# Patient Record
Sex: Male | Born: 1935 | Race: White | Hispanic: No | Smoking: Never smoker
Health system: Southern US, Community
[De-identification: ages and names within clinical notes are randomized; demographics above are authoritative.]

## PROBLEM LIST (undated history)

## (undated) DIAGNOSIS — R42 Dizziness and giddiness: Secondary | ICD-10-CM

## (undated) DIAGNOSIS — I10 Essential (primary) hypertension: Secondary | ICD-10-CM

## (undated) DIAGNOSIS — M1990 Unspecified osteoarthritis, unspecified site: Secondary | ICD-10-CM

## (undated) DIAGNOSIS — E785 Hyperlipidemia, unspecified: Secondary | ICD-10-CM

## (undated) DIAGNOSIS — E119 Type 2 diabetes mellitus without complications: Secondary | ICD-10-CM

## (undated) DIAGNOSIS — R05 Cough: Secondary | ICD-10-CM

## (undated) DIAGNOSIS — R059 Cough, unspecified: Secondary | ICD-10-CM

## (undated) DIAGNOSIS — Z87442 Personal history of urinary calculi: Secondary | ICD-10-CM

## (undated) DIAGNOSIS — C801 Malignant (primary) neoplasm, unspecified: Secondary | ICD-10-CM

## (undated) HISTORY — DX: Hyperlipidemia, unspecified: E78.5

## (undated) HISTORY — DX: Type 2 diabetes mellitus without complications: E11.9

## (undated) HISTORY — PX: OTHER SURGICAL HISTORY: SHX169

## (undated) HISTORY — DX: Essential (primary) hypertension: I10

---
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
## 2002-01-10 ENCOUNTER — Encounter: Payer: Self-pay | Admitting: Orthopedic Surgery

## 2002-01-10 ENCOUNTER — Ambulatory Visit (HOSPITAL_COMMUNITY): Admission: RE | Admit: 2002-01-10 | Discharge: 2002-01-10 | Payer: Self-pay | Admitting: Orthopedic Surgery

## 2002-12-02 ENCOUNTER — Encounter: Payer: Self-pay | Admitting: Orthopedic Surgery

## 2002-12-02 ENCOUNTER — Ambulatory Visit (HOSPITAL_COMMUNITY): Admission: RE | Admit: 2002-12-02 | Discharge: 2002-12-02 | Payer: Self-pay | Admitting: Orthopedic Surgery

## 2016-01-11 DIAGNOSIS — I10 Essential (primary) hypertension: Secondary | ICD-10-CM | POA: Insufficient documentation

## 2016-01-11 DIAGNOSIS — N183 Chronic kidney disease, stage 3 unspecified: Secondary | ICD-10-CM | POA: Insufficient documentation

## 2016-01-11 DIAGNOSIS — E1122 Type 2 diabetes mellitus with diabetic chronic kidney disease: Secondary | ICD-10-CM | POA: Insufficient documentation

## 2016-01-11 DIAGNOSIS — E538 Deficiency of other specified B group vitamins: Secondary | ICD-10-CM

## 2016-01-11 DIAGNOSIS — E782 Mixed hyperlipidemia: Secondary | ICD-10-CM | POA: Insufficient documentation

## 2016-01-11 DIAGNOSIS — I452 Bifascicular block: Secondary | ICD-10-CM

## 2016-01-11 HISTORY — DX: Bifascicular block: I45.2

## 2016-01-11 HISTORY — DX: Deficiency of other specified B group vitamins: E53.8

## 2016-01-11 HISTORY — DX: Type 2 diabetes mellitus with diabetic chronic kidney disease: E11.22

## 2016-01-11 HISTORY — DX: Mixed hyperlipidemia: E78.2

## 2016-01-11 HISTORY — DX: Chronic kidney disease, stage 3 unspecified: N18.30

## 2016-01-11 HISTORY — DX: Essential (primary) hypertension: I10

## 2016-03-22 DIAGNOSIS — E6609 Other obesity due to excess calories: Secondary | ICD-10-CM

## 2016-03-22 DIAGNOSIS — M109 Gout, unspecified: Secondary | ICD-10-CM

## 2016-03-22 DIAGNOSIS — R972 Elevated prostate specific antigen [PSA]: Secondary | ICD-10-CM

## 2016-03-22 DIAGNOSIS — N400 Enlarged prostate without lower urinary tract symptoms: Secondary | ICD-10-CM

## 2016-03-22 HISTORY — DX: Elevated prostate specific antigen (PSA): R97.20

## 2016-03-22 HISTORY — DX: Benign prostatic hyperplasia without lower urinary tract symptoms: N40.0

## 2016-03-22 HISTORY — DX: Gout, unspecified: M10.9

## 2016-03-22 HISTORY — DX: Other obesity due to excess calories: E66.09

## 2017-09-27 ENCOUNTER — Ambulatory Visit (INDEPENDENT_AMBULATORY_CARE_PROVIDER_SITE_OTHER): Payer: Medicare Other | Admitting: Cardiology

## 2017-09-27 ENCOUNTER — Encounter: Payer: Self-pay | Admitting: Cardiology

## 2017-09-27 VITALS — BP 138/76 | HR 64 | Ht 69.0 in | Wt 236.1 lb

## 2017-09-27 DIAGNOSIS — N183 Chronic kidney disease, stage 3 unspecified: Secondary | ICD-10-CM

## 2017-09-27 DIAGNOSIS — E782 Mixed hyperlipidemia: Secondary | ICD-10-CM

## 2017-09-27 DIAGNOSIS — E1122 Type 2 diabetes mellitus with diabetic chronic kidney disease: Secondary | ICD-10-CM

## 2017-09-27 DIAGNOSIS — I452 Bifascicular block: Secondary | ICD-10-CM | POA: Diagnosis not present

## 2017-09-27 DIAGNOSIS — R079 Chest pain, unspecified: Secondary | ICD-10-CM | POA: Diagnosis not present

## 2017-09-27 DIAGNOSIS — R0789 Other chest pain: Secondary | ICD-10-CM

## 2017-09-27 DIAGNOSIS — I10 Essential (primary) hypertension: Secondary | ICD-10-CM

## 2017-09-27 HISTORY — DX: Other chest pain: R07.89

## 2017-09-27 NOTE — Patient Instructions (Signed)
Medication Instructions:  Your physician recommends that you continue on your current medications as directed. Please refer to the Current Medication list given to you today.  1. Avoid all over-the-counter antihistamines except Claritin/Loratadine and Zyrtec/Cetrizine. 2. Avoid all combination including cold sinus allergies flu decongestant and sleep medications 3. You can use Robitussin DM Mucinex and Mucinex DM for cough. 4. can use Tylenol aspirin ibuprofen and naproxen but no combinations such as sleep or sinus.  Labwork: None   Testing/Procedures: Your physician has requested that you have a lexiscan myoview. For further information please visit [email or web address]. Please follow instruction sheet, as given.  Please report to [address] [place], [place] the day of your testing.   Your physician has requested that you have an echocardiogram. Echocardiography is a painless test that uses sound waves to create images of your heart. It provides your doctor with information about the size and shape of your heart and how well your heart's chambers and valves are working. This procedure takes approximately one hour. There are no restrictions for this procedure.  Please report to [address] [place], [place] the day of your testing.   Follow-Up: Your physician recommends that you schedule a follow-up appointment in: 1 month   Any Other Special Instructions Will Be Listed Below (If Applicable).  Please note that any paperwork needing to be filled out by the provider will need to be addressed at the front desk prior to seeing the provider. Please note that any paperwork FMLA, Disability or other documents regarding health condition is subject to a $25.00 charge that must be received prior to completion of paperwork in the form of a money order or check.    If you need a refill on your cardiac medications before your next appointment, please call your  pharmacy.

## 2017-09-27 NOTE — Progress Notes (Signed)
Cardiology Consultation:    Date:  09/27/2017   ID:  Gwyndolyn Saxon Vita, DOB 01-Jun-1931, MRN [id number]  PCP:  Raina Mina., MD  Cardiologist:  Jenne Campus, MD   Referring MD: Raina Mina., MD   Chief Complaint  Patient presents with  . New Patient (Initial Visit)  I need hip surgery  History of Present Illness:    Phillip Neal is a 83 y.o. male who is being seen today for the evaluation of cardiac issue before hip surgery at the request of Raina Mina., MD.  Patient does have multiple risk factors for coronary artery disease.  Namely dyslipidemia with intolerance to statin, diabetes, chronic kidney disease, hypertension.  He required hip surgery he is here to talk about risk factors and evaluation before that.  Denies having any typical cardiac symptoms but anytime he works hard he may feel his heart spitting up some does describe some uneasy sensation in the chest he does not use expression chest pain presently is a sensation that happened when he try to work hard.  This been going on for years anything is related to lack of drinking fluids when he does it which she could be right but obviously with his risk factors for coronary artery disease we need to consider evaluating for CAD.  Past Medical History:  Diagnosis Date  . Chronic kidney disease   . Diabetes mellitus without complication ([place])   . Hyperlipidemia   . Hypertension     Past Surgical History:  Procedure Laterality Date  . HERNIA REPAIR      Current Medications: Current Meds  Medication Sig  . aspirin 81 MG chewable tablet Chew 81 mg by mouth daily.  . clotrimazole-betamethasone (LOTRISONE) cream Apply 1 application topically 2 (two) times daily.  . Coenzyme Q10 (CO Q 10) 10 MG CAPS Take 10 mg by mouth 2 (two) times daily.   . hydrochlorothiazide (HYDRODIURIL) 12.5 MG tablet Take 1 tablet by mouth daily.  Marland Kitchen lisinopril (PRINIVIL,ZESTRIL) 40 MG tablet Take 1 tablet by mouth daily.  .  metFORMIN (GLUCOPHAGE-XR) 500 MG 24 hr tablet Take 2 tablets by mouth 2 (two) times daily.  . vitamin B-12 (CYANOCOBALAMIN) 1000 MCG tablet Take 1 tablet by mouth daily.     Allergies:   Ciprofloxacin; Statins; and Sulfamethoxazole   Social History   Social History  . Marital status: Married    Spouse name: N/A  . Number of children: N/A  . Years of education: N/A   Social History Main Topics  . Smoking status: Never Smoker  . Smokeless tobacco: Never Used  . Alcohol use No  . Drug use: No  . Sexual activity: Not Asked   Other Topics Concern  . None   Social History Narrative  . None     Family History: [name] family history includes Heart failure in his mother. ROS:   Please see the history of present illness.    All 14 point review of systems negative except as described per history of present illness.  EKGs/Labs/Other Studies Reviewed:    The following studies were reviewed today:     Recent Labs: No results found for requested labs within last 8760 hours.  Recent Lipid Panel No results found for: CHOL, TRIG, HDL, CHOLHDL, VLDL, LDLCALC, LDLDIRECT  Physical Exam:    VS:  BP 138/76   Pulse 64   Ht 5\' 9"  (1.753 m)   Wt 236 lb 1.9 oz (107.1 kg)   SpO2 95%  BMI 34.87 kg/m     Wt Readings from Last 3 Encounters:  09/27/17 236 lb 1.9 oz (107.1 kg)     GEN:  Well nourished, well developed in no acute distress HEENT: Normal NECK: No JVD; No carotid bruits LYMPHATICS: No lymphadenopathy CARDIAC: RRR, no murmurs, no rubs, no gallops RESPIRATORY:  Clear to auscultation without rales, wheezing or rhonchi  ABDOMEN: Soft, non-tender, non-distended MUSCULOSKELETAL:  No edema; No deformity  SKIN: Warm and dry NEUROLOGIC:  Alert and oriented x 3 PSYCHIATRIC:  Normal affect   ASSESSMENT:    1. Bifascicular bundle branch block   2. Essential hypertension   3. Diabetes mellitus with stage 3 chronic kidney disease ([place])   4. Mixed hyperlipidemia     5. Atypical chest pain    PLAN:    In order of problems listed above:  1. Bifascicular bundle branch block: We will do EKG to confirm the rhythm.  I would ask him to have echo to check left ventricular ejection fraction 2. Essential hypertension: Well-controlled continue present management. 3. Diabetes: Well controlled I will retrieve laboratory test from primary care physician 4. Dyslipidemia: He was always told that his course was good but he was transported because of diabetes and he was not able to tolerate it.  Will retrieve results of his fasting lipid profile 5. Atypical chest pain: Stress test will be done as a part of evaluation before surgery.   Medication Adjustments/Labs and Tests Ordered: Current medicines are reviewed at length with the patient today.  Concerns regarding medicines are outlined above.  No orders of the defined types were placed in this encounter.  No orders of the defined types were placed in this encounter.   Signed, Park Liter, MD, [facility]. 09/27/2017 10:23 AM    [facility]

## 2017-10-02 ENCOUNTER — Telehealth (HOSPITAL_COMMUNITY): Payer: Self-pay | Admitting: *Deleted

## 2017-10-02 NOTE — Telephone Encounter (Signed)
Patient given detailed instructions per Myocardial Perfusion Study Information Sheet for the test on 10/05/17 at 1000. Patient notified to arrive 15 minutes early and that it is imperative to arrive on time for appointment to keep from having the test rescheduled.  If you need to cancel or reschedule your appointment, please call the office within 24 hours of your appointment. . Patient verbalized understanding.Phillip Neal, [name]

## 2017-10-05 ENCOUNTER — Ambulatory Visit (HOSPITAL_COMMUNITY): Payer: Medicare Other | Attending: Cardiology

## 2017-10-05 ENCOUNTER — Ambulatory Visit (HOSPITAL_BASED_OUTPATIENT_CLINIC_OR_DEPARTMENT_OTHER): Payer: Medicare Other

## 2017-10-05 ENCOUNTER — Other Ambulatory Visit: Payer: Self-pay

## 2017-10-05 DIAGNOSIS — E782 Mixed hyperlipidemia: Secondary | ICD-10-CM

## 2017-10-05 DIAGNOSIS — I77810 Thoracic aortic ectasia: Secondary | ICD-10-CM | POA: Diagnosis not present

## 2017-10-05 DIAGNOSIS — N189 Chronic kidney disease, unspecified: Secondary | ICD-10-CM | POA: Insufficient documentation

## 2017-10-05 DIAGNOSIS — Z8249 Family history of ischemic heart disease and other diseases of the circulatory system: Secondary | ICD-10-CM | POA: Insufficient documentation

## 2017-10-05 DIAGNOSIS — R9439 Abnormal result of other cardiovascular function study: Secondary | ICD-10-CM | POA: Insufficient documentation

## 2017-10-05 DIAGNOSIS — E785 Hyperlipidemia, unspecified: Secondary | ICD-10-CM | POA: Insufficient documentation

## 2017-10-05 DIAGNOSIS — R079 Chest pain, unspecified: Secondary | ICD-10-CM

## 2017-10-05 DIAGNOSIS — E1122 Type 2 diabetes mellitus with diabetic chronic kidney disease: Secondary | ICD-10-CM | POA: Diagnosis not present

## 2017-10-05 DIAGNOSIS — Z01818 Encounter for other preprocedural examination: Secondary | ICD-10-CM | POA: Diagnosis not present

## 2017-10-05 DIAGNOSIS — I129 Hypertensive chronic kidney disease with stage 1 through stage 4 chronic kidney disease, or unspecified chronic kidney disease: Secondary | ICD-10-CM | POA: Insufficient documentation

## 2017-10-05 DIAGNOSIS — I4510 Unspecified right bundle-branch block: Secondary | ICD-10-CM | POA: Insufficient documentation

## 2017-10-05 DIAGNOSIS — R0789 Other chest pain: Secondary | ICD-10-CM | POA: Insufficient documentation

## 2017-10-05 LAB — MYOCARDIAL PERFUSION IMAGING
CHL CUP NUCLEAR SDS: 0
CHL CUP NUCLEAR SSS: 17
CSEPPHR: 75 {beats}/min
LHR: 0.3
LV dias vol: 109 mL (ref 62–150)
LV sys vol: 52 mL
Rest HR: 63 {beats}/min
SRS: 17
TID: 0.88

## 2017-10-05 LAB — ECHOCARDIOGRAM COMPLETE
Height: 69 in
WEIGHTICAEL: 3776 [oz_av]

## 2017-10-05 MED ORDER — REGADENOSON 0.4 MG/5ML IV SOLN
0.4000 mg | Freq: Once | INTRAVENOUS | Status: AC
  Administered 2017-10-05: 0.4 mg via INTRAVENOUS

## 2017-10-05 MED ORDER — TECHNETIUM TC 99M TETROFOSMIN IV KIT
29.0000 | PACK | Freq: Once | INTRAVENOUS | Status: AC | PRN
  Administered 2017-10-05: 29 via INTRAVENOUS
  Filled 2017-10-05: qty 29

## 2017-10-05 MED ORDER — TECHNETIUM TC 99M TETROFOSMIN IV KIT
10.2000 | PACK | Freq: Once | INTRAVENOUS | Status: AC | PRN
  Administered 2017-10-05: 10.2 via INTRAVENOUS
  Filled 2017-10-05: qty 11

## 2017-10-17 MED ORDER — NITROGLYCERIN 0.4 MG SL SUBL
SUBLINGUAL_TABLET | SUBLINGUAL | Status: AC
  Filled 2017-10-17: qty 1

## 2017-10-17 MED ORDER — METOPROLOL TARTRATE 5 MG/5ML IV SOLN
INTRAVENOUS | Status: AC
  Filled 2017-10-17: qty 5

## 2017-10-27 ENCOUNTER — Encounter: Payer: Self-pay | Admitting: Cardiology

## 2017-10-27 ENCOUNTER — Ambulatory Visit (INDEPENDENT_AMBULATORY_CARE_PROVIDER_SITE_OTHER): Payer: Medicare Other | Admitting: Cardiology

## 2017-10-27 VITALS — BP 130/58 | HR 64 | Resp 12 | Ht 69.0 in | Wt 231.8 lb

## 2017-10-27 DIAGNOSIS — N183 Chronic kidney disease, stage 3 unspecified: Secondary | ICD-10-CM

## 2017-10-27 DIAGNOSIS — I452 Bifascicular block: Secondary | ICD-10-CM

## 2017-10-27 DIAGNOSIS — E1122 Type 2 diabetes mellitus with diabetic chronic kidney disease: Secondary | ICD-10-CM | POA: Diagnosis not present

## 2017-10-27 DIAGNOSIS — E782 Mixed hyperlipidemia: Secondary | ICD-10-CM

## 2017-10-27 DIAGNOSIS — I10 Essential (primary) hypertension: Secondary | ICD-10-CM | POA: Diagnosis not present

## 2017-10-27 DIAGNOSIS — R0789 Other chest pain: Secondary | ICD-10-CM | POA: Diagnosis not present

## 2017-10-27 MED ORDER — EZETIMIBE 10 MG PO TABS: 10.0000 mg | ORAL_TABLET | Freq: Every day | ORAL | 3 refills | Status: DC

## 2017-10-27 NOTE — Progress Notes (Signed)
Cardiology Office Note:    Date:  10/27/2017   ID:  Phillip Neal, DOB 1931/03/18, MRN [id number]  PCP:  Raina Mina., MD  Cardiologist:  Jenne Campus, MD    Referring MD: Raina Mina., MD   Chief Complaint  Patient presents with  . Follow up on testing  Doing well  History of Present Illness:    Phillip Neal is a 83 y.o. male with multiple risk factors for coronary artery disease.  He was sent to [place] for evaluation before scheduled hip replacement surgery.  He did have echocardiogram which showed normal left ventricular ejection fraction, he also had a stress test which showed no evidence of ischemia there is some questionable old myocardial infarction on the stress test however again echo showed normal left ventricular ejection fraction.  He does have bifascicular block luckily he is asymptomatic there is no syncope dizziness or passing out.  His ability to exercise is limited because of chronic hip pain.  He used to be very active used to go to gym on the regular basis but because of pain in his knee cannot do it but he tells me that just a few days ago he was blowing leaves for about 4 hours and had no difficulty doing it.  I think what we need to do now is to reorientate herself and basically try to modify his risk factors for coronary artery disease.  He is intolerant to statin however I think he can benefit from Zetia I will give him prescription for 10 mg daily.  Past Medical History:  Diagnosis Date  . Chronic kidney disease   . Diabetes mellitus without complication ([place])   . Hyperlipidemia   . Hypertension     Past Surgical History:  Procedure Laterality Date  . HERNIA REPAIR      Current Medications: Current Meds  Medication Sig  . aspirin 81 MG chewable tablet Chew 81 mg by mouth daily.  . clotrimazole-betamethasone (LOTRISONE) cream Apply 1 application topically 2 (two) times daily.  . Coenzyme Q10 (CO Q 10) 10 MG CAPS Take 10 mg by mouth 2  (two) times daily.   . hydrochlorothiazide (HYDRODIURIL) 12.5 MG tablet Take 1 tablet by mouth daily.  Marland Kitchen lisinopril (PRINIVIL,ZESTRIL) 40 MG tablet Take 1 tablet by mouth daily.  . metFORMIN (GLUCOPHAGE-XR) 500 MG 24 hr tablet Take 2 tablets by mouth 2 (two) times daily.  . vitamin B-12 (CYANOCOBALAMIN) 1000 MCG tablet Take 1 tablet by mouth daily.     Allergies:   Ciprofloxacin; Statins; and Sulfamethoxazole   Social History   Socioeconomic History  . Marital status: Married    Spouse name: None  . Number of children: None  . Years of education: None  . Highest education level: None  Social Needs  . Financial resource strain: None  . Food insecurity - worry: None  . Food insecurity - inability: None  . Transportation needs - medical: None  . Transportation needs - non-medical: None  Occupational History  . None  Tobacco Use  . Smoking status: Never Smoker  . Smokeless tobacco: Never Used  Substance and Sexual Activity  . Alcohol use: No  . Drug use: No  . Sexual activity: None  Other Topics Concern  . None  Social History Narrative  . None     Family History: [name] family history includes Heart failure in his mother. ROS:   Please see the history of present illness.    All 14 point review of  systems negative except as described per history of present illness  EKGs/Labs/Other Studies Reviewed:      Recent Labs: No results found for requested labs within last 8760 hours.  Recent Lipid Panel No results found for: CHOL, TRIG, HDL, CHOLHDL, VLDL, LDLCALC, LDLDIRECT  Physical Exam:    VS:  BP (!) 130/58   Pulse 64   Resp 12   Ht 5\' 9"  (1.753 m)   Wt 231 lb 12.8 oz (105.1 kg)   BMI 34.23 kg/m     Wt Readings from Last 3 Encounters:  10/27/17 231 lb 12.8 oz (105.1 kg)  10/05/17 236 lb (107 kg)  09/27/17 236 lb 1.9 oz (107.1 kg)     GEN:  Well nourished, well developed in no acute distress HEENT: Normal NECK: No JVD; No carotid  bruits LYMPHATICS: No lymphadenopathy CARDIAC: RRR, no murmurs, no rubs, no gallops RESPIRATORY:  Clear to auscultation without rales, wheezing or rhonchi  ABDOMEN: Soft, non-tender, non-distended MUSCULOSKELETAL:  No edema; No deformity  SKIN: Warm and dry LOWER EXTREMITIES: no swelling NEUROLOGIC:  Alert and oriented x 3 PSYCHIATRIC:  Normal affect   ASSESSMENT:    1. Bifascicular bundle branch block   2. Essential hypertension   3. Diabetes mellitus with stage 3 chronic kidney disease ([place])   4. Mixed hyperlipidemia   5. Atypical chest pain    PLAN:    In order of problems listed above:  1. Bifascicular bundle branch block: I will ask him to wear Holter monitor to make sure there is no significant worsening of this problem especially during the night. 2. Essential hypertension: Doing well from that point of view we will continue present management. 3. Diabetes: Following by primary care physician apparently stable doing well. 4. His lipidemia will initiate Zetia 10 mg daily. 5. Atypical chest pain denies having any, stress test negative.  If Holter monitor comes without significant abnormality he will be acceptable risk for surgery from cardiac standpoint of view.   Medication Adjustments/Labs and Tests Ordered: Current medicines are reviewed at length with the patient today.  Concerns regarding medicines are outlined above.  No orders of the defined types were placed in this encounter.  Medication changes: No orders of the defined types were placed in this encounter.   Signed, Park Liter, MD, [facility] 10/27/2017 10:06 AM    [place]

## 2017-10-27 NOTE — Patient Instructions (Addendum)
Medication Instructions:  Your physician has recommended you make the following change in your medication:  1) Start taking Zetia 10 mg daily. Dr. Agustin Cree has sent this to your pharmacy  Labwork: None ordered  Testing/Procedures: Your physician has recommended that you wear a holter monitor. Holter monitors are medical devices that record the heart's electrical activity. Doctors most often use these monitors to diagnose arrhythmias. Arrhythmias are problems with the speed or rhythm of the heartbeat. The monitor is a small, portable device. You can wear one while you do your normal daily activities. This is usually used to diagnose what is causing palpitations/syncope (passing out).  You will wear this monitor for 24 hours.  Follow-Up: Your physician recommends that you schedule a follow-up appointment in: 3 months with Dr. Agustin Cree   Any Other Special Instructions Will Be Listed Below (If Applicable).     If you need a refill on your cardiac medications before your next appointment, please call your pharmacy.

## 2017-11-10 ENCOUNTER — Ambulatory Visit: Payer: Self-pay | Admitting: Orthopedic Surgery
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
## 2017-11-27 ENCOUNTER — Other Ambulatory Visit (HOSPITAL_COMMUNITY): Payer: Self-pay | Admitting: Emergency Medicine

## 2017-11-27 NOTE — Progress Notes (Signed)
LOV CARDIOLOGY DR Jenne Campus 10-27-17 Epic  STRESS TEST 10-05-17 Epic  ECHO 10-05-17 Epic   HOLTER MONITOR RESULT 10-31-17 Epic  EKG 09-22-17 ON CHART [place]  LOV DR GREG GRISSON 10-31-17 Epic  LOV DR Marya Amsler GRISSO 09-22-17 ON CHART/EPIC

## 2017-11-27 NOTE — Patient Instructions (Addendum)
Phillip Neal  11/27/2017   Your procedure is scheduled on: 12-06-17  Report to [facility] Main  Entrance    Report to admitting at 9:30AM   Call this number if you have problems the morning of surgery [phone]    Remember: ONLY 1 PERSON MAY GO WITH YOU TO SHORT STAY TO GET  READY MORNING OF YOUR SURGERY.  Do not eat food or drink liquids :After Midnight.     Take these medicines the morning of surgery with A SIP OF WATER: NONE                                You may not have any metal on your body including hair pins and              piercings  Do not wear jewelry, make-up, lotions, powders or perfumes, deodorant                 Men may shave face and neck.   Do not bring valuables to the hospital. [place].  Contacts, dentures or bridgework may not be worn into surgery.  Leave suitcase in the car. After surgery it may be brought to your room.                 Please read over the following fact sheets you were given: _____________________________________________________________________             How to Manage Your Diabetes Before and After Surgery  Why is it important to control my blood sugar before and after surgery? . Improving blood sugar levels before and after surgery helps healing and can limit problems. . A way of improving blood sugar control is eating a healthy diet by: o  Eating less sugar and carbohydrates o  Increasing activity/exercise o  Talking with your doctor about reaching your blood sugar goals . High blood sugars (greater than 180 mg/dL) can raise your risk of infections and slow your recovery, so you will need to focus on controlling your diabetes during the weeks before surgery. . Make sure that the doctor who takes care of your diabetes knows about your planned surgery including the date and location.  How do I manage my blood sugar before surgery? . Check your  blood sugar at least 4 times a day, starting 2 days before surgery, to make sure that the level is not too high or low. o Check your blood sugar the morning of your surgery when you wake up and every 2 hours until you get to the Short Stay unit. . If your blood sugar is less than 70 mg/dL, you will need to treat for low blood sugar: o Do not take insulin. o Treat a low blood sugar (less than 70 mg/dL) with  cup of clear juice (cranberry or apple), 4 glucose tablets, OR glucose gel. o Recheck blood sugar in 15 minutes after treatment (to make sure it is greater than 70 mg/dL). If your blood sugar is not greater than 70 mg/dL on recheck, call [phone] for further instructions. . Report your blood sugar to the short stay nurse when you get to Short Stay.  . If you are admitted to the hospital after surgery: o Your  blood sugar will be checked by the staff and you will probably be given insulin after surgery (instead of oral diabetes medicines) to make sure you have good blood sugar levels. o The goal for blood sugar control after surgery is 80-180 mg/dL.   WHAT DO I DO ABOUT MY DIABETES MEDICATION?   . THE DAY BEFORE SURGERY, TAKE METFORMIN AS NORMAL       . THE MORNING OF SURGERY, DO NOT TAKE ANY DIABETIC MEDICATIONS !!   Patient Signature:  Date:   Nurse Signature:  Date:   Reviewed and Endorsed by [facility] Patient Education Committee, August 2015    [facility] - Preparing for Surgery Before surgery, you can play an important role.  Because skin is not sterile, your skin needs to be as free of germs as possible.  You can reduce the number of germs on your skin by washing with CHG (chlorahexidine gluconate) soap before surgery.  CHG is an antiseptic cleaner which kills germs and bonds with the skin to continue killing germs even after washing. Please DO NOT use if you have an allergy to CHG or antibacterial soaps.  If your skin becomes reddened/irritated stop using the CHG and  inform your nurse when you arrive at Short Stay. Do not shave (including legs and underarms) for at least 48 hours prior to the first CHG shower.  You may shave your face/neck. Please follow these instructions carefully:  1.  Shower with CHG Soap the night before surgery and the  morning of Surgery.  2.  If you choose to wash your hair, wash your hair first as usual with your  normal  shampoo.  3.  After you shampoo, rinse your hair and body thoroughly to remove the  shampoo.                           4.  Use CHG as you would any other liquid soap.  You can apply chg directly  to the skin and wash                       Gently with a scrungie or clean washcloth.  5.  Apply the CHG Soap to your body ONLY FROM THE NECK DOWN.   Do not use on face/ open                           Wound or open sores. Avoid contact with eyes, ears mouth and genitals (private parts).                       Wash face,  Genitals (private parts) with your normal soap.             6.  Wash thoroughly, paying special attention to the area where your surgery  will be performed.  7.  Thoroughly rinse your body with warm water from the neck down.  8.  DO NOT shower/wash with your normal soap after using and rinsing off  the CHG Soap.                9.  Pat yourself dry with a clean towel.            10.  Wear clean pajamas.            11.  Place clean sheets on your bed the night of your  first shower and do not  sleep with pets. Day of Surgery : Do not apply any lotions/deodorants the morning of surgery.  Please wear clean clothes to the hospital/surgery center.  FAILURE TO FOLLOW THESE INSTRUCTIONS MAY RESULT IN THE CANCELLATION OF YOUR SURGERY PATIENT SIGNATURE_________________________________  NURSE SIGNATURE__________________________________  ________________________________________________________________________   Phillip Neal  An incentive spirometer is a tool that can help keep your lungs clear and  active. This tool measures how well you are filling your lungs with each breath. Taking long deep breaths may help reverse or decrease the chance of developing breathing (pulmonary) problems (especially infection) following:  A long period of time when you are unable to move or be active. BEFORE THE PROCEDURE   If the spirometer includes an indicator to show your best effort, your nurse or respiratory therapist will set it to a desired goal.  If possible, sit up straight or lean slightly forward. Try not to slouch.  Hold the incentive spirometer in an upright position. INSTRUCTIONS FOR USE  1. Sit on the edge of your bed if possible, or sit up as far as you can in bed or on a chair. 2. Hold the incentive spirometer in an upright position. 3. Breathe out normally. 4. Place the mouthpiece in your mouth and seal your lips tightly around it. 5. Breathe in slowly and as deeply as possible, raising the piston or the ball toward the top of the column. 6. Hold your breath for 3-5 seconds or for as long as possible. Allow the piston or ball to fall to the bottom of the column. 7. Remove the mouthpiece from your mouth and breathe out normally. 8. Rest for a few seconds and repeat Steps 1 through 7 at least 10 times every 1-2 hours when you are awake. Take your time and take a few normal breaths between deep breaths. 9. The spirometer may include an indicator to show your best effort. Use the indicator as a goal to work toward during each repetition. 10. After each set of 10 deep breaths, practice coughing to be sure your lungs are clear. If you have an incision (the cut made at the time of surgery), support your incision when coughing by placing a pillow or rolled up towels firmly against it. Once you are able to get out of bed, walk around indoors and cough well. You may stop using the incentive spirometer when instructed by your caregiver.  RISKS AND COMPLICATIONS  Take your time so you do not get  dizzy or light-headed.  If you are in pain, you may need to take or ask for pain medication before doing incentive spirometry. It is harder to take a deep breath if you are having pain. AFTER USE  Rest and breathe slowly and easily.  It can be helpful to keep track of a log of your progress. Your caregiver can provide you with a simple table to help with this. If you are using the spirometer at home, follow these instructions: [place] IF:   You are having difficultly using the spirometer.  You have trouble using the spirometer as often as instructed.  Your pain medication is not giving enough relief while using the spirometer.  You develop fever of 100.5 F (38.1 C) or higher. SEEK IMMEDIATE MEDICAL CARE IF:   You cough up bloody sputum that had not been present before.  You develop fever of 102 F (38.9 C) or greater.  You develop worsening pain at or near the incision  site. MAKE SURE YOU:   Understand these instructions.  Will watch your condition.  Will get help right away if you are not doing well or get worse. Document Released: 03/27/2007 Document Revised: 02/06/2012 Document Reviewed: 05/28/2007 ExitCare Patient Information 2014 ExitCare, [place].   ________________________________________________________________________  WHAT IS A BLOOD TRANSFUSION? Blood Transfusion Information  A transfusion is the replacement of blood or some of its parts. Blood is made up of multiple cells which provide different functions.  Red blood cells carry oxygen and are used for blood loss replacement.  White blood cells fight against infection.  Platelets control bleeding.  Plasma helps clot blood.  Other blood products are available for specialized needs, such as hemophilia or other clotting disorders. BEFORE THE TRANSFUSION  Who gives blood for transfusions?   Healthy volunteers who are fully evaluated to make sure their blood is safe. This is blood bank  blood. Transfusion therapy is the safest it has ever been in the practice of medicine. Before blood is taken from a donor, a complete history is taken to make sure that person has no history of diseases nor engages in risky social behavior (examples are intravenous drug use or sexual activity with multiple partners). The donor's travel history is screened to minimize risk of transmitting infections, such as malaria. The donated blood is tested for signs of infectious diseases, such as HIV and hepatitis. The blood is then tested to be sure it is compatible with you in order to minimize the chance of a transfusion reaction. If you or a relative donates blood, this is often done in anticipation of surgery and is not appropriate for emergency situations. It takes many days to process the donated blood. RISKS AND COMPLICATIONS Although transfusion therapy is very safe and saves many lives, the main dangers of transfusion include:   Getting an infectious disease.  Developing a transfusion reaction. This is an allergic reaction to something in the blood you were given. Every precaution is taken to prevent this. The decision to have a blood transfusion has been considered carefully by your caregiver before blood is given. Blood is not given unless the benefits outweigh the risks. AFTER THE TRANSFUSION  Right after receiving a blood transfusion, you will usually feel much better and more energetic. This is especially true if your red blood cells have gotten low (anemic). The transfusion raises the level of the red blood cells which carry oxygen, and this usually causes an energy increase.  The nurse administering the transfusion will monitor you carefully for complications. HOME CARE INSTRUCTIONS  No special instructions are needed after a transfusion. You may find your energy is better. Speak with your caregiver about any limitations on activity for underlying diseases you may have. SEEK MEDICAL CARE IF:    Your condition is not improving after your transfusion.  You develop redness or irritation at the intravenous (IV) site. SEEK IMMEDIATE MEDICAL CARE IF:  Any of the following symptoms occur over the next 12 hours:  Shaking chills.  You have a temperature by mouth above 102 F (38.9 C), not controlled by medicine.  Chest, back, or muscle pain.  People around you feel you are not acting correctly or are confused.  Shortness of breath or difficulty breathing.  Dizziness and fainting.  You get a rash or develop hives.  You have a decrease in urine output.  Your urine turns a dark color or changes to pink, red, or brown. Any of the following symptoms occur over the next 10  days:  You have a temperature by mouth above 102 F (38.9 C), not controlled by medicine.  Shortness of breath.  Weakness after normal activity.  The white part of the eye turns yellow (jaundice).  You have a decrease in the amount of urine or are urinating less often.  Your urine turns a dark color or changes to pink, red, or brown. Document Released: 11/11/2000 Document Revised: 02/06/2012 Document Reviewed: 06/30/2008 [facility] Patient Information 2014 [place], [place].  _______________________________________________________________________

## 2017-11-30 ENCOUNTER — Encounter (HOSPITAL_COMMUNITY)
Admission: RE | Admit: 2017-11-30 | Discharge: 2017-11-30 | Disposition: A | Discharge status: Home / Self Care | Payer: Medicare Other | Source: Ambulatory Visit | Attending: Orthopedic Surgery | Admitting: Orthopedic Surgery

## 2018-01-19 ENCOUNTER — Ambulatory Visit: Payer: Self-pay | Admitting: Orthopedic Surgery
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
## 2018-01-24 NOTE — Progress Notes (Signed)
10-31-17 [place] Monitor  10-27-17 (Epic) Cardiac Clearance from Dr. Agustin Cree  09-22-17 (Epic) EKG  10-05-17 (Epic) ECHO, Stress

## 2018-01-24 NOTE — Patient Instructions (Addendum)
Phillip Neal  01/24/2018   Your procedure is scheduled on: 01-31-18   Report to [facility] Main  Entrance Report to Admitting at 11:00 AM    Call this number if you have problems the morning of surgery [phone]   Remember: Do not eat food or drink liquids :After Midnight. You may have a Clear Liquid Diet from Midnight until 7:30 AM. After 7:30 AM, nothing until after surgery.     CLEAR LIQUID DIET   Foods Allowed                                                                     Foods Excluded  Coffee and tea, regular and decaf                             liquids that you cannot  Plain Jell-O in any flavor                                             see through such as: Fruit ices (not with fruit pulp)                                     milk, soups, orange juice  Iced Popsicles                                    All solid food Carbonated beverages, regular and diet                                    Cranberry, grape and apple juices Sports drinks like Gatorade Lightly seasoned clear broth or consume(fat free) Sugar, honey syrup  Sample Menu Breakfast                                Lunch                                     Supper Cranberry juice                    Beef broth                            Chicken broth Jell-O                                     Grape juice                           Apple juice  Coffee or tea                        Jell-O                                      Popsicle                                                Coffee or tea                        Coffee or tea  _____________________________________________________________________     Take these medicines the morning of surgery with A SIP OF WATER: None                                You may not have any metal on your body including hair pins and              piercings  Do not wear jewelry, lotions, powders or deodorant             Men may shave face and  neck.   Do not bring valuables to the hospital. [place].  Contacts, dentures or bridgework may not be worn into surgery.  Leave suitcase in the car. After surgery it may be brought to your room.                Please read over the following fact sheets you were given: _____________________________________________________________________ How to Manage Your Diabetes Before and After Surgery  Why is it important to control my blood sugar before and after surgery? . Improving blood sugar levels before and after surgery helps healing and can limit problems. . A way of improving blood sugar control is eating a healthy diet by: o  Eating less sugar and carbohydrates o  Increasing activity/exercise o  Talking with your doctor about reaching your blood sugar goals . High blood sugars (greater than 180 mg/dL) can raise your risk of infections and slow your recovery, so you will need to focus on controlling your diabetes during the weeks before surgery. . Make sure that the doctor who takes care of your diabetes knows about your planned surgery including the date and location.  How do I manage my blood sugar before surgery? . Check your blood sugar at least 4 times a day, starting 2 days before surgery, to make sure that the level is not too high or low. o Check your blood sugar the morning of your surgery when you wake up and every 2 hours until you get to the Short Stay unit. . If your blood sugar is less than 70 mg/dL, you will need to treat for low blood sugar: o Do not take insulin. o Treat a low blood sugar (less than 70 mg/dL) with  cup of clear juice (cranberry or apple), 4 glucose tablets, OR glucose gel. o Recheck blood sugar in 15 minutes after treatment (to make sure it is greater than 70 mg/dL). If your blood sugar is not greater than 70 mg/dL on  recheck, call [phone] for further instructions. . Report your blood sugar to the  short stay nurse when you get to Short Stay.  . If you are admitted to the hospital after surgery: o Your blood sugar will be checked by the staff and you will probably be given insulin after surgery (instead of oral diabetes medicines) to make sure you have good blood sugar levels. o The goal for blood sugar control after surgery is 80-180 mg/dL.   WHAT DO I DO ABOUT MY DIABETES MEDICATION?  Marland Kitchen Do not take oral diabetes medicines (pills) the morning of surgery.  . THE DAY BEFORE SURGERY, take your usual dose of Metformin        Patient Signature:  Date:   Nurse Signature:  Date:   Reviewed and Endorsed by [facility] Patient Education Committee, August 2015         [facility] - Preparing for Surgery Before surgery, you can play an important role.  Because skin is not sterile, your skin needs to be as free of germs as possible.  You can reduce the number of germs on your skin by washing with CHG (chlorahexidine gluconate) soap before surgery.  CHG is an antiseptic cleaner which kills germs and bonds with the skin to continue killing germs even after washing. Please DO NOT use if you have an allergy to CHG or antibacterial soaps.  If your skin becomes reddened/irritated stop using the CHG and inform your nurse when you arrive at Short Stay. Do not shave (including legs and underarms) for at least 48 hours prior to the first CHG shower.  You may shave your face/neck. Please follow these instructions carefully:  1.  Shower with CHG Soap the night before surgery and the  morning of Surgery.  2.  If you choose to wash your hair, wash your hair first as usual with your  normal  shampoo.  3.  After you shampoo, rinse your hair and body thoroughly to remove the  shampoo.                           4.  Use CHG as you would any other liquid soap.  You can apply chg directly  to the skin and wash                       Gently with a scrungie or clean washcloth.  5.  Apply the CHG Soap to your body  ONLY FROM THE NECK DOWN.   Do not use on face/ open                           Wound or open sores. Avoid contact with eyes, ears mouth and genitals (private parts).                       Wash face,  Genitals (private parts) with your normal soap.             6.  Wash thoroughly, paying special attention to the area where your surgery  will be performed.  7.  Thoroughly rinse your body with warm water from the neck down.  8.  DO NOT shower/wash with your normal soap after using and rinsing off  the CHG Soap.                9.  Pat yourself dry with  a clean towel.            10.  Wear clean pajamas.            11.  Place clean sheets on your bed the night of your first shower and do not  sleep with pets. Day of Surgery : Do not apply any lotions/deodorants the morning of surgery.  Please wear clean clothes to the hospital/surgery center.  FAILURE TO FOLLOW THESE INSTRUCTIONS MAY RESULT IN THE CANCELLATION OF YOUR SURGERY PATIENT SIGNATURE_________________________________  NURSE SIGNATURE__________________________________  ________________________________________________________________________   Adam Phenix  An incentive spirometer is a tool that can help keep your lungs clear and active. This tool measures how well you are filling your lungs with each breath. Taking long deep breaths may help reverse or decrease the chance of developing breathing (pulmonary) problems (especially infection) following:  A long period of time when you are unable to move or be active. BEFORE THE PROCEDURE   If the spirometer includes an indicator to show your best effort, your nurse or respiratory therapist will set it to a desired goal.  If possible, sit up straight or lean slightly forward. Try not to slouch.  Hold the incentive spirometer in an upright position. INSTRUCTIONS FOR USE  1. Sit on the edge of your bed if possible, or sit up as far as you can in bed or on a chair. 2. Hold the  incentive spirometer in an upright position. 3. Breathe out normally. 4. Place the mouthpiece in your mouth and seal your lips tightly around it. 5. Breathe in slowly and as deeply as possible, raising the piston or the ball toward the top of the column. 6. Hold your breath for 3-5 seconds or for as long as possible. Allow the piston or ball to fall to the bottom of the column. 7. Remove the mouthpiece from your mouth and breathe out normally. 8. Rest for a few seconds and repeat Steps 1 through 7 at least 10 times every 1-2 hours when you are awake. Take your time and take a few normal breaths between deep breaths. 9. The spirometer may include an indicator to show your best effort. Use the indicator as a goal to work toward during each repetition. 10. After each set of 10 deep breaths, practice coughing to be sure your lungs are clear. If you have an incision (the cut made at the time of surgery), support your incision when coughing by placing a pillow or rolled up towels firmly against it. Once you are able to get out of bed, walk around indoors and cough well. You may stop using the incentive spirometer when instructed by your caregiver.  RISKS AND COMPLICATIONS  Take your time so you do not get dizzy or light-headed.  If you are in pain, you may need to take or ask for pain medication before doing incentive spirometry. It is harder to take a deep breath if you are having pain. AFTER USE  Rest and breathe slowly and easily.  It can be helpful to keep track of a log of your progress. Your caregiver can provide you with a simple table to help with this. If you are using the spirometer at home, follow these instructions: [place] IF:   You are having difficultly using the spirometer.  You have trouble using the spirometer as often as instructed.  Your pain medication is not giving enough relief while using the spirometer.  You develop fever of 100.5 F (38.1 C)  or  higher. SEEK IMMEDIATE MEDICAL CARE IF:   You cough up bloody sputum that had not been present before.  You develop fever of 102 F (38.9 C) or greater.  You develop worsening pain at or near the incision site. MAKE SURE YOU:   Understand these instructions.  Will watch your condition.  Will get help right away if you are not doing well or get worse. Document Released: 03/27/2007 Document Revised: 02/06/2012 Document Reviewed: 05/28/2007 Exit[employer] Patient Information 2014 Exit[employer], [place].   ________________________________________________________________________  WHAT IS A BLOOD TRANSFUSION? Blood Transfusion Information  A transfusion is the replacement of blood or some of its parts. Blood is made up of multiple cells which provide different functions.  Red blood cells carry oxygen and are used for blood loss replacement.  White blood cells fight against infection.  Platelets control bleeding.  Plasma helps clot blood.  Other blood products are available for specialized needs, such as hemophilia or other clotting disorders. BEFORE THE TRANSFUSION  Who gives blood for transfusions?   Healthy volunteers who are fully evaluated to make sure their blood is safe. This is blood bank blood. Transfusion therapy is the safest it has ever been in the practice of medicine. Before blood is taken from a donor, a complete history is taken to make sure that person has no history of diseases nor engages in risky social behavior (examples are intravenous drug use or sexual activity with multiple partners). The donor's travel history is screened to minimize risk of transmitting infections, such as malaria. The donated blood is tested for signs of infectious diseases, such as HIV and hepatitis. The blood is then tested to be sure it is compatible with you in order to minimize the chance of a transfusion reaction. If you or a relative donates blood, this is often done in anticipation of surgery  and is not appropriate for emergency situations. It takes many days to process the donated blood. RISKS AND COMPLICATIONS Although transfusion therapy is very safe and saves many lives, the main dangers of transfusion include:   Getting an infectious disease.  Developing a transfusion reaction. This is an allergic reaction to something in the blood you were given. Every precaution is taken to prevent this. The decision to have a blood transfusion has been considered carefully by your caregiver before blood is given. Blood is not given unless the benefits outweigh the risks. AFTER THE TRANSFUSION  Right after receiving a blood transfusion, you will usually feel much better and more energetic. This is especially true if your red blood cells have gotten low (anemic). The transfusion raises the level of the red blood cells which carry oxygen, and this usually causes an energy increase.  The nurse administering the transfusion will monitor you carefully for complications. HOME CARE INSTRUCTIONS  No special instructions are needed after a transfusion. You may find your energy is better. Speak with your caregiver about any limitations on activity for underlying diseases you may have. SEEK MEDICAL CARE IF:   Your condition is not improving after your transfusion.  You develop redness or irritation at the intravenous (IV) site. SEEK IMMEDIATE MEDICAL CARE IF:  Any of the following symptoms occur over the next 12 hours:  Shaking chills.  You have a temperature by mouth above 102 F (38.9 C), not controlled by medicine.  Chest, back, or muscle pain.  People around you feel you are not acting correctly or are confused.  Shortness of breath or difficulty breathing.  Dizziness and  fainting.  You get a rash or develop hives.  You have a decrease in urine output.  Your urine turns a dark color or changes to pink, red, or brown. Any of the following symptoms occur over the next 10  days:  You have a temperature by mouth above 102 F (38.9 C), not controlled by medicine.  Shortness of breath.  Weakness after normal activity.  The white part of the eye turns yellow (jaundice).  You have a decrease in the amount of urine or are urinating less often.  Your urine turns a dark color or changes to pink, red, or brown. Document Released: 11/11/2000 Document Revised: 02/06/2012 Document Reviewed: 06/30/2008 [facility] Patient Information 2014 [place], [place].  _______________________________________________________________________

## 2018-01-25 ENCOUNTER — Encounter (HOSPITAL_COMMUNITY): Payer: Self-pay

## 2018-01-25 ENCOUNTER — Encounter (HOSPITAL_COMMUNITY)
Admission: RE | Admit: 2018-01-25 | Discharge: 2018-01-25 | Disposition: A | Discharge status: Home / Self Care | Payer: Medicare Other | Source: Ambulatory Visit | Attending: Orthopedic Surgery | Admitting: Orthopedic Surgery

## 2018-01-25 ENCOUNTER — Other Ambulatory Visit: Payer: Self-pay

## 2018-01-25 DIAGNOSIS — Z01812 Encounter for preprocedural laboratory examination: Secondary | ICD-10-CM | POA: Insufficient documentation

## 2018-01-25 DIAGNOSIS — M1612 Unilateral primary osteoarthritis, left hip: Secondary | ICD-10-CM | POA: Insufficient documentation

## 2018-01-25 LAB — APTT: APTT: 28 s (ref 24–36)

## 2018-01-25 LAB — CBC
HEMATOCRIT: 44.7 % (ref 39.0–52.0)
HEMOGLOBIN: 14.8 g/dL (ref 13.0–17.0)
MCH: 33.4 pg (ref 26.0–34.0)
MCHC: 33.1 g/dL (ref 30.0–36.0)
MCV: 100.9 fL — AB (ref 78.0–100.0)
Platelets: 271 10*3/uL (ref 150–400)
RBC: 4.43 MIL/uL (ref 4.22–5.81)
RDW: 13.3 % (ref 11.5–15.5)
WBC: 12.1 10*3/uL — ABNORMAL HIGH (ref 4.0–10.5)

## 2018-01-25 LAB — HEMOGLOBIN A1C
HEMOGLOBIN A1C: 6.8 % — AB (ref 4.8–5.6)
Mean Plasma Glucose: 148.46 mg/dL

## 2018-01-25 LAB — PROTIME-INR
INR: 0.94
PROTHROMBIN TIME: 12.4 s (ref 11.4–15.2)

## 2018-01-25 LAB — ABO/RH: ABO/RH(D): O POS

## 2018-01-25 LAB — COMPREHENSIVE METABOLIC PANEL
ALK PHOS: 58 U/L (ref 38–126)
ALT: 18 U/L (ref 17–63)
ANION GAP: 7 (ref 5–15)
AST: 17 U/L (ref 15–41)
Albumin: 3.6 g/dL (ref 3.5–5.0)
BILIRUBIN TOTAL: 0.8 mg/dL (ref 0.3–1.2)
BUN: 30 mg/dL — AB (ref 6–20)
CALCIUM: 9.3 mg/dL (ref 8.9–10.3)
CO2: 24 mmol/L (ref 22–32)
Chloride: 108 mmol/L (ref 101–111)
Creatinine, Ser: 1.26 mg/dL — ABNORMAL HIGH (ref 0.61–1.24)
GFR calc Af Amer: 59 mL/min — ABNORMAL LOW (ref >60)
GFR, EST NON AFRICAN AMERICAN: 51 mL/min — AB (ref >60)
GLUCOSE: 109 mg/dL — AB (ref 65–99)
Potassium: 4.5 mmol/L (ref 3.5–5.1)
Sodium: 139 mmol/L (ref 135–145)
TOTAL PROTEIN: 6.6 g/dL (ref 6.5–8.1)

## 2018-01-25 LAB — SURGICAL PCR SCREEN
MRSA, PCR: NEGATIVE
Staphylococcus aureus: NEGATIVE

## 2018-01-25 LAB — GLUCOSE, CAPILLARY: GLUCOSE-CAPILLARY: 117 mg/dL — AB (ref 65–99)

## 2018-01-30 MED ORDER — TRANEXAMIC ACID 1000 MG/10ML IV SOLN
1000.0000 mg | INTRAVENOUS | Status: AC
  Administered 2018-01-31: 1000 mg via INTRAVENOUS
  Filled 2018-01-30: qty 1100

## 2018-01-31 ENCOUNTER — Other Ambulatory Visit: Payer: Self-pay | Admitting: Orthopedic Surgery

## 2018-01-31 ENCOUNTER — Inpatient Hospital Stay (HOSPITAL_COMMUNITY): Payer: Medicare Other | Admitting: Certified Registered"

## 2018-01-31 ENCOUNTER — Other Ambulatory Visit: Payer: Self-pay

## 2018-01-31 ENCOUNTER — Inpatient Hospital Stay (HOSPITAL_COMMUNITY): Payer: Medicare Other

## 2018-01-31 ENCOUNTER — Encounter (HOSPITAL_COMMUNITY)
Admission: RE | Disposition: A | Discharge status: Home Health | Payer: Self-pay | Source: Ambulatory Visit | Attending: Orthopedic Surgery

## 2018-01-31 ENCOUNTER — Inpatient Hospital Stay (HOSPITAL_COMMUNITY)
Admission: RE | Admit: 2018-01-31 | Discharge: 2018-02-02 | DRG: 470 | Disposition: A | Discharge status: Home Health | Payer: Medicare Other | Source: Ambulatory Visit | Attending: Orthopedic Surgery | Admitting: Orthopedic Surgery

## 2018-01-31 ENCOUNTER — Encounter (HOSPITAL_COMMUNITY): Payer: Self-pay | Admitting: Certified Registered"

## 2018-01-31 DIAGNOSIS — Z8249 Family history of ischemic heart disease and other diseases of the circulatory system: Secondary | ICD-10-CM | POA: Diagnosis not present

## 2018-01-31 DIAGNOSIS — Z9842 Cataract extraction status, left eye: Secondary | ICD-10-CM

## 2018-01-31 DIAGNOSIS — H9319 Tinnitus, unspecified ear: Secondary | ICD-10-CM | POA: Diagnosis present

## 2018-01-31 DIAGNOSIS — M25752 Osteophyte, left hip: Secondary | ICD-10-CM | POA: Diagnosis present

## 2018-01-31 DIAGNOSIS — Z7982 Long term (current) use of aspirin: Secondary | ICD-10-CM | POA: Diagnosis not present

## 2018-01-31 DIAGNOSIS — Z7984 Long term (current) use of oral hypoglycemic drugs: Secondary | ICD-10-CM

## 2018-01-31 DIAGNOSIS — Z96649 Presence of unspecified artificial hip joint: Secondary | ICD-10-CM

## 2018-01-31 DIAGNOSIS — Z86010 Personal history of colonic polyps: Secondary | ICD-10-CM

## 2018-01-31 DIAGNOSIS — Z882 Allergy status to sulfonamides status: Secondary | ICD-10-CM

## 2018-01-31 DIAGNOSIS — N189 Chronic kidney disease, unspecified: Secondary | ICD-10-CM | POA: Diagnosis present

## 2018-01-31 DIAGNOSIS — Z791 Long term (current) use of non-steroidal anti-inflammatories (NSAID): Secondary | ICD-10-CM

## 2018-01-31 DIAGNOSIS — Z888 Allergy status to other drugs, medicaments and biological substances status: Secondary | ICD-10-CM | POA: Diagnosis not present

## 2018-01-31 DIAGNOSIS — E785 Hyperlipidemia, unspecified: Secondary | ICD-10-CM | POA: Diagnosis present

## 2018-01-31 DIAGNOSIS — M160 Bilateral primary osteoarthritis of hip: Secondary | ICD-10-CM | POA: Diagnosis present

## 2018-01-31 DIAGNOSIS — Z85828 Personal history of other malignant neoplasm of skin: Secondary | ICD-10-CM | POA: Diagnosis not present

## 2018-01-31 DIAGNOSIS — Z8261 Family history of arthritis: Secondary | ICD-10-CM | POA: Diagnosis not present

## 2018-01-31 DIAGNOSIS — Z79899 Other long term (current) drug therapy: Secondary | ICD-10-CM

## 2018-01-31 DIAGNOSIS — Z9841 Cataract extraction status, right eye: Secondary | ICD-10-CM | POA: Diagnosis not present

## 2018-01-31 DIAGNOSIS — Z881 Allergy status to other antibiotic agents status: Secondary | ICD-10-CM

## 2018-01-31 DIAGNOSIS — M169 Osteoarthritis of hip, unspecified: Secondary | ICD-10-CM

## 2018-01-31 DIAGNOSIS — E1122 Type 2 diabetes mellitus with diabetic chronic kidney disease: Secondary | ICD-10-CM | POA: Diagnosis present

## 2018-01-31 DIAGNOSIS — H9190 Unspecified hearing loss, unspecified ear: Secondary | ICD-10-CM | POA: Diagnosis present

## 2018-01-31 DIAGNOSIS — K5732 Diverticulitis of large intestine without perforation or abscess without bleeding: Secondary | ICD-10-CM | POA: Diagnosis present

## 2018-01-31 DIAGNOSIS — I129 Hypertensive chronic kidney disease with stage 1 through stage 4 chronic kidney disease, or unspecified chronic kidney disease: Secondary | ICD-10-CM | POA: Diagnosis present

## 2018-01-31 DIAGNOSIS — I454 Nonspecific intraventricular block: Secondary | ICD-10-CM | POA: Diagnosis present

## 2018-01-31 DIAGNOSIS — G8929 Other chronic pain: Secondary | ICD-10-CM | POA: Diagnosis present

## 2018-01-31 HISTORY — PX: TOTAL HIP ARTHROPLASTY: SHX124

## 2018-01-31 HISTORY — DX: Osteoarthritis of hip, unspecified: M16.9

## 2018-01-31 LAB — TYPE AND SCREEN
ABO/RH(D): O POS
Antibody Screen: NEGATIVE

## 2018-01-31 LAB — GLUCOSE, CAPILLARY
GLUCOSE-CAPILLARY: 121 mg/dL — AB (ref 65–99)
GLUCOSE-CAPILLARY: 282 mg/dL — AB (ref 65–99)
Glucose-Capillary: 106 mg/dL — ABNORMAL HIGH (ref 65–99)
Glucose-Capillary: 129 mg/dL — ABNORMAL HIGH (ref 65–99)

## 2018-01-31 SURGERY — ARTHROPLASTY, HIP, TOTAL, ANTERIOR APPROACH
Anesthesia: Spinal | Site: Hip | Laterality: Left

## 2018-01-31 MED ORDER — BUPIVACAINE-EPINEPHRINE (PF) 0.5% -1:200000 IJ SOLN
INTRAMUSCULAR | Status: AC
  Filled 2018-01-31: qty 30

## 2018-01-31 MED ORDER — HYDROMORPHONE HCL 1 MG/ML IJ SOLN: 0.5000 mg | INTRAMUSCULAR | Status: DC | PRN

## 2018-01-31 MED ORDER — BUPIVACAINE HCL (PF) 0.25 % IJ SOLN
INTRAMUSCULAR | Status: DC | PRN
  Administered 2018-01-31: 30 mL

## 2018-01-31 MED ORDER — HYDROMORPHONE HCL 1 MG/ML IJ SOLN
INTRAMUSCULAR | Status: AC
  Filled 2018-01-31: qty 1

## 2018-01-31 MED ORDER — ONDANSETRON HCL 4 MG/2ML IJ SOLN
INTRAMUSCULAR | Status: DC | PRN
  Administered 2018-01-31: 4 mg via INTRAVENOUS

## 2018-01-31 MED ORDER — OXYCODONE HCL 5 MG PO TABS
10.0000 mg | ORAL_TABLET | ORAL | Status: DC | PRN
  Administered 2018-01-31 – 2018-02-01 (×2): 10 mg via ORAL
  Administered 2018-02-01: 15 mg via ORAL
  Administered 2018-02-01 – 2018-02-02 (×2): 10 mg via ORAL
  Filled 2018-01-31: qty 3
  Filled 2018-01-31 (×2): qty 2

## 2018-01-31 MED ORDER — INSULIN ASPART 100 UNIT/ML ~~LOC~~ SOLN
0.0000 [IU] | Freq: Three times a day (TID) | SUBCUTANEOUS | Status: DC
  Administered 2018-01-31: 18:00:00 2 [IU] via SUBCUTANEOUS
  Administered 2018-02-01: 3 [IU] via SUBCUTANEOUS
  Administered 2018-02-01 (×2): 5 [IU] via SUBCUTANEOUS
  Administered 2018-02-02 (×2): 2 [IU] via SUBCUTANEOUS

## 2018-01-31 MED ORDER — DOCUSATE SODIUM 100 MG PO CAPS
100.0000 mg | ORAL_CAPSULE | Freq: Two times a day (BID) | ORAL | Status: DC
  Administered 2018-01-31 – 2018-02-02 (×4): 100 mg via ORAL
  Filled 2018-01-31 (×4): qty 1

## 2018-01-31 MED ORDER — EZETIMIBE 10 MG PO TABS
10.0000 mg | ORAL_TABLET | Freq: Every day | ORAL | Status: DC
  Administered 2018-02-01 – 2018-02-02 (×2): 10 mg via ORAL
  Filled 2018-01-31 (×2): qty 1

## 2018-01-31 MED ORDER — CEFAZOLIN SODIUM-DEXTROSE 2-4 GM/100ML-% IV SOLN
2.0000 g | Freq: Four times a day (QID) | INTRAVENOUS | Status: AC
  Administered 2018-01-31 – 2018-02-01 (×2): 2 g via INTRAVENOUS
  Filled 2018-01-31 (×2): qty 100

## 2018-01-31 MED ORDER — HYDROMORPHONE HCL 1 MG/ML IJ SOLN
0.2500 mg | INTRAMUSCULAR | Status: DC | PRN
  Administered 2018-01-31 (×3): 0.5 mg via INTRAVENOUS

## 2018-01-31 MED ORDER — PHENYLEPHRINE 40 MCG/ML (10ML) SYRINGE FOR IV PUSH (FOR BLOOD PRESSURE SUPPORT)
PREFILLED_SYRINGE | INTRAVENOUS | Status: AC
  Filled 2018-01-31: qty 10

## 2018-01-31 MED ORDER — MECLIZINE HCL 25 MG PO TABS
12.5000 mg | ORAL_TABLET | Freq: Three times a day (TID) | ORAL | Status: DC | PRN
  Administered 2018-02-02: 13:00:00 12.5 mg via ORAL
  Filled 2018-01-31: qty 1

## 2018-01-31 MED ORDER — DEXAMETHASONE SODIUM PHOSPHATE 10 MG/ML IJ SOLN
INTRAMUSCULAR | Status: AC
  Filled 2018-01-31: qty 1

## 2018-01-31 MED ORDER — OXYCODONE HCL 5 MG PO TABS
5.0000 mg | ORAL_TABLET | ORAL | Status: DC | PRN
  Administered 2018-01-31 – 2018-02-02 (×5): 10 mg via ORAL
  Filled 2018-01-31 (×7): qty 2

## 2018-01-31 MED ORDER — BUPIVACAINE HCL (PF) 0.25 % IJ SOLN
INTRAMUSCULAR | Status: AC
  Filled 2018-01-31: qty 30

## 2018-01-31 MED ORDER — STERILE WATER FOR IRRIGATION IR SOLN
Status: DC | PRN
  Administered 2018-01-31: 2000 mL

## 2018-01-31 MED ORDER — TRAMADOL HCL 50 MG PO TABS: 50.0000 mg | ORAL_TABLET | Freq: Four times a day (QID) | ORAL | Status: DC | PRN

## 2018-01-31 MED ORDER — METHOCARBAMOL 1000 MG/10ML IJ SOLN
500.0000 mg | Freq: Four times a day (QID) | INTRAVENOUS | Status: DC | PRN
  Administered 2018-01-31: 500 mg via INTRAVENOUS
  Filled 2018-01-31: qty 550

## 2018-01-31 MED ORDER — FENTANYL CITRATE (PF) 100 MCG/2ML IJ SOLN
INTRAMUSCULAR | Status: DC | PRN
  Administered 2018-01-31: 100 ug via INTRAVENOUS

## 2018-01-31 MED ORDER — DEXTROSE 5 % IV SOLN
INTRAVENOUS | Status: DC | PRN
  Administered 2018-01-31: 25 ug/min via INTRAVENOUS

## 2018-01-31 MED ORDER — METOCLOPRAMIDE HCL 5 MG PO TABS
5.0000 mg | ORAL_TABLET | Freq: Three times a day (TID) | ORAL | Status: DC | PRN
  Administered 2018-02-01 – 2018-02-02 (×2): 5 mg via ORAL
  Filled 2018-01-31: qty 2
  Filled 2018-01-31: qty 1

## 2018-01-31 MED ORDER — MIDAZOLAM HCL 2 MG/2ML IJ SOLN
INTRAMUSCULAR | Status: AC
  Filled 2018-01-31: qty 2

## 2018-01-31 MED ORDER — HYDROCHLOROTHIAZIDE 25 MG PO TABS
12.5000 mg | ORAL_TABLET | Freq: Every day | ORAL | Status: DC
  Administered 2018-02-01 – 2018-02-02 (×2): 12.5 mg via ORAL
  Filled 2018-01-31 (×2): qty 1

## 2018-01-31 MED ORDER — ONDANSETRON HCL 4 MG/2ML IJ SOLN: 4.0000 mg | Freq: Four times a day (QID) | INTRAMUSCULAR | Status: DC | PRN

## 2018-01-31 MED ORDER — ACETAMINOPHEN 500 MG PO TABS
1000.0000 mg | ORAL_TABLET | Freq: Four times a day (QID) | ORAL | Status: AC
  Administered 2018-01-31 – 2018-02-01 (×3): 1000 mg via ORAL
  Filled 2018-01-31 (×3): qty 2

## 2018-01-31 MED ORDER — HYDROMORPHONE HCL 1 MG/ML IJ SOLN
INTRAMUSCULAR | Status: AC
  Administered 2018-01-31: 0.5 mg via INTRAVENOUS
  Filled 2018-01-31: qty 1

## 2018-01-31 MED ORDER — FLEET ENEMA 7-19 GM/118ML RE ENEM: 1.0000 | ENEMA | Freq: Once | RECTAL | Status: DC | PRN

## 2018-01-31 MED ORDER — CEFAZOLIN SODIUM-DEXTROSE 2-4 GM/100ML-% IV SOLN
2.0000 g | INTRAVENOUS | Status: AC
  Administered 2018-01-31: 2 g via INTRAVENOUS

## 2018-01-31 MED ORDER — POLYETHYLENE GLYCOL 3350 17 G PO PACK: 17.0000 g | PACK | Freq: Every day | ORAL | Status: DC | PRN

## 2018-01-31 MED ORDER — LACTATED RINGERS IV SOLN
INTRAVENOUS | Status: DC
  Administered 2018-01-31: 1000 mL via INTRAVENOUS
  Administered 2018-01-31: 14:00:00 via INTRAVENOUS

## 2018-01-31 MED ORDER — PROPOFOL 10 MG/ML IV BOLUS
INTRAVENOUS | Status: AC
  Filled 2018-01-31: qty 40

## 2018-01-31 MED ORDER — ACETAMINOPHEN 10 MG/ML IV SOLN
1000.0000 mg | Freq: Once | INTRAVENOUS | Status: AC
  Administered 2018-01-31: 1000 mg via INTRAVENOUS

## 2018-01-31 MED ORDER — MENTHOL 3 MG MT LOZG
1.0000 | LOZENGE | OROMUCOSAL | Status: DC | PRN
  Filled 2018-01-31: qty 9

## 2018-01-31 MED ORDER — SODIUM CHLORIDE 0.9 % IV SOLN
INTRAVENOUS | Status: DC
  Administered 2018-01-31: 1000 mL via INTRAVENOUS

## 2018-01-31 MED ORDER — CHLORHEXIDINE GLUCONATE 4 % EX LIQD: 60.0000 mL | Freq: Once | CUTANEOUS | Status: DC

## 2018-01-31 MED ORDER — PROMETHAZINE HCL 25 MG/ML IJ SOLN: 6.2500 mg | INTRAMUSCULAR | Status: DC | PRN

## 2018-01-31 MED ORDER — FENTANYL CITRATE (PF) 100 MCG/2ML IJ SOLN
INTRAMUSCULAR | Status: AC
  Filled 2018-01-31: qty 2

## 2018-01-31 MED ORDER — 0.9 % SODIUM CHLORIDE (POUR BTL) OPTIME
TOPICAL | Status: DC | PRN
  Administered 2018-01-31: 1000 mL

## 2018-01-31 MED ORDER — MIDAZOLAM HCL 5 MG/5ML IJ SOLN
INTRAMUSCULAR | Status: DC | PRN
  Administered 2018-01-31: 1 mg via INTRAVENOUS

## 2018-01-31 MED ORDER — METHOCARBAMOL 500 MG PO TABS
500.0000 mg | ORAL_TABLET | Freq: Four times a day (QID) | ORAL | Status: DC | PRN
  Administered 2018-02-01 – 2018-02-02 (×3): 500 mg via ORAL
  Filled 2018-01-31 (×3): qty 1

## 2018-01-31 MED ORDER — BUPIVACAINE IN DEXTROSE 0.75-8.25 % IT SOLN
INTRATHECAL | Status: DC | PRN
  Administered 2018-01-31: 2 mL via INTRATHECAL

## 2018-01-31 MED ORDER — DEXAMETHASONE SODIUM PHOSPHATE 10 MG/ML IJ SOLN
10.0000 mg | Freq: Once | INTRAMUSCULAR | Status: AC
  Administered 2018-01-31: 10 mg via INTRAVENOUS

## 2018-01-31 MED ORDER — PROPOFOL 500 MG/50ML IV EMUL
INTRAVENOUS | Status: DC | PRN
  Administered 2018-01-31: 75 ug/kg/min via INTRAVENOUS

## 2018-01-31 MED ORDER — PHENYLEPHRINE 40 MCG/ML (10ML) SYRINGE FOR IV PUSH (FOR BLOOD PRESSURE SUPPORT)
PREFILLED_SYRINGE | INTRAVENOUS | Status: DC | PRN
  Administered 2018-01-31 (×3): 80 ug via INTRAVENOUS

## 2018-01-31 MED ORDER — ONDANSETRON HCL 4 MG/2ML IJ SOLN
INTRAMUSCULAR | Status: AC
  Filled 2018-01-31: qty 2

## 2018-01-31 MED ORDER — DEXAMETHASONE SODIUM PHOSPHATE 10 MG/ML IJ SOLN
10.0000 mg | Freq: Once | INTRAMUSCULAR | Status: AC
  Administered 2018-02-01: 10:00:00 10 mg via INTRAVENOUS
  Filled 2018-01-31: qty 1

## 2018-01-31 MED ORDER — ACETAMINOPHEN 10 MG/ML IV SOLN
INTRAVENOUS | Status: AC
  Filled 2018-01-31: qty 100

## 2018-01-31 MED ORDER — DIPHENHYDRAMINE HCL 12.5 MG/5ML PO ELIX: 12.5000 mg | ORAL_SOLUTION | ORAL | Status: DC | PRN

## 2018-01-31 MED ORDER — PHENYLEPHRINE HCL 10 MG/ML IJ SOLN
INTRAMUSCULAR | Status: AC
  Filled 2018-01-31: qty 1

## 2018-01-31 MED ORDER — EPHEDRINE SULFATE-NACL 50-0.9 MG/10ML-% IV SOSY
PREFILLED_SYRINGE | INTRAVENOUS | Status: DC | PRN
  Administered 2018-01-31: 10 mg via INTRAVENOUS

## 2018-01-31 MED ORDER — BISACODYL 10 MG RE SUPP: 10.0000 mg | Freq: Every day | RECTAL | Status: DC | PRN

## 2018-01-31 MED ORDER — EPHEDRINE SULFATE-NACL 50-0.9 MG/10ML-% IV SOSY: PREFILLED_SYRINGE | INTRAVENOUS | Status: DC | PRN

## 2018-01-31 MED ORDER — EPHEDRINE 5 MG/ML INJ
INTRAVENOUS | Status: AC
  Filled 2018-01-31: qty 10

## 2018-01-31 MED ORDER — ONDANSETRON HCL 4 MG PO TABS: 4.0000 mg | ORAL_TABLET | Freq: Four times a day (QID) | ORAL | Status: DC | PRN

## 2018-01-31 MED ORDER — METOCLOPRAMIDE HCL 5 MG/ML IJ SOLN: 5.0000 mg | Freq: Three times a day (TID) | INTRAMUSCULAR | Status: DC | PRN

## 2018-01-31 MED ORDER — RIVAROXABAN 10 MG PO TABS
10.0000 mg | ORAL_TABLET | Freq: Every day | ORAL | Status: DC
  Administered 2018-02-01 – 2018-02-02 (×2): 10 mg via ORAL
  Filled 2018-01-31 (×2): qty 1

## 2018-01-31 MED ORDER — PHENOL 1.4 % MT LIQD
1.0000 | OROMUCOSAL | Status: DC | PRN
  Filled 2018-01-31: qty 177

## 2018-01-31 MED ORDER — CEFAZOLIN SODIUM-DEXTROSE 2-4 GM/100ML-% IV SOLN
INTRAVENOUS | Status: AC
  Filled 2018-01-31: qty 100

## 2018-01-31 SURGICAL SUPPLY — 37 items
BLADE SAG 18X100X1.27 (BLADE) ×3 IMPLANT
CAPT HIP TOTAL 2 ×3 IMPLANT
CLOSURE WOUND 1/2 X4 (GAUZE/BANDAGES/DRESSINGS) ×2
CLOTH BEACON ORANGE TIMEOUT ST (SAFETY) ×3 IMPLANT
COVER PERINEAL POST (MISCELLANEOUS) ×3 IMPLANT
COVER SURGICAL LIGHT HANDLE (MISCELLANEOUS) ×3 IMPLANT
DECANTER SPIKE VIAL GLASS SM (MISCELLANEOUS) ×3 IMPLANT
DRAPE STERI IOBAN 125X83 (DRAPES) ×3 IMPLANT
DRAPE U-SHAPE 47X51 STRL (DRAPES) ×6 IMPLANT
DRSG ADAPTIC 3X8 NADH LF (GAUZE/BANDAGES/DRESSINGS) ×3 IMPLANT
DRSG MEPILEX BORDER 4X4 (GAUZE/BANDAGES/DRESSINGS) ×3 IMPLANT
DRSG MEPILEX BORDER 4X8 (GAUZE/BANDAGES/DRESSINGS) ×3 IMPLANT
DURAPREP 26ML APPLICATOR (WOUND CARE) ×3 IMPLANT
ELECT REM PT RETURN 15FT ADLT (MISCELLANEOUS) ×3 IMPLANT
EVACUATOR 1/8 PVC DRAIN (DRAIN) ×3 IMPLANT
GLOVE BIO SURGEON STRL SZ8 (GLOVE) ×6 IMPLANT
GLOVE BIOGEL PI IND STRL 6.5 (GLOVE) ×1 IMPLANT
GLOVE BIOGEL PI IND STRL 7.5 (GLOVE) ×4 IMPLANT
GLOVE BIOGEL PI IND STRL 8 (GLOVE) ×2 IMPLANT
GLOVE BIOGEL PI INDICATOR 6.5 (GLOVE) ×2
GLOVE BIOGEL PI INDICATOR 7.5 (GLOVE) ×8
GLOVE BIOGEL PI INDICATOR 8 (GLOVE) ×4
GLOVE SURG SS PI 6.5 STRL IVOR (GLOVE) ×3 IMPLANT
GLOVE SURG SS PI 8.0 STRL IVOR (GLOVE) ×6 IMPLANT
GOWN SPEC L3 XXLG W/TWL (GOWN DISPOSABLE) ×3 IMPLANT
GOWN STRL REUS W/TWL LRG LVL3 (GOWN DISPOSABLE) ×6 IMPLANT
GOWN STRL REUS W/TWL XL LVL3 (GOWN DISPOSABLE) ×3 IMPLANT
PACK ANTERIOR HIP CUSTOM (KITS) ×3 IMPLANT
STRIP CLOSURE SKIN 1/2X4 (GAUZE/BANDAGES/DRESSINGS) ×3 IMPLANT
SUT ETHIBOND NAB CT1 #1 30IN (SUTURE) ×3 IMPLANT
SUT MNCRL AB 4-0 PS2 18 (SUTURE) ×3 IMPLANT
SUT STRATAFIX 0 PDS 27 VIOLET (SUTURE) ×3
SUT VIC AB 2-0 CT1 27 (SUTURE) ×6
SUT VIC AB 2-0 CT1 TAPERPNT 27 (SUTURE) ×2 IMPLANT
SUTURE STRATFX 0 PDS 27 VIOLET (SUTURE) ×1 IMPLANT
TRAY FOLEY W/METER SILVER 16FR (SET/KITS/TRAYS/PACK) ×3 IMPLANT
YANKAUER SUCT BULB TIP 10FT TU (MISCELLANEOUS) ×3 IMPLANT

## 2018-01-31 NOTE — Care Plan (Signed)
L THA 01-31-18 DCP:  Home with spouse.  2 story home with 7 ste. DME:  No needs, Rx for RW and 3-in-1 given to patient prior to surgery.  PT:  OP PT eval scheduled at [facility] on 02-05-18.

## 2018-01-31 NOTE — Op Note (Signed)
OPERATIVE REPORT- TOTAL HIP ARTHROPLASTY   PREOPERATIVE DIAGNOSIS: Osteoarthritis of the Left hip.   POSTOPERATIVE DIAGNOSIS: Osteoarthritis of the Left  hip.   PROCEDURE: Left total hip arthroplasty, anterior approach.   SURGEON: Gaynelle Arabian, MD   ASSISTANT: Ardeen Jourdain, PA-C  ANESTHESIA:  Spinal  ESTIMATED BLOOD LOSS:-250 mL    DRAINS: Hemovac x1.   COMPLICATIONS: None   CONDITION: PACU - hemodynamically stable.   BRIEF CLINICAL NOTE: Phillip Neal is a 84 y.o. male who has advanced end-  stage arthritis of their Left  hip with progressively worsening pain and  dysfunction.The patient has failed nonoperative management and presents for  total hip arthroplasty.   PROCEDURE IN DETAIL: After successful administration of spinal  anesthetic, the traction boots for the [facility] bed were placed on both  feet and the patient was placed onto the [facility] bed, boots placed into the leg  holders. The Left hip was then isolated from the perineum with plastic  drapes and prepped and draped in the usual sterile fashion. ASIS and  greater trochanter were marked and a oblique incision was made, starting  at about 1 cm lateral and 2 cm distal to the ASIS and coursing towards  the anterior cortex of the femur. The skin was cut with a 10 blade  through subcutaneous tissue to the level of the fascia overlying the  tensor fascia lata muscle. The fascia was then incised in line with the  incision at the junction of the anterior third and posterior 2/3rd. The  muscle was teased off the fascia and then the interval between the TFL  and the rectus was developed. The Hohmann retractor was then placed at  the top of the femoral neck over the capsule. The vessels overlying the  capsule were cauterized and the fat on top of the capsule was removed.  A Hohmann retractor was then placed anterior underneath the rectus  femoris to give exposure to the entire anterior capsule. A T-shaped   capsulotomy was performed. The edges were tagged and the femoral head  was identified.       Osteophytes are removed off the superior acetabulum.  The femoral neck was then cut in situ with an oscillating saw. Traction  was then applied to the left lower extremity utilizing the [facility]  traction. The femoral head was then removed. Retractors were placed  around the acetabulum and then circumferential removal of the labrum was  performed. Osteophytes were also removed. Reaming starts at 49 mm to  medialize and  Increased in 2 mm increments to 55 mm. We reamed in  approximately 40 degrees of abduction, 20 degrees anteversion. A 56 mm  pinnacle acetabular shell was then impacted in anatomic position under  fluoroscopic guidance with excellent purchase. We did not need to place  any additional dome screws. A 36 mm neutral + 4 marathon liner was then  placed into the acetabular shell.       The femoral lift was then placed along the lateral aspect of the femur  just distal to the vastus ridge. The leg was  externally rotated and capsule  was stripped off the inferior aspect of the femoral neck down to the  level of the lesser trochanter, this was done with electrocautery. The femur was lifted after this was performed. The  leg was then placed in an extended and adducted position essentially delivering the femur. We also removed the capsule superiorly and the piriformis from the piriformis fossa  to gain excellent exposure of the  proximal femur. Rongeur was used to remove some cancellous bone to get  into the lateral portion of the proximal femur for placement of the  initial starter reamer. The starter broaches was placed  the starter broach  and was shown to go down the center of the canal. Broaching  with the  Corail system was then performed starting at size 8, coursing  Up to size 12. A size 12 had excellent torsional and rotational  and axial stability. The trial high offset neck was then  placed  with a 36 + 5 trial head. The hip was then reduced. We confirmed that  the stem was in the canal both on AP and lateral x-rays. It also has excellent sizing. The hip was reduced with outstanding stability through full extension and full external rotation.. AP pelvis was taken and the leg lengths were measured and found to be equal. Hip was then dislocated again and the femoral head and neck removed. The  femoral broach was removed. Size 12 Corail stem with a high offset  neck was then impacted into the femur following native anteversion. Has  excellent purchase in the canal. Excellent torsional and rotational and  axial stability. It is confirmed to be in the canal on AP and lateral  fluoroscopic views. The 36 + 5 ceramic head was placed and the hip  reduced with outstanding stability. Again AP pelvis was taken and it  confirmed that the leg lengths were equal. The wound was then copiously  irrigated with saline solution and the capsule reattached and repaired  with Ethibond suture. 30 ml of .25% Bupivicaine was  injected into the capsule and into the edge of the tensor fascia lata as well as subcutaneous tissue. The fascia overlying the tensor fascia lata was then closed with a running #1 V-Loc. Subcu was closed with interrupted 2-0 Vicryl and subcuticular running 4-0 Monocryl. Incision was cleaned  and dried. Steri-Strips and a bulky sterile dressing applied. Hemovac  drain was hooked to suction and then the patient was awakened and transported to  recovery in stable condition.        Please note that a surgical assistant was a medical necessity for this procedure to perform it in a safe and expeditious manner. Assistant was necessary to provide appropriate retraction of vital neurovascular structures and to prevent femoral fracture and allow for anatomic placement of the prosthesis.  Gaynelle Arabian, M.D.

## 2018-01-31 NOTE — Anesthesia Preprocedure Evaluation (Signed)
Anesthesia Evaluation  Patient identified by MRN, date of birth, ID band Patient awake    Reviewed: Allergy & Precautions, NPO status , Patient's Chart, lab work & pertinent test results  Airway Mallampati: II  TM Distance: >3 FB Neck ROM: Full    Dental no notable dental hx.    Pulmonary neg pulmonary ROS,    Pulmonary exam normal breath sounds clear to auscultation       Cardiovascular hypertension, Normal cardiovascular exam Rhythm:Regular Rate:Normal     Neuro/Psych negative neurological ROS  negative psych ROS   GI/Hepatic negative GI ROS, Neg liver ROS,   Endo/Other  diabetes  Renal/GU Renal InsufficiencyRenal disease  negative genitourinary   Musculoskeletal negative musculoskeletal ROS (+)   Abdominal   Peds negative pediatric ROS (+)  Hematology negative hematology ROS (+)   Anesthesia Other Findings   Reproductive/Obstetrics negative OB ROS                             Anesthesia Physical Anesthesia Plan  ASA: II  Anesthesia Plan: Spinal   Post-op Pain Management:    Induction: Intravenous  PONV Risk Score and Plan: 1 and Ondansetron  Airway Management Planned: Simple Face Mask  Additional Equipment:   Intra-op Plan:   Post-operative Plan:   Informed Consent: I have reviewed the patients History and Physical, chart, labs and discussed the procedure including the risks, benefits and alternatives for the proposed anesthesia with the patient or authorized representative who has indicated his/her understanding and acceptance.   Dental advisory given  Plan Discussed with: CRNA and Surgeon  Anesthesia Plan Comments:         Anesthesia Quick Evaluation

## 2018-01-31 NOTE — Anesthesia Procedure Notes (Signed)
Spinal  Patient location during procedure: OR End time: 01/31/2018 1:16 PM Staffing Resident/CRNA: Joaquina Nissen D, CRNA Performed: anesthesiologist and resident/CRNA  Preanesthetic Checklist Completed: patient identified, site marked, surgical consent, pre-op evaluation, timeout performed, IV checked, risks and benefits discussed and monitors and equipment checked Spinal Block Patient position: sitting Prep: Betadine Patient monitoring: heart rate, continuous pulse ox and blood pressure Approach: midline Location: L2-3 Injection technique: single-shot Needle Needle type: Sprotte  Needle gauge: 24 G Needle length: 9 cm Assessment Sensory level: T6 Additional Notes Expiration date of kit checked and confirmed. Patient tolerated procedure well, without complications.

## 2018-01-31 NOTE — Anesthesia Postprocedure Evaluation (Signed)
Anesthesia Post Note  Patient: Phillip Neal  Procedure(s) Performed: LEFT TOTAL HIP ARTHROPLASTY ANTERIOR APPROACH (Left Hip)     Patient location during evaluation: PACU Anesthesia Type: Spinal Level of consciousness: oriented and awake and alert Pain management: pain level controlled Vital Signs Assessment: post-procedure vital signs reviewed and stable Respiratory status: spontaneous breathing, respiratory function stable and patient connected to nasal cannula oxygen Cardiovascular status: blood pressure returned to baseline and stable Postop Assessment: no headache, no backache and no apparent nausea or vomiting Anesthetic complications: no    Last Vitals:  Vitals:   01/31/18 1515 01/31/18 1539  BP: 130/71 127/74  Pulse: 63 75  Resp: 15 15  Temp: 36.9 C 36.6 C  SpO2: 100% 98%    Last Pain:  Vitals:   01/31/18 1515  PainSc: 3                  Mattison Golay S

## 2018-01-31 NOTE — Plan of Care (Signed)
  Progressing Education: Knowledge of General Education information will improve 01/31/2018 1715 - Progressing by Milderd Meager, RN Health Behavior/Discharge Planning: Ability to manage health-related needs will improve 01/31/2018 1715 - Progressing by Milderd Meager, RN Clinical Measurements: Ability to maintain clinical measurements within normal limits will improve 01/31/2018 1715 - Progressing by Milderd Meager, RN Will remain free from infection 01/31/2018 1715 - Progressing by Milderd Meager, RN Diagnostic test results will improve 01/31/2018 1715 - Progressing by Milderd Meager, RN Respiratory complications will improve 01/31/2018 1715 - Progressing by Milderd Meager, RN Cardiovascular complication will be avoided 01/31/2018 1715 - Progressing by Milderd Meager, RN Activity: Risk for activity intolerance will decrease 01/31/2018 1715 - Progressing by Milderd Meager, RN Nutrition: Adequate nutrition will be maintained 01/31/2018 1715 - Progressing by Milderd Meager, RN Coping: Level of anxiety will decrease 01/31/2018 1715 - Progressing by Milderd Meager, RN Elimination: Will not experience complications related to bowel motility 01/31/2018 1715 - Progressing by Milderd Meager, RN Will not experience complications related to urinary retention 01/31/2018 1715 - Progressing by Milderd Meager, RN Pain Managment: General experience of comfort will improve 01/31/2018 1715 - Progressing by Milderd Meager, RN Safety: Ability to remain free from injury will improve 01/31/2018 1715 - Progressing by Milderd Meager, RN Skin Integrity: Risk for impaired skin integrity will decrease 01/31/2018 1715 - Progressing by Milderd Meager, RN Education: Knowledge of the prescribed therapeutic regimen will improve 01/31/2018 1715 - Progressing by Milderd Meager, RN Understanding of discharge needs will improve 01/31/2018 1715 - Progressing by Milderd Meager, RN Activity: Ability to avoid complications of mobility impairment will improve 01/31/2018 1715 - Progressing by Milderd Meager, RN Ability to tolerate increased activity will improve 01/31/2018 1715 - Progressing by Milderd Meager, RN Clinical Measurements: Postoperative complications will be avoided or minimized 01/31/2018 1715 - Progressing by Milderd Meager, RN Pain Management: Pain level will decrease with appropriate interventions 01/31/2018 1715 - Progressing by Milderd Meager, RN Skin Integrity: Signs of wound healing will improve 01/31/2018 1715 - Progressing by Milderd Meager, RN

## 2018-01-31 NOTE — Interval H&P Note (Signed)
History and Physical Interval Note:  01/31/2018 1:00 PM  Phillip Neal  has presented today for surgery, with the diagnosis of Osteoarthritis Left hip  The various methods of treatment have been discussed with the patient and family. After consideration of risks, benefits and other options for treatment, the patient has consented to  Procedure(s): LEFT TOTAL HIP ARTHROPLASTY ANTERIOR APPROACH (Left) as a surgical intervention .  [name] history has been reviewed, patient examined, no change in status, stable for surgery.  I have reviewed the patient's chart and labs.  Questions were answered to the patient's satisfaction.     Pilar Plate Deetra Booton

## 2018-01-31 NOTE — Evaluation (Signed)
Physical Therapy Evaluation Patient Details Name: Phillip Neal MRN: [id number] DOB: 24-May-1931 Today's Date: 01/31/2018   History of Present Illness  Patient is an 84 y/o male s/p L anterior approach THA.  Clinical Impression  Patient presents with decreased independence with mobility due to deficits listed in PT problem list. He will benefit from skilled PT in the acute setting to allow return home with family assist and follow up HHPT.  Follow Up Recommendations Home health PT    Equipment Recommendations  None recommended by PT    Recommendations for Other Services       Precautions / Restrictions Precautions Precautions: Fall Restrictions Weight Bearing Restrictions: No      Mobility  Bed Mobility Overal bed mobility: Needs Assistance Bed Mobility: Supine to Sit     Supine to sit: HOB elevated;Min guard     General bed mobility comments: for safety  Transfers Overall transfer level: Needs assistance Equipment used: Rolling walker (2 wheeled) Transfers: Sit to/from Stand Sit to Stand: Min assist         General transfer comment: cues for hand placement  Ambulation/Gait Ambulation/Gait assistance: Min guard Ambulation Distance (Feet): 75 Feet Assistive device: Rolling walker (2 wheeled) Gait Pattern/deviations: Step-through pattern;Decreased stride length;Trunk flexed     General Gait Details: cues for forward gaze  Stairs            Wheelchair Mobility    Modified Rankin (Stroke Patients Only)       Balance Overall balance assessment: Needs assistance Sitting-balance support: Feet supported Sitting balance-Leahy Scale: Good     Standing balance support: Bilateral upper extremity supported Standing balance-Leahy Scale: Fair Standing balance comment: UE support for gait                             Pertinent Vitals/Pain Pain Assessment: 0-10 Pain Score: 4  Pain Location: L hip Pain Descriptors / Indicators:  Sore Pain Intervention(s): Monitored during session;Repositioned;RN gave pain meds during session    Home Living Family/patient expects to be discharged to:: Private residence Living Arrangements: Spouse/significant other Available Help at Discharge: Family Type of Home: House Home Access: Stairs to enter Entrance Stairs-Rails: Psychiatric nurse of Steps: 6 Home Layout: One level Home Equipment: Environmental consultant - 2 wheels      Prior Function Level of Independence: Independent         Comments: gardens and does yard work     Journalist, newspaper        Extremity/Trunk Assessment   Upper Extremity Assessment Upper Extremity Assessment: Overall WFL for tasks assessed    Lower Extremity Assessment Lower Extremity Assessment: LLE deficits/detail LLE Deficits / Details: limited due to pain, strength at least 3/5       Communication   Communication: HOH  Cognition Arousal/Alertness: Awake/alert Behavior During Therapy: WFL for tasks assessed/performed Overall Cognitive Status: Within Functional Limits for tasks assessed                                        General Comments General comments (skin integrity, edema, etc.): wife and daughters in room    Exercises Total Joint Exercises Ankle Circles/Pumps: AROM;10 reps;Supine Heel Slides: AROM;Left;10 reps;Supine   Assessment/Plan    PT Assessment Patient needs continued PT services  PT Problem List Decreased strength;Decreased balance;Decreased mobility;Decreased range of motion;Decreased activity tolerance;Pain;Decreased knowledge of use of  DME       PT Treatment Interventions Stair training;Therapeutic exercise;Gait training;Therapeutic activities;DME instruction;Functional mobility training;Balance training;Patient/family education    PT Goals (Current goals can be found in the Care Plan section)  Acute Rehab PT Goals Patient Stated Goal: To go home PT Goal Formulation: With  patient/family Time For Goal Achievement: 02/07/18 Potential to Achieve Goals: Good    Frequency 7X/week   Barriers to discharge        Co-evaluation               AM-PAC PT "6 Clicks" Daily Activity  Outcome Measure Difficulty turning over in bed (including adjusting bedclothes, sheets and blankets)?: A Lot Difficulty moving from lying on back to sitting on the side of the bed? : Unable Difficulty sitting down on and standing up from a chair with arms (e.g., wheelchair, bedside commode, etc,.)?: Unable Help needed moving to and from a bed to chair (including a wheelchair)?: A Little Help needed walking in hospital room?: A Little Help needed climbing 3-5 steps with a railing? : A Little 6 Click Score: 13    End of Session Equipment Utilized During Treatment: Gait belt Activity Tolerance: Patient tolerated treatment well Patient left: in chair;with chair alarm set;with family/visitor present;with call bell/phone within reach   PT Visit Diagnosis: Pain;Difficulty in walking, not elsewhere classified (R26.2) Pain - Right/Left: Left Pain - part of body: Hip    Time: 1650-1715 PT Time Calculation (min) (ACUTE ONLY): 25 min   Charges:   PT Evaluation $PT Eval Moderate Complexity: 1 Mod PT Treatments $Gait Training: 8-22 mins   PT G CodesMagda Kiel, [place] [phone] 01/31/2018   Reginia Naas 01/31/2018, 5:38 PM

## 2018-01-31 NOTE — Transfer of Care (Signed)
Immediate Anesthesia Transfer of Care Note  Patient: Phillip Neal  Procedure(s) Performed: LEFT TOTAL HIP ARTHROPLASTY ANTERIOR APPROACH (Left Hip)  Patient Location: PACU  Anesthesia Type:Spinal  Level of Consciousness: awake, alert  and oriented  Airway & Oxygen Therapy: Patient Spontanous Breathing and Patient connected to face mask oxygen  Post-op Assessment: Report given to RN and Post -op Vital signs reviewed and stable  Post vital signs: Reviewed and stable  Last Vitals: There were no vitals filed for this visit.  Last Pain: There were no vitals filed for this visit.       Complications: No apparent anesthesia complications

## 2018-02-01 LAB — BASIC METABOLIC PANEL
ANION GAP: 8 (ref 5–15)
BUN: 26 mg/dL — AB (ref 6–20)
CALCIUM: 8.5 mg/dL — AB (ref 8.9–10.3)
CO2: 21 mmol/L — AB (ref 22–32)
CREATININE: 1.28 mg/dL — AB (ref 0.61–1.24)
Chloride: 107 mmol/L (ref 101–111)
GFR calc Af Amer: 58 mL/min — ABNORMAL LOW (ref >60)
GFR, EST NON AFRICAN AMERICAN: 50 mL/min — AB (ref >60)
GLUCOSE: 270 mg/dL — AB (ref 65–99)
Potassium: 4.6 mmol/L (ref 3.5–5.1)
Sodium: 136 mmol/L (ref 135–145)

## 2018-02-01 LAB — CBC
HCT: 35.2 % — ABNORMAL LOW (ref 39.0–52.0)
Hemoglobin: 11.7 g/dL — ABNORMAL LOW (ref 13.0–17.0)
MCH: 33.3 pg (ref 26.0–34.0)
MCHC: 33.2 g/dL (ref 30.0–36.0)
MCV: 100.3 fL — AB (ref 78.0–100.0)
PLATELETS: 220 10*3/uL (ref 150–400)
RBC: 3.51 MIL/uL — ABNORMAL LOW (ref 4.22–5.81)
RDW: 13.6 % (ref 11.5–15.5)
WBC: 13 10*3/uL — AB (ref 4.0–10.5)

## 2018-02-01 LAB — GLUCOSE, CAPILLARY
GLUCOSE-CAPILLARY: 209 mg/dL — AB (ref 65–99)
GLUCOSE-CAPILLARY: 226 mg/dL — AB (ref 65–99)
Glucose-Capillary: 230 mg/dL — ABNORMAL HIGH (ref 65–99)
Glucose-Capillary: 152 mg/dL — ABNORMAL HIGH (ref 65–99)

## 2018-02-01 MED ORDER — TRAMADOL HCL 50 MG PO TABS: 50.0000 mg | ORAL_TABLET | Freq: Four times a day (QID) | ORAL | 0 refills | Status: DC | PRN

## 2018-02-01 MED ORDER — METHOCARBAMOL 500 MG PO TABS: 500.0000 mg | ORAL_TABLET | Freq: Four times a day (QID) | ORAL | 0 refills | Status: DC | PRN

## 2018-02-01 MED ORDER — RIVAROXABAN 10 MG PO TABS: 10.0000 mg | ORAL_TABLET | Freq: Every day | ORAL | 0 refills | Status: DC

## 2018-02-01 MED ORDER — OXYCODONE HCL 5 MG PO TABS: 5.0000 mg | ORAL_TABLET | ORAL | 0 refills | Status: DC | PRN

## 2018-02-01 NOTE — Discharge Summary (Signed)
Physician Discharge Summary   Patient ID: Phillip Neal MRN: [id number] DOB/AGE: 1931-02-25 84 y.o.  Admit date: 01/31/2018 Discharge date: 02-02-2018  Primary Diagnosis:  Osteoarthritis of the Left  hip.    Admission Diagnoses:  Past Medical History:  Diagnosis Date  . Chronic kidney disease   . Diabetes mellitus without complication ([place])   . Hyperlipidemia   . Hypertension    Discharge Diagnoses:   Principal Problem:   OA (osteoarthritis) of hip  Estimated body mass index is 32.78 kg/m as calculated from the following:   Height as of this encounter: '5\' 9"'$  (1.753 m).   Weight as of this encounter: 100.7 kg (222 lb).  Procedure(s) (LRB): LEFT TOTAL HIP ARTHROPLASTY ANTERIOR APPROACH (Left)   Consults: None  HPI: Phillip Neal is a 84 y.o. male who has advanced end-  stage arthritis of their Left  hip with progressively worsening pain and  dysfunction.The patient has failed nonoperative management and presents for  total hip arthroplasty.    Laboratory Data: Admission on 01/31/2018  Component Date Value Ref Range Status  . Glucose-Capillary 01/31/2018 121* 65 - 99 mg/dL Final  . Glucose-Capillary 01/31/2018 106* 65 - 99 mg/dL Final  . Comment 1 01/31/2018 Notify RN   Final  . Comment 2 01/31/2018 Document in Chart   Final  . WBC 02/01/2018 13.0* 4.0 - 10.5 K/uL Final  . RBC 02/01/2018 3.51* 4.22 - 5.81 MIL/uL Final  . Hemoglobin 02/01/2018 11.7* 13.0 - 17.0 g/dL Final  . HCT 02/01/2018 35.2* 39.0 - 52.0 % Final  . MCV 02/01/2018 100.3* 78.0 - 100.0 fL Final  . MCH 02/01/2018 33.3  26.0 - 34.0 pg Final  . MCHC 02/01/2018 33.2  30.0 - 36.0 g/dL Final  . RDW 02/01/2018 13.6  11.5 - 15.5 % Final  . Platelets 02/01/2018 220  150 - 400 K/uL Final   Performed at [facility], [place] [address], [place], [place] [address]  . Sodium 02/01/2018 136  135 - 145 mmol/L Final  . Potassium 02/01/2018 4.6  3.5 - 5.1 mmol/L Final  . Chloride 02/01/2018  107  101 - 111 mmol/L Final  . CO2 02/01/2018 21* 22 - 32 mmol/L Final  . Glucose, Bld 02/01/2018 270* 65 - 99 mg/dL Final  . BUN 02/01/2018 26* 6 - 20 mg/dL Final  . Creatinine, Ser 02/01/2018 1.28* 0.61 - 1.24 mg/dL Final  . Calcium 02/01/2018 8.5* 8.9 - 10.3 mg/dL Final  . GFR calc non Af Amer 02/01/2018 50* >60 mL/min Final  . GFR calc Af Amer 02/01/2018 58* >60 mL/min Final   Comment: (NOTE) The eGFR has been calculated using the CKD EPI equation. This calculation has not been validated in all clinical situations. eGFR's persistently <60 mL/min signify possible Chronic Kidney Disease.   Georgiann Hahn gap 02/01/2018 8  5 - 15 Final   Performed at [facility], [place] [address] [place], [place] [address]  . Glucose-Capillary 01/31/2018 129* 65 - 99 mg/dL Final  . Glucose-Capillary 01/31/2018 282* 65 - 99 mg/dL Final  . Glucose-Capillary 02/01/2018 230* 65 - 99 mg/dL Final  . Glucose-Capillary 02/01/2018 152* 65 - 99 mg/dL Final  Hospital Outpatient Visit on 01/25/2018  Component Date Value Ref Range Status  . MRSA, PCR 01/25/2018 NEGATIVE  NEGATIVE Final  . Staphylococcus aureus 01/25/2018 NEGATIVE  NEGATIVE Final   Comment: (NOTE) The Xpert SA Assay (FDA approved for NASAL specimens in patients 16 years of age and older), is one component of a  comprehensive surveillance program. It is not intended to diagnose infection nor to guide or monitor treatment. Performed at [facility], [place] [address] [place], [place] [address]   . aPTT 01/25/2018 28  24 - 36 seconds Final   Performed at [facility], [place] [address], [place], [place] [address]  . WBC 01/25/2018 12.1* 4.0 - 10.5 K/uL Final  . RBC 01/25/2018 4.43  4.22 - 5.81 MIL/uL Final  . Hemoglobin 01/25/2018 14.8  13.0 - 17.0 g/dL Final  . HCT 01/25/2018 44.7  39.0 - 52.0 % Final  . MCV 01/25/2018 100.9* 78.0 - 100.0 fL Final  . MCH 01/25/2018 33.4  26.0 - 34.0 pg Final  .  MCHC 01/25/2018 33.1  30.0 - 36.0 g/dL Final  . RDW 01/25/2018 13.3  11.5 - 15.5 % Final  . Platelets 01/25/2018 271  150 - 400 K/uL Final   Performed at [facility], [place] [address] [place], [place] [address]  . Sodium 01/25/2018 139  135 - 145 mmol/L Final  . Potassium 01/25/2018 4.5  3.5 - 5.1 mmol/L Final  . Chloride 01/25/2018 108  101 - 111 mmol/L Final  . CO2 01/25/2018 24  22 - 32 mmol/L Final  . Glucose, Bld 01/25/2018 109* 65 - 99 mg/dL Final  . BUN 01/25/2018 30* 6 - 20 mg/dL Final  . Creatinine, Ser 01/25/2018 1.26* 0.61 - 1.24 mg/dL Final  . Calcium 01/25/2018 9.3  8.9 - 10.3 mg/dL Final  . Total Protein 01/25/2018 6.6  6.5 - 8.1 g/dL Final  . Albumin 01/25/2018 3.6  3.5 - 5.0 g/dL Final  . AST 01/25/2018 17  15 - 41 U/L Final  . ALT 01/25/2018 18  17 - 63 U/L Final  . Alkaline Phosphatase 01/25/2018 58  38 - 126 U/L Final  . Total Bilirubin 01/25/2018 0.8  0.3 - 1.2 mg/dL Final  . GFR calc non Af Amer 01/25/2018 51* >60 mL/min Final  . GFR calc Af Amer 01/25/2018 59* >60 mL/min Final   Comment: (NOTE) The eGFR has been calculated using the CKD EPI equation. This calculation has not been validated in all clinical situations. eGFR's persistently <60 mL/min signify possible Chronic Kidney Disease.   Georgiann Hahn gap 01/25/2018 7  5 - 15 Final   Performed at [facility], [place] [address], [place], [place] [address]  . Prothrombin Time 01/25/2018 12.4  11.4 - 15.2 seconds Final  . INR 01/25/2018 0.94   Final   Performed at [facility], [place] [address] [place], [place] [address]  . ABO/RH(D) 01/25/2018 O POS   Final  . Antibody Screen 01/25/2018 NEG   Final  . Sample Expiration 01/25/2018 02/03/2018   Final  . Extend sample reason 01/25/2018    Final                   Value:NO TRANSFUSIONS OR PREGNANCY IN THE PAST 3 MONTHS Performed at [facility], [place] [address], [place], [place] [address]   .  Glucose-Capillary 01/25/2018 117* 65 - 99 mg/dL Final  . Hgb A1c MFr Bld 01/25/2018 6.8* 4.8 - 5.6 % Final   Comment: (NOTE) Pre diabetes:          5.7%-6.4% Diabetes:              >6.4% Glycemic control for   <7.0% adults with diabetes   . Mean Plasma Glucose 01/25/2018 148.46  mg/dL Final   Performed at  [facility], [place] [address], [place], [place] [address]  . ABO/RH(D) 01/25/2018    Final                   Value:O POS Performed at [facility], [place] [address] [place], [place] [address]      X-Rays:Dg Pelvis Portable  Result Date: 01/31/2018 CLINICAL DATA:  Post LEFT total hip arthroplasty EXAM: PORTABLE PELVIS 1-2 VIEWS COMPARISON:  Portable exam 1438 hours without priors for comparison FINDINGS: Bones appear demineralized. LEFT hip prosthesis identified without fracture or dislocation. Surgical drain overlies the proximal LEFT femur and hip joint. Advanced osteoarthritic changes of the RIGHT hip joint. IMPRESSION: LEFT hip prosthesis without acute complication. Osteoarthritic changes RIGHT hip joint. Electronically Signed   By: Lavonia Dana M.D.   On: 01/31/2018 15:06   Dg C-arm 1-60 Min-no Report  Result Date: 01/31/2018 Fluoroscopy was utilized by the requesting physician.  No radiographic interpretation.    EKG: Orders placed or performed in visit on 09/27/17  . EKG 12-Lead     Hospital Course: Patient was admitted to [facility] and taken to the OR and underwent the above state procedure without complications.  Patient tolerated the procedure well and was later transferred to the recovery room and then to the orthopaedic floor for postoperative care.  They were given PO and IV analgesics for pain control following their surgery.  They were given 24 hours of postoperative antibiotics of  Anti-infectives (From admission, onward)   Start     Dose/Rate Route Frequency Ordered Stop   01/31/18 2000  ceFAZolin (ANCEF) IVPB 2g/100 mL premix     2  g 200 mL/hr over 30 Minutes Intravenous Every 6 hours 01/31/18 1554 02/01/18 0508   01/31/18 1104  ceFAZolin (ANCEF) IVPB 2g/100 mL premix     2 g 200 mL/hr over 30 Minutes Intravenous On call to O.R. 01/31/18 1104 01/31/18 1318   01/31/18 1104  ceFAZolin (ANCEF) 2-4 GM/100ML-% IVPB    Comments:  Whitlow, Cheryl   : cabinet override      01/31/18 1104 01/31/18 1318     and started on DVT prophylaxis in the form of Xarelto.   PT and OT were ordered for total hip protocol.  The patient was allowed to be WBAT with therapy. Discharge planning was consulted to help with postop disposition and equipment needs.  Patient had a good night on the evening of surgery.  They started to get up OOB with therapy on day one.  Hemovac drain was pulled without difficulty.  Continued to work with therapy into day two.  Dressing was changed on day two and the incision was healing well.  Patient was seen in rounds and was ready to go home. Discussed family concerns about home situation.  Arranged for five HHPT visits prior to discharge.   Diet - Cardiac diet, Diabetic diet and Renal diet Follow up - in 2 weeks Activity - WBAT Disposition - Home Condition Upon Discharge - Stable D/C Meds - See DC Summary DVT Prophylaxis - Xarelto     Discharge Instructions    Call MD / Call 911   Complete by:  As directed    If you experience chest pain or shortness of breath, CALL 911 and be transported to the hospital emergency room.  If you develope a fever above 101 F, pus (white drainage) or increased drainage or redness at the wound, or calf pain, call your surgeon's office.  Change dressing   Complete by:  As directed    You may change your dressing dressing daily with sterile 4 x 4 inch gauze dressing and paper tape.  Do not submerge the incision under water.   Constipation Prevention   Complete by:  As directed    Drink plenty of fluids.  Prune juice may be helpful.  You may use a stool softener, such as Colace  (over the counter) 100 mg twice a day.  Use MiraLax (over the counter) for constipation as needed.   Diet - low sodium heart healthy   Complete by:  As directed    Discharge instructions   Complete by:  As directed    Take Xarelto for two and a half more weeks, then discontinue Xarelto. Once the patient has completed the Xarelto, they may resume the 81 mg Aspirin.  Pick up stool softner and laxative for home use following surgery while on pain medications. Do not submerge incision under water. Please use good hand washing techniques while changing dressing each day. May shower starting three days after surgery. Please use a clean towel to pat the incision dry following showers. Continue to use ice for pain and swelling after surgery. Do not use any lotions or creams on the incision until instructed by your surgeon.  Wear both TED hose on both legs during the day every day for three weeks, but may remove the TED hose at night at home.  Postoperative Constipation Protocol  Constipation - defined medically as fewer than three stools per week and severe constipation as less than one stool per week.  One of the most common issues patients have following surgery is constipation.  Even if you have a regular bowel pattern at home, your normal regimen is likely to be disrupted due to multiple reasons following surgery.  Combination of anesthesia, postoperative narcotics, change in appetite and fluid intake all can affect your bowels.  In order to avoid complications following surgery, here are some recommendations in order to help you during your recovery period.  Colace (docusate) - Pick up an over-the-counter form of Colace or another stool softener and take twice a day as long as you are requiring postoperative pain medications.  Take with a full glass of water daily.  If you experience loose stools or diarrhea, hold the colace until you stool forms back up.  If your symptoms do not get better  within 1 week or if they get worse, check with your doctor.  Dulcolax (bisacodyl) - Pick up over-the-counter and take as directed by the product packaging as needed to assist with the movement of your bowels.  Take with a full glass of water.  Use this product as needed if not relieved by Colace only.   MiraLax (polyethylene glycol) - Pick up over-the-counter to have on hand.  MiraLax is a solution that will increase the amount of water in your bowels to assist with bowel movements.  Take as directed and can mix with a glass of water, juice, soda, coffee, or tea.  Take if you go more than two days without a movement. Do not use MiraLax more than once per day. Call your doctor if you are still constipated or irregular after using this medication for 7 days in a row.  If you continue to have problems with postoperative constipation, please contact the office for further assistance and recommendations.  If you experience "the worst abdominal pain ever" or develop nausea or vomiting, please contact the  office immediatly for further recommendations for treatment.   Do not sit on low chairs, stoools or toilet seats, as it may be difficult to get up from low surfaces   Complete by:  As directed    Driving restrictions   Complete by:  As directed    No driving until released by the physician.   Increase activity slowly as tolerated   Complete by:  As directed    Lifting restrictions   Complete by:  As directed    No lifting until released by the physician.   Patient may shower   Complete by:  As directed    You may shower without a dressing once there is no drainage.  Do not wash over the wound.  If drainage remains, do not shower until drainage stops.   TED hose   Complete by:  As directed    Use stockings (TED hose) for 3 weeks on both leg(s).  You may remove them at night for sleeping.   Weight bearing as tolerated   Complete by:  As directed    Laterality:  left   Extremity:  Lower      Allergies as of 02/01/2018      Reactions   Ciprofloxacin Other (See Comments)   Unknown   Statins Other (See Comments)   Unknown   Sulfamethoxazole Itching, Rash    Med Rec must be completed prior to using this [facility]      Discharge Care Instructions  (From admission, onward)        Start     Ordered   02/01/18 0000  Weight bearing as tolerated    Question Answer Comment  Laterality left   Extremity Lower      02/01/18 1800   02/01/18 0000  Change dressing    Comments:  You may change your dressing dressing daily with sterile 4 x 4 inch gauze dressing and paper tape.  Do not submerge the incision under water.   02/01/18 1800     Follow-up Information    Gaynelle Arabian, MD. Schedule an appointment as soon as possible for a visit on 02/13/2018.   Specialty:  Orthopedic Surgery Contact information: [address] [place] [address] [phone]           Signed: Arlee Muslim, PA-C Orthopaedic Surgery 02/01/2018, 6:01 PM

## 2018-02-01 NOTE — Progress Notes (Signed)
Physical Therapy Treatment Patient Details Name: Francisca Harbuck MRN: [id number] DOB: 05-09-1931 Today's Date: 02/01/2018    History of Present Illness Patient is an 84 y/o male s/p L anterior approach THA.    PT Comments    Progressing with mobility.    Follow Up Recommendations  Home health PT     Equipment Recommendations  None recommended by PT    Recommendations for Other Services       Precautions / Restrictions Precautions Precautions: Fall Restrictions Weight Bearing Restrictions: No    Mobility  Bed Mobility Overal bed mobility: Needs Assistance Bed Mobility: Supine to Sit     Supine to sit: HOB elevated;Modified independent (Device/Increase time)     General bed mobility comments: oob in recliner  Transfers Overall transfer level: Needs assistance Equipment used: Rolling walker (2 wheeled) Transfers: Sit to/from Stand Sit to Stand: Supervision Stand pivot transfers: Supervision       General transfer comment: VCs safety, hand placement.  Ambulation/Gait Ambulation/Gait assistance: Min guard Ambulation Distance (Feet): 95 Feet Assistive device: Rolling walker (2 wheeled) Gait Pattern/deviations: Decreased stride length     General Gait Details: close guard for safety.    Stairs            Wheelchair Mobility    Modified Rankin (Stroke Patients Only)       Balance Overall balance assessment: Needs assistance Sitting-balance support: Feet supported Sitting balance-Leahy Scale: Good     Standing balance support: Bilateral upper extremity supported Standing balance-Leahy Scale: Fair                              Cognition Arousal/Alertness: Awake/alert Behavior During Therapy: WFL for tasks assessed/performed Overall Cognitive Status: Within Functional Limits for tasks assessed                                        Exercises      General Comments General comments (skin integrity, edema,  etc.): Pt daughter was in room       Pertinent Vitals/Pain Pain Assessment: 0-10 Pain Score: 4  Faces Pain Scale: Hurts a little bit Pain Location: L hip Pain Descriptors / Indicators: Sore Pain Intervention(s): Monitored during session;Repositioned;Ice applied    Home Living Family/patient expects to be discharged to:: Private residence Living Arrangements: Spouse/significant other Available Help at Discharge: Family;Available PRN/intermittently(Pt wife has arthritis and is 66 y/o) Type of Home: House Home Access: Stairs to enter Entrance Stairs-Rails: Right;Left Home Layout: One level Home Equipment: Environmental consultant - 2 wheels;Shower seat;Bedside commode      Prior Function Level of Independence: Independent      Comments: gardens and does yard work   PT Goals (current goals can now be found in the care plan section) Acute Rehab PT Goals Patient Stated Goal: Go home when able Progress towards PT goals: Progressing toward goals    Frequency    7X/week      PT Plan Current plan remains appropriate    Co-evaluation              AM-PAC PT "6 Clicks" Daily Activity  Outcome Measure  Difficulty turning over in bed (including adjusting bedclothes, sheets and blankets)?: A Little Difficulty moving from lying on back to sitting on the side of the bed? : Unable Difficulty sitting down on and standing up from a chair with arms (  e.g., wheelchair, bedside commode, etc,.)?: Unable Help needed moving to and from a bed to chair (including a wheelchair)?: A Little Help needed walking in hospital room?: A Little Help needed climbing 3-5 steps with a railing? : A Little 6 Click Score: 14    End of Session Equipment Utilized During Treatment: Gait belt Activity Tolerance: Patient tolerated treatment well Patient left: in chair;with call bell/phone within reach;with chair alarm set;with family/visitor present   PT Visit Diagnosis: Pain;Difficulty in walking, not elsewhere  classified (R26.2) Pain - Right/Left: Left Pain - part of body: Hip     Time: 8110-3159 PT Time Calculation (min) (ACUTE ONLY): 12 min  Charges:  $Gait Training: 8-22 mins                    G Codes:          Weston Anna, MPT Pager: [phone]

## 2018-02-01 NOTE — Evaluation (Signed)
Occupational Therapy Evaluation Patient Details Name: Phillip Neal MRN: [id number] DOB: Feb 01, 1931 Today's Date: 02/01/2018    History of Present Illness Patient is an 84 y/o male s/p L anterior approach THA.   Clinical Impression   Pt admitted as above currently supervision-min A level for functional transfers and LB ADL's. He will have intermittent assist from family at d/c and may benefit from [facility] a/e as discussed with pt/daughter as pt wife is elderly and has difficulty bending to assist with LB ADL's. Handouts provided for a/e, energy conservation techniques, discussed removal of throw rugs at home and overall set-up for safety. Pt performed shower transfer at supervision level, has DME. Will sign off acute OT at this time.    Follow Up Recommendations  No OT follow up;Supervision - Intermittent    Equipment Recommendations  None recommended by OT(Pt reports that he has DME)    Recommendations for Other Services       Precautions / Restrictions Precautions Precautions: Fall Restrictions Weight Bearing Restrictions: No      Mobility Bed Mobility Overal bed mobility: Needs Assistance Bed Mobility: Supine to Sit     Supine to sit: HOB elevated;Modified independent (Device/Increase time)        Transfers Overall transfer level: Needs assistance Equipment used: Rolling walker (2 wheeled) Transfers: Sit to/from [facility] Sit to Stand: Supervision Stand pivot transfers: Supervision            Balance Overall balance assessment: Needs assistance Sitting-balance support: Feet supported Sitting balance-Leahy Scale: Good     Standing balance support: Bilateral upper extremity supported Standing balance-Leahy Scale: Fair                             ADL either performed or assessed with clinical judgement   ADL Overall ADL's : Needs assistance/impaired Eating/Feeding: Independent;Sitting   Grooming: Wash/dry  hands;Supervision/safety;Standing   Upper Body Bathing: Set up;Sitting   Lower Body Bathing: Min guard;Sit to/from stand   Upper Body Dressing : Set up;Sitting   Lower Body Dressing: Min guard;Sit to/from stand   Toilet Transfer: Supervision/safety;BSC;Ambulation;RW   Toileting- Water quality scientist and Hygiene: Supervision/safety;Sit to/from stand   Tub/ Shower Transfer: Walk-in shower;Supervision/safety;Ambulation;Rolling walker Tub/Shower Transfer Details (indicate cue type and reason): Pt performed shower transfer using RW, has shower chair at home, discussed use, care and precautions. Pt was overall supervision getting into and out of shower using RW. Functional mobility during ADLs: Supervision/safety;Rolling walker General ADL Comments: Pt was assessed by OT followed be participation in ADL retraining session with focus on pt/family education re: possible LH a/e for LB ADL's as pt daughter states that pt mother is 36 y/o and not able to assist much for LB ADL's. Handout were issued and reviewed w/ pt/family for a/e and energy conservation for home recommendations. Pt also performed shower and toilet transfers. Pt denies further acute OT needs at this time, will sign off.     Vision Baseline Vision/History: Wears glasses Wears Glasses: At all times Patient Visual Report: No change from baseline       Perception     Praxis      Pertinent Vitals/Pain Pain Assessment: Faces Faces Pain Scale: Hurts a little bit Pain Location: L hip Pain Descriptors / Indicators: Sore Pain Intervention(s): Limited activity within patient's tolerance;Repositioned;Monitored during session;Patient requesting pain meds-RN notified;Ice applied     Hand Dominance Right   Extremity/Trunk Assessment Upper Extremity Assessment Upper Extremity Assessment: Overall WFL for  tasks assessed(Grossly 5/5 bilateral UE's)   Lower Extremity Assessment Lower Extremity Assessment: Defer to PT evaluation        Communication Communication Communication: HOH   Cognition Arousal/Alertness: Awake/alert Behavior During Therapy: WFL for tasks assessed/performed Overall Cognitive Status: Within Functional Limits for tasks assessed                                     General Comments  Pt daughter was in room     Exercises     Shoulder Instructions      Home Living Family/patient expects to be discharged to:: Private residence Living Arrangements: Spouse/significant other Available Help at Discharge: Family;Available PRN/intermittently(Pt wife has arthritis and is 106 y/o) Type of Home: House Home Access: Stairs to enter [employer] of Steps: 6 Entrance Stairs-Rails: Right;Left Home Layout: One level     Bathroom Shower/Tub: Walk-in shower(~6 inch step)   Bathroom Toilet: Standard     Home Equipment: Environmental consultant - 2 wheels;Shower seat;Bedside commode          Prior Functioning/Environment Level of Independence: Independent        Comments: gardens and does yard work        OT Problem List: Pain      OT Treatment/Interventions:      OT Goals(Current goals can be found in the care plan section) Acute Rehab OT Goals Patient Stated Goal: Go home when able OT Goal Formulation: All assessment and education complete, DC therapy  OT Frequency:     Barriers to D/C:            Co-evaluation              AM-PAC PT "6 Clicks" Daily Activity     Outcome Measure Help from another person eating meals?: None Help from another person taking care of personal grooming?: None Help from another person toileting, which includes using toliet, bedpan, or urinal?: A Little Help from another person bathing (including washing, rinsing, drying)?: A Little Help from another person to put on and taking off regular upper body clothing?: None Help from another person to put on and taking off regular lower body clothing?: A Little 6 Click Score: 21   End of  Session Equipment Utilized During Treatment: Rolling walker Nurse Communication: Patient requests pain meds;Mobility status  Activity Tolerance: Patient tolerated treatment well;No increased pain Patient left: in chair;with call bell/phone within reach;with chair alarm set  OT Visit Diagnosis: Other abnormalities of gait and mobility (R26.89);Pain Pain - Right/Left: Left Pain - part of body: Hip                Time: 7846-9629 OT Time Calculation (min): 33 min Charges:  OT General Charges $OT Visit: 1 Visit OT Evaluation $OT Eval Low Complexity: 1 Low OT Treatments $Self Care/Home Management : 8-22 mins G-Codes:      Helmi Hechavarria Beth Dixon, OTR/L 02/01/2018, 10:19 AM

## 2018-02-01 NOTE — Progress Notes (Signed)
   Subjective: 1 Day Post-Op Procedure(s) (LRB): LEFT TOTAL HIP ARTHROPLASTY ANTERIOR APPROACH (Left) Patient reports pain as mild.   Patient seen in rounds for Dr. Wynelle Link. Patient is well, but has had some minor complaints of pain in the hip, requiring pain medications We will resume therapy today.  Plan is to go Home after hospital stay.  Objective: Vital signs in last 24 hours: Temp:  [97.4 F (36.3 C)-98.7 F (37.1 C)] 97.8 F (36.6 C) (03/07 0527) Pulse Rate:  [63-80] 63 (03/07 0527) Resp:  [15-17] 15 (03/07 0527) BP: (100-143)/(54-76) 108/55 (03/07 0527) SpO2:  [94 %-100 %] 99 % (03/07 0527) Weight:  [100.7 kg (222 lb)] 100.7 kg (222 lb) (03/06 1124)  Intake/Output from previous day:  Intake/Output Summary (Last 24 hours) at 02/01/2018 0940 Last data filed at 02/01/2018 0901 Gross per 24 hour  Intake 4155 ml  Output 2745 ml  Net 1410 ml    Intake/Output this shift: Total I/O In: 240 [P.O.:240] Out: -   Labs: Recent Labs    02/01/18 0540  HGB 11.7*   Recent Labs    02/01/18 0540  WBC 13.0*  RBC 3.51*  HCT 35.2*  PLT 220   Recent Labs    02/01/18 0540  NA 136  K 4.6  CL 107  CO2 21*  BUN 26*  CREATININE 1.28*  GLUCOSE 270*  CALCIUM 8.5*   No results for input(s): LABPT, INR in the last 72 hours.  EXAM General - Patient is Alert, Appropriate and Oriented Extremity - Neurovascular intact Sensation intact distally Intact pulses distally Dorsiflexion/Plantar flexion intact Dressing - dressing C/D/I Motor Function - intact, moving foot and toes well on exam.  Hemovac pulled without difficulty.  Past Medical History:  Diagnosis Date  . Chronic kidney disease   . Diabetes mellitus without complication ([place])   . Hyperlipidemia   . Hypertension     Assessment/Plan: 1 Day Post-Op Procedure(s) (LRB): LEFT TOTAL HIP ARTHROPLASTY ANTERIOR APPROACH (Left) Principal Problem:   OA (osteoarthritis) of hip  Estimated body mass index is 32.78  kg/m as calculated from the following:   Height as of this encounter: 5\' 9"  (1.753 m).   Weight as of this encounter: 100.7 kg (222 lb). Advance diet Up with therapy Plan for discharge tomorrow Discharge home with home health  DVT Prophylaxis - Xarelto Weight Bearing As Tolerated left Leg Hemovac Pulled Begin Therapy  Arlee Muslim, PA-C Orthopaedic Surgery 02/01/2018, 9:41 AM

## 2018-02-01 NOTE — Progress Notes (Signed)
Physical Therapy Treatment Patient Details Name: Phillip Neal MRN: [id number] DOB: 09/16/1931 Today's Date: 02/01/2018    History of Present Illness Patient is an 84 y/o male s/p L anterior approach THA.    PT Comments    Progressing well with mobility.    Follow Up Recommendations  Home health PT     Equipment Recommendations  None recommended by PT    Recommendations for Other Services       Precautions / Restrictions Precautions Precautions: Fall Restrictions Weight Bearing Restrictions: No    Mobility  Bed Mobility Overal bed mobility: Needs Assistance Bed Mobility: Sit to Supine       Sit to supine: Min assist   General bed mobility comments: small amount of assist for L LE but very close to being Min guard  Transfers Overall transfer level: Needs assistance Equipment used: Rolling walker (2 wheeled) Transfers: Sit to/from Stand Sit to Stand: Supervision         General transfer comment: VCs safety, hand placement.  Ambulation/Gait Ambulation/Gait assistance: Min guard Ambulation Distance (Feet): 120 Feet Assistive device: Rolling walker (2 wheeled) Gait Pattern/deviations: Step-to pattern;Step-through pattern;Decreased stride length     General Gait Details: close guard for safety. Pt is beginning to use reciprocal gait pattern.   Stairs            Wheelchair Mobility    Modified Rankin (Stroke Patients Only)       Balance                                            Cognition Arousal/Alertness: Awake/alert Behavior During Therapy: WFL for tasks assessed/performed Overall Cognitive Status: Within Functional Limits for tasks assessed                                        Exercises Total Joint Exercises Ankle Circles/Pumps: AROM;10 reps;Supine Quad Sets: AROM;Both;10 reps;Supine Heel Slides: Left;10 reps;Supine;AAROM Hip ABduction/ADduction: AAROM;Left;10 reps;Supine    General  Comments        Pertinent Vitals/Pain Pain Assessment: 0-10 Pain Score: 5  Pain Location: L hip Pain Descriptors / Indicators: Sore Pain Intervention(s): Monitored during session;Repositioned;Ice applied    Home Living                      Prior Function            PT Goals (current goals can now be found in the care plan section) Progress towards PT goals: Progressing toward goals    Frequency    7X/week      PT Plan Current plan remains appropriate    Co-evaluation              AM-PAC PT "6 Clicks" Daily Activity  Outcome Measure  Difficulty turning over in bed (including adjusting bedclothes, sheets and blankets)?: A Little Difficulty moving from lying on back to sitting on the side of the bed? : A Little Difficulty sitting down on and standing up from a chair with arms (e.g., wheelchair, bedside commode, etc,.)?: A Little Help needed moving to and from a bed to chair (including a wheelchair)?: A Little Help needed walking in hospital room?: A Little Help needed climbing 3-5 steps with a railing? : A Little 6 Click Score: 18  End of Session Equipment Utilized During Treatment: Gait belt Activity Tolerance: Patient tolerated treatment well Patient left: in bed;with call bell/phone within reach   PT Visit Diagnosis: Pain;Difficulty in walking, not elsewhere classified (R26.2) Pain - Right/Left: Left Pain - part of body: Hip     Time: 9166-0600 PT Time Calculation (min) (ACUTE ONLY): 20 min  Charges:  $Gait Training: 8-22 mins                    G Codes:          Weston Anna, MPT Pager: [phone]

## 2018-02-01 NOTE — Progress Notes (Signed)
Spoke with patient at bedside. Confirmed plan for OP PT, already arranged. Has RW and 3n1. [phone] 

## 2018-02-02 LAB — BASIC METABOLIC PANEL
Anion gap: 5 (ref 5–15)
BUN: 30 mg/dL — AB (ref 6–20)
CHLORIDE: 108 mmol/L (ref 101–111)
CO2: 25 mmol/L (ref 22–32)
CREATININE: 1.43 mg/dL — AB (ref 0.61–1.24)
Calcium: 8.6 mg/dL — ABNORMAL LOW (ref 8.9–10.3)
GFR calc Af Amer: 50 mL/min — ABNORMAL LOW (ref >60)
GFR calc non Af Amer: 43 mL/min — ABNORMAL LOW (ref >60)
GLUCOSE: 197 mg/dL — AB (ref 65–99)
Potassium: 4.9 mmol/L (ref 3.5–5.1)
SODIUM: 138 mmol/L (ref 135–145)

## 2018-02-02 LAB — CBC
HCT: 35.1 % — ABNORMAL LOW (ref 39.0–52.0)
HEMOGLOBIN: 11.5 g/dL — AB (ref 13.0–17.0)
MCH: 33 pg (ref 26.0–34.0)
MCHC: 32.8 g/dL (ref 30.0–36.0)
MCV: 100.9 fL — ABNORMAL HIGH (ref 78.0–100.0)
Platelets: 217 10*3/uL (ref 150–400)
RBC: 3.48 MIL/uL — ABNORMAL LOW (ref 4.22–5.81)
RDW: 13.7 % (ref 11.5–15.5)
WBC: 15.5 10*3/uL — ABNORMAL HIGH (ref 4.0–10.5)

## 2018-02-02 LAB — GLUCOSE, CAPILLARY
GLUCOSE-CAPILLARY: 139 mg/dL — AB (ref 65–99)
Glucose-Capillary: 150 mg/dL — ABNORMAL HIGH (ref 65–99)

## 2018-02-02 NOTE — Care Plan (Signed)
DCP changed to [facility] with [facility].  VO's for PT x 5 visits given to Fritzi Mandes.  OP PT will be rescheduled for 02-14-18.

## 2018-02-02 NOTE — Progress Notes (Addendum)
   Subjective: 2 Days Post-Op Procedure(s) (LRB): LEFT TOTAL HIP ARTHROPLASTY ANTERIOR APPROACH (Left) Patient reports pain as mild.  Had a good night. Patient seen in rounds with Dr. Wynelle Link. Family in room at bedside. Patient is well, and has had no acute complaints or problems. Patient is ready to go home  Objective: Vital signs in last 24 hours: Temp:  [97.7 F (36.5 C)-98.7 F (37.1 C)] 97.7 F (36.5 C) (03/08 0559) Pulse Rate:  [55-72] 55 (03/08 0559) Resp:  [16-18] 16 (03/08 0559) BP: (98-130)/(50-61) 130/54 (03/08 0559) SpO2:  [93 %-99 %] 95 % (03/08 0559)  Intake/Output from previous day:  Intake/Output Summary (Last 24 hours) at 02/02/2018 0754 Last data filed at 02/02/2018 0600 Gross per 24 hour  Intake 1388.33 ml  Output 3685 ml  Net -2296.67 ml    Intake/Output this shift: No intake/output data recorded.  Labs: Recent Labs    02/01/18 0540 02/02/18 0553  HGB 11.7* 11.5*   Recent Labs    02/01/18 0540 02/02/18 0553  WBC 13.0* 15.5*  RBC 3.51* 3.48*  HCT 35.2* 35.1*  PLT 220 217   Recent Labs    02/01/18 0540 02/02/18 0553  NA 136 138  K 4.6 4.9  CL 107 108  CO2 21* 25  BUN 26* 30*  CREATININE 1.28* 1.43*  GLUCOSE 270* 197*  CALCIUM 8.5* 8.6*   No results for input(s): LABPT, INR in the last 72 hours.  EXAM: General - Patient is Alert, Appropriate and Oriented Extremity - Neurovascular intact Sensation intact distally Intact pulses distally Dorsiflexion/Plantar flexion intact Incision - clean, dry, no drainage Motor Function - intact, moving foot and toes well on exam.   Assessment/Plan: 2 Days Post-Op Procedure(s) (LRB): LEFT TOTAL HIP ARTHROPLASTY ANTERIOR APPROACH (Left) Procedure(s) (LRB): LEFT TOTAL HIP ARTHROPLASTY ANTERIOR APPROACH (Left) Past Medical History:  Diagnosis Date  . Chronic kidney disease   . Diabetes mellitus without complication ([place])   . Hyperlipidemia   . Hypertension    Principal Problem:   OA  (osteoarthritis) of hip  Estimated body mass index is 32.78 kg/m as calculated from the following:   Height as of this encounter: 5\' 9"  (1.753 m).   Weight as of this encounter: 100.7 kg (222 lb). Up with therapy Diet - Cardiac diet, Diabetic diet and Renal diet Follow up - in 2 weeks Activity - WBAT Disposition - Home Condition Upon Discharge - Stable D/C Meds - See DC Summary DVT Prophylaxis - Xarelto  Arlee Muslim, PA-C Orthopaedic Surgery 02/02/2018, 7:54 AM  Discusses family concerns about home situation.  Will arrange for five HHPT visits prior to discharge.  Arlee Muslim, PA-C

## 2018-02-02 NOTE — Plan of Care (Signed)
Care plan  

## 2018-02-02 NOTE — Progress Notes (Signed)
Physical Therapy Treatment Patient Details Name: Phillip Neal MRN: [id number] DOB: 07-06-1931 Today's Date: 02/02/2018    History of Present Illness Patient is an 84 y/o male s/p L anterior approach THA.    PT Comments    POD # 2 pm session daughter present.  Instructed pt how to use a belt to self assist L LE off bed.  Assisted with amb then practiced stairs.  Pt c/o dizziness "Vertigo" but no signs of nystagmus.  BP also taken WNL.  Pt states the dizziness happens when he gets up.  Allowed increased time to rise.  Pt performed stairs well despite cont c/o dizziness.  (? Pain meds)  Returned to room, addressed all mobility questions.  Pt has met goals to D/C to home with daughter.    Follow Up Recommendations  Home health PT     Equipment Recommendations  None recommended by PT    Recommendations for Other Services       Precautions / Restrictions Precautions Precautions: Fall Restrictions Weight Bearing Restrictions: No    Mobility  Bed Mobility Overal bed mobility: Needs Assistance Bed Mobility: Supine to Sit     Supine to sit: Min guard Sit to supine: Min guard   General bed mobility comments: instructed pt how to use a belt to self assist L LE off bed  Transfers Overall transfer level: Needs assistance Equipment used: Rolling walker (2 wheeled) Transfers: Sit to/from Stand Sit to Stand: Supervision Stand pivot transfers: Supervision       General transfer comment: 25% VCs safety, hand placement and safety with turns  Ambulation/Gait Ambulation/Gait assistance: Min guard Ambulation Distance (Feet): 65 Feet Assistive device: Rolling walker (2 wheeled) Gait Pattern/deviations: Step-to pattern;Step-through pattern;Decreased stride length Gait velocity: decreased   General Gait Details: 25% VC's safety with turns and proper walker to self distance.   Stairs Stairs: Yes   Stair Management: Two rails;Forwards;Step to pattern Number of Stairs:  4 General stair comments: with daughter present to observe pt performing stairs with 25% VC's on proper sequencing and safe handling.  Pt did well.  Wheelchair Mobility    Modified Rankin (Stroke Patients Only)       Balance                                            Cognition Arousal/Alertness: Awake/alert Behavior During Therapy: WFL for tasks assessed/performed Overall Cognitive Status: Within Functional Limits for tasks assessed                                        Exercises Total Joint Exercises Ankle Circles/Pumps: AROM;10 reps;Supine Quad Sets: AROM;Both;10 reps;Supine Heel Slides: Left;10 reps;Supine;AAROM Hip ABduction/ADduction: AAROM;Left;10 reps;Supine    General Comments        Pertinent Vitals/Pain Pain Assessment: 0-10 Pain Score: 4  Pain Location: L hip Pain Descriptors / Indicators: Sore;Operative site guarding Pain Intervention(s): Monitored during session;Repositioned;Premedicated before session    Home Living                      Prior Function            PT Goals (current goals can now be found in the care plan section) Progress towards PT goals: Progressing toward goals    Frequency  7X/week      PT Plan Current plan remains appropriate    Co-evaluation              AM-PAC PT "6 Clicks" Daily Activity  Outcome Measure  Difficulty turning over in bed (including adjusting bedclothes, sheets and blankets)?: A Little Difficulty moving from lying on back to sitting on the side of the bed? : A Little Difficulty sitting down on and standing up from a chair with arms (e.g., wheelchair, bedside commode, etc,.)?: A Little Help needed moving to and from a bed to chair (including a wheelchair)?: A Little Help needed walking in hospital room?: A Little Help needed climbing 3-5 steps with a railing? : A Little 6 Click Score: 15    End of Session Equipment Utilized During Treatment:  Gait belt Activity Tolerance: Patient tolerated treatment well Patient left: in chair;with call bell/phone within reach;with family/visitor present Nurse Communication: Mobility status(pt has met goals to D/C to home with daughter) PT Visit Diagnosis: Pain;Difficulty in walking, not elsewhere classified (R26.2) Pain - Right/Left: Left Pain - part of body: Hip     Time: 1350-1415 PT Time Calculation (min) (ACUTE ONLY): 25 min  Charges:  $Gait Training: 8-22 mins $Therapeutic Activity: 8-22 mins                    G Codes:       {Phillip Neal  PTA [facility]  Acute  Rehab Pager      [phone]

## 2018-02-02 NOTE — Care Plan (Signed)
L THA 01-31-18 DCP:  Home with spouse. 2 story home with 7 ste. DME:  No needs.  Rx for RW and 3-in-1 given to patient prior to surgery PT:  OP PT eval scheduled at [facility] on 02-05-18.

## 2018-02-02 NOTE — Progress Notes (Signed)
Physical Therapy Treatment Patient Details Name: Phillip Neal MRN: [id number] DOB: 02-27-1931 Today's Date: 02/02/2018    History of Present Illness Patient is an 84 y/o male s/p L anterior approach THA.    PT Comments    Continuing to progress well. Will need to practice stair negotiation prior to d/c later today.    Follow Up Recommendations  Home health PT     Equipment Recommendations  None recommended by PT    Recommendations for Other Services       Precautions / Restrictions Precautions Precautions: Fall Restrictions Weight Bearing Restrictions: No    Mobility  Bed Mobility Overal bed mobility: Needs Assistance Bed Mobility: Supine to Sit;Sit to Supine     Supine to sit: Min guard Sit to supine: Min guard   General bed mobility comments: close guard for safety. Pt used UEs to assist L LE off/onto bed.   Transfers Overall transfer level: Needs assistance Equipment used: Rolling walker (2 wheeled) Transfers: Sit to/from Stand Sit to Stand: Supervision         General transfer comment: VCs safety, hand placement.  Ambulation/Gait Ambulation/Gait assistance: Min guard Ambulation Distance (Feet): 120 Feet Assistive device: Rolling walker (2 wheeled) Gait Pattern/deviations: Step-to pattern;Step-through pattern;Decreased stride length     General Gait Details: close guard for safety. Pt is beginning to use reciprocal gait pattern.   Stairs            Wheelchair Mobility    Modified Rankin (Stroke Patients Only)       Balance                                            Cognition Arousal/Alertness: Awake/alert Behavior During Therapy: WFL for tasks assessed/performed Overall Cognitive Status: Within Functional Limits for tasks assessed                                        Exercises Total Joint Exercises Ankle Circles/Pumps: AROM;10 reps;Supine Quad Sets: AROM;Both;10 reps;Supine Heel  Slides: Left;10 reps;Supine;AAROM Hip ABduction/ADduction: AAROM;Left;10 reps;Supine    General Comments        Pertinent Vitals/Pain Pain Assessment: 0-10 Pain Score: 5  Pain Location: L hip Pain Descriptors / Indicators: Sore Pain Intervention(s): Monitored during session;Repositioned;Ice applied    Home Living                      Prior Function            PT Goals (current goals can now be found in the care plan section) Progress towards PT goals: Progressing toward goals    Frequency    7X/week      PT Plan Current plan remains appropriate    Co-evaluation              AM-PAC PT "6 Clicks" Daily Activity  Outcome Measure  Difficulty turning over in bed (including adjusting bedclothes, sheets and blankets)?: A Little Difficulty moving from lying on back to sitting on the side of the bed? : A Little Difficulty sitting down on and standing up from a chair with arms (e.g., wheelchair, bedside commode, etc,.)?: A Little Help needed moving to and from a bed to chair (including a wheelchair)?: A Little Help needed walking in hospital room?: A Little Help  needed climbing 3-5 steps with a railing? : A Little 6 Click Score: 18    End of Session Equipment Utilized During Treatment: Gait belt Activity Tolerance: Patient tolerated treatment well Patient left: in bed;with call bell/phone within reach;with family/visitor present   PT Visit Diagnosis: Pain;Difficulty in walking, not elsewhere classified (R26.2) Pain - Right/Left: Left Pain - part of body: Hip     Time: 1102-1117 PT Time Calculation (min) (ACUTE ONLY): 24 min  Charges:  $Gait Training: 8-22 mins $Therapeutic Exercise: 8-22 mins                    G Codes:         Weston Anna, MPT Pager: [phone]

## 2018-02-02 NOTE — Progress Notes (Signed)
Spoke with patient and daughter again today. Plan now is for HHPT, patient does not have a preference for [facility] agency. He is concerned that [place] at the office set up something and this will conflict, contacted Sharee Pimple to clarify. Also contacted [facility] for referral to eval and treat. Will continue to follow for updates. [phone]

## 2018-03-06 DIAGNOSIS — Z471 Aftercare following joint replacement surgery: Secondary | ICD-10-CM | POA: Insufficient documentation

## 2018-03-06 HISTORY — DX: Aftercare following joint replacement surgery: Z47.1

## 2018-04-30 ENCOUNTER — Ambulatory Visit (INDEPENDENT_AMBULATORY_CARE_PROVIDER_SITE_OTHER): Payer: Medicare Other | Admitting: Cardiology

## 2018-04-30 ENCOUNTER — Encounter: Payer: Self-pay | Admitting: Cardiology

## 2018-04-30 VITALS — BP 110/68 | HR 63 | Ht 69.0 in | Wt 225.8 lb

## 2018-04-30 DIAGNOSIS — N183 Chronic kidney disease, stage 3 (moderate): Secondary | ICD-10-CM | POA: Diagnosis not present

## 2018-04-30 DIAGNOSIS — E782 Mixed hyperlipidemia: Secondary | ICD-10-CM | POA: Diagnosis not present

## 2018-04-30 DIAGNOSIS — I452 Bifascicular block: Secondary | ICD-10-CM

## 2018-04-30 DIAGNOSIS — I10 Essential (primary) hypertension: Secondary | ICD-10-CM | POA: Diagnosis not present

## 2018-04-30 DIAGNOSIS — E1122 Type 2 diabetes mellitus with diabetic chronic kidney disease: Secondary | ICD-10-CM | POA: Diagnosis not present

## 2018-04-30 NOTE — Patient Instructions (Signed)
Medication Instructions:  Your physician recommends that you continue on your current medications as directed. Please refer to the Current Medication list given to you today.   Labwork: None  Testing/Procedures: None  Follow-Up: Your physician wants you to follow-up in: 3 months. You will receive a reminder letter in the mail two months in advance. If you don't receive a letter, please call our office to schedule the follow-up appointment.   If you need a refill on your cardiac medications before your next appointment, please call your pharmacy.   Thank you for choosing [facility]! Catherine Lockhart, RN [phone]    

## 2018-04-30 NOTE — Progress Notes (Signed)
Cardiology Office Note:    Date:  04/30/2018   ID:  Phillip Neal, DOB [birth year], MRN [id number]  PCP:  Raina Mina., MD  Cardiologist:  Jenne Campus, MD    Referring MD: Raina Mina., MD   Chief Complaint  Patient presents with  . Follow-up  . Pre-op Exam  I need to neurosurgery for hip  History of Present Illness:    Phillip Neal is a 84 y.o. male with multiple risk factors for coronary artery disease likely stress test and echocardiogram done within the year with negative.  Recently he had left hip surgery done went with no difficulties not doing well can walk climb stairs with no difficulties.  Denies having dizziness or passing out actually he does have some dizziness and he being followed by ENT for it.  Past Medical History:  Diagnosis Date  . Chronic kidney disease   . Diabetes mellitus without complication ([place])   . Hyperlipidemia   . Hypertension     Past Surgical History:  Procedure Laterality Date  . HERNIA REPAIR    . TOTAL HIP ARTHROPLASTY Left 01/31/2018   Procedure: LEFT TOTAL HIP ARTHROPLASTY ANTERIOR APPROACH;  Surgeon: Gaynelle Arabian, MD;  Location: [facility];  Service: Orthopedics;  Laterality: Left;    Current Medications: Current Meds  Medication Sig  . clotrimazole-betamethasone (LOTRISONE) cream Apply 1 application topically 2 (two) times daily as needed (for groin rash/itch).   . ezetimibe (ZETIA) 10 MG tablet Take 1 tablet (10 mg total) by mouth daily.  . hydrochlorothiazide (HYDRODIURIL) 12.5 MG tablet Take 12.5 mg by mouth daily.   Marland Kitchen lisinopril (PRINIVIL,ZESTRIL) 40 MG tablet Take 40 mg by mouth daily.   . metFORMIN (GLUCOPHAGE-XR) 500 MG 24 hr tablet Take 1,000 mg by mouth 2 (two) times daily.      Allergies:   Ciprofloxacin; Statins; and Sulfamethoxazole   Social History   Socioeconomic History  . Marital status: Married    Spouse name: Not on file  . Number of children: Not on file  . Years of education: Not on file    . Highest education level: Not on file  Occupational History  . Not on file  Social Needs  . Financial resource strain: Not on file  . Food insecurity:    Worry: Not on file    Inability: Not on file  . Transportation needs:    Medical: Not on file    Non-medical: Not on file  Tobacco Use  . Smoking status: Never Smoker  . Smokeless tobacco: Never Used  Substance and Sexual Activity  . Alcohol use: No  . Drug use: No  . Sexual activity: Not on file  Lifestyle  . Physical activity:    Days per week: Not on file    Minutes per session: Not on file  . Stress: Not on file  Relationships  . Social connections:    Talks on phone: Not on file    Gets together: Not on file    Attends religious service: Not on file    Active member of club or organization: Not on file    Attends meetings of clubs or organizations: Not on file    Relationship status: Not on file  Other Topics Concern  . Not on file  Social History Narrative  . Not on file     Family History: [name] family history includes Heart failure in his mother. ROS:   Please see the history of present illness.    All  14 point review of systems negative except as described per history of present illness  EKGs/Labs/Other Studies Reviewed:      Recent Labs: 01/25/2018: ALT 18 02/02/2018: BUN 30; Creatinine, Ser 1.43; Hemoglobin 11.5; Platelets 217; Potassium 4.9; Sodium 138  Recent Lipid Panel No results found for: CHOL, TRIG, HDL, CHOLHDL, VLDL, LDLCALC, LDLDIRECT  Physical Exam:    VS:  BP 110/68   Pulse 63   Ht 5\' 9"  (1.753 m)   Wt 225 lb 12.8 oz (102.4 kg)   SpO2 96%   BMI 33.34 kg/m     Wt Readings from Last 3 Encounters:  04/30/18 225 lb 12.8 oz (102.4 kg)  01/31/18 222 lb (100.7 kg)  01/25/18 222 lb 2 oz (100.8 kg)     GEN:  Well nourished, well developed in no acute distress HEENT: Normal NECK: No JVD; No carotid bruits LYMPHATICS: No lymphadenopathy CARDIAC: RRR, no murmurs, no rubs, no  gallops RESPIRATORY:  Clear to auscultation without rales, wheezing or rhonchi  ABDOMEN: Soft, non-tender, non-distended MUSCULOSKELETAL:  No edema; No deformity  SKIN: Warm and dry LOWER EXTREMITIES: no swelling NEUROLOGIC:  Alert and oriented x 3 PSYCHIATRIC:  Normal affect   ASSESSMENT:    1. Bifascicular bundle branch block   2. Essential hypertension   3. Diabetes mellitus with stage 3 chronic kidney disease ([place])   4. Mixed hyperlipidemia    PLAN:    In order of problems listed above:  1. Bifascicular bundle branch block with no symptoms.  In the future we will put event recorder for 30 days to see if there is any significant slowing down.  He tells me that he got this problem since he remember. 2. Essential hypertension: Blood pressure well controlled continue present management. 3. Diabetes doing well continue present management. 4. Dyslipidemia on statin. 5.    Medication Adjustments/Labs and Tests Ordered: Current medicines are reviewed at length with the patient today.  Concerns regarding medicines are outlined above.  No orders of the defined types were placed in this encounter.  Medication changes: No orders of the defined types were placed in this encounter.   Signed, Park Liter, MD, [facility] 04/30/2018 4:24 PM    [place]

## 2018-05-01 DIAGNOSIS — K219 Gastro-esophageal reflux disease without esophagitis: Secondary | ICD-10-CM

## 2018-05-01 HISTORY — DX: Gastro-esophageal reflux disease without esophagitis: K21.9

## 2018-05-01 NOTE — Addendum Note (Signed)
Addended by: Austin Miles on: 05/01/2018 08:42 AM   Modules accepted: Orders

## 2018-05-09 NOTE — Patient Instructions (Addendum)
Phillip Neal  05/09/2018   Your procedure is scheduled on: 05/16/2018   Report to [facility] Main  Entrance  Report to admitting at   1230 pm    Call this number if you have problems the morning of surgery [phone]   Remember: Do not eat food  :After Midnight., may have clear liquids from midnight until 900 am day of surgery, then nothing by mouth.      CLEAR LIQUID DIET   Foods Allowed                                                                     Foods Excluded  Coffee and tea, regular and decaf                             liquids that you cannot  Plain Jell-O in any flavor                                             see through such as: Fruit ices (not with fruit pulp)                                     milk, soups, orange juice  Iced Popsicles                                    All solid food Carbonated beverages, regular and diet                                    Cranberry, grape and apple juices Sports drinks like Gatorade Lightly seasoned clear broth or consume(fat free) Sugar, honey syrup  Sample Menu Breakfast                                Lunch                                     Supper Cranberry juice                    Beef broth                            Chicken broth Jell-O                                     Grape juice                           Apple juice Coffee  or tea                        Jell-O                                      Popsicle                                                Coffee or tea                        Coffee or tea  _____________________________________________________________________     Take these medicines the morning of surgery with A SIP OF WATER: Flonase if needed  DO NOT TAKE ANY DIABETIC MEDICATIONS DAY OF YOUR SURGERY                               You may not have any metal on your body including hair pins and              piercings  Do not wear jewelry, , lotions, powders or  perfumes, deodorant             Do not wear nail polish.  Do not shave  48 hours prior to surgery.              Men may shave face and neck.   Do not bring valuables to the hospital. [place].  Contacts, dentures or bridgework may not be worn into surgery.  Leave suitcase in the car. After surgery it may be brought to your room.                   Please read over the following fact sheets you were given: _____________________________________________________________________             [facility] - Preparing for Surgery Before surgery, you can play an important role.  Because skin is not sterile, your skin needs to be as free of germs as possible.  You can reduce the number of germs on your skin by washing with CHG (chlorahexidine gluconate) soap before surgery.  CHG is an antiseptic cleaner which kills germs and bonds with the skin to continue killing germs even after washing. Please DO NOT use if you have an allergy to CHG or antibacterial soaps.  If your skin becomes reddened/irritated stop using the CHG and inform your nurse when you arrive at Short Stay. Do not shave (including legs and underarms) for at least 48 hours prior to the first CHG shower.  You may shave your face/neck. Please follow these instructions carefully:  1.  Shower with CHG Soap the night before surgery and the  morning of Surgery.  2.  If you choose to wash your hair, wash your hair first as usual with your  normal  shampoo.  3.  After you shampoo, rinse your hair and body thoroughly to remove the  shampoo.  4.  Use CHG as you would any other liquid soap.  You can apply chg directly  to the skin and wash                       Gently with a scrungie or clean washcloth.  5.  Apply the CHG Soap to your body ONLY FROM THE NECK DOWN.   Do not use on face/ open                           Wound or open sores. Avoid contact with eyes, ears mouth  and genitals (private parts).                       Wash face,  Genitals (private parts) with your normal soap.             6.  Wash thoroughly, paying special attention to the area where your surgery  will be performed.  7.  Thoroughly rinse your body with warm water from the neck down.  8.  DO NOT shower/wash with your normal soap after using and rinsing off  the CHG Soap.                9.  Pat yourself dry with a clean towel.            10.  Wear clean pajamas.            11.  Place clean sheets on your bed the night of your first shower and do not  sleep with pets. Day of Surgery : Do not apply any lotions/deodorants the morning of surgery.  Please wear clean clothes to the hospital/surgery center.  FAILURE TO FOLLOW THESE INSTRUCTIONS MAY RESULT IN THE CANCELLATION OF YOUR SURGERY PATIENT SIGNATURE_________________________________  NURSE SIGNATURE__________________________________  ________________________________________________________________________  WHAT IS A BLOOD TRANSFUSION? Blood Transfusion Information  A transfusion is the replacement of blood or some of its parts. Blood is made up of multiple cells which provide different functions.  Red blood cells carry oxygen and are used for blood loss replacement.  White blood cells fight against infection.  Platelets control bleeding.  Plasma helps clot blood.  Other blood products are available for specialized needs, such as hemophilia or other clotting disorders. BEFORE THE TRANSFUSION  Who gives blood for transfusions?   Healthy volunteers who are fully evaluated to make sure their blood is safe. This is blood bank blood. Transfusion therapy is the safest it has ever been in the practice of medicine. Before blood is taken from a donor, a complete history is taken to make sure that person has no history of diseases nor engages in risky social behavior (examples are intravenous drug use or sexual activity with multiple  partners). The donor's travel history is screened to minimize risk of transmitting infections, such as malaria. The donated blood is tested for signs of infectious diseases, such as HIV and hepatitis. The blood is then tested to be sure it is compatible with you in order to minimize the chance of a transfusion reaction. If you or a relative donates blood, this is often done in anticipation of surgery and is not appropriate for emergency situations. It takes many days to process the donated blood. RISKS AND COMPLICATIONS Although transfusion therapy is very safe and saves many lives, the main dangers of transfusion include:   Getting an infectious disease.  Developing a transfusion reaction.  This is an allergic reaction to something in the blood you were given. Every precaution is taken to prevent this. The decision to have a blood transfusion has been considered carefully by your caregiver before blood is given. Blood is not given unless the benefits outweigh the risks. AFTER THE TRANSFUSION  Right after receiving a blood transfusion, you will usually feel much better and more energetic. This is especially true if your red blood cells have gotten low (anemic). The transfusion raises the level of the red blood cells which carry oxygen, and this usually causes an energy increase.  The nurse administering the transfusion will monitor you carefully for complications. HOME CARE INSTRUCTIONS  No special instructions are needed after a transfusion. You may find your energy is better. Speak with your caregiver about any limitations on activity for underlying diseases you may have. SEEK MEDICAL CARE IF:   Your condition is not improving after your transfusion.  You develop redness or irritation at the intravenous (IV) site. SEEK IMMEDIATE MEDICAL CARE IF:  Any of the following symptoms occur over the next 12 hours:  Shaking chills.  You have a temperature by mouth above 102 F (38.9 C), not  controlled by medicine.  Chest, back, or muscle pain.  People around you feel you are not acting correctly or are confused.  Shortness of breath or difficulty breathing.  Dizziness and fainting.  You get a rash or develop hives.  You have a decrease in urine output.  Your urine turns a dark color or changes to pink, red, or brown. Any of the following symptoms occur over the next 10 days:  You have a temperature by mouth above 102 F (38.9 C), not controlled by medicine.  Shortness of breath.  Weakness after normal activity.  The white part of the eye turns yellow (jaundice).  You have a decrease in the amount of urine or are urinating less often.  Your urine turns a dark color or changes to pink, red, or brown. Document Released: 11/11/2000 Document Revised: 02/06/2012 Document Reviewed: 06/30/2008 ExitCare Patient Information 2014 [place].  _______________________________________________________________________  Incentive Spirometer  An incentive spirometer is a tool that can help keep your lungs clear and active. This tool measures how well you are filling your lungs with each breath. Taking long deep breaths may help reverse or decrease the chance of developing breathing (pulmonary) problems (especially infection) following:  A long period of time when you are unable to move or be active. BEFORE THE PROCEDURE   If the spirometer includes an indicator to show your best effort, your nurse or respiratory therapist will set it to a desired goal.  If possible, sit up straight or lean slightly forward. Try not to slouch.  Hold the incentive spirometer in an upright position. INSTRUCTIONS FOR USE  1. Sit on the edge of your bed if possible, or sit up as far as you can in bed or on a chair. 2. Hold the incentive spirometer in an upright position. 3. Breathe out normally. 4. Place the mouthpiece in your mouth and seal your lips tightly around it. 5. Breathe in  slowly and as deeply as possible, raising the piston or the ball toward the top of the column. 6. Hold your breath for 3-5 seconds or for as long as possible. Allow the piston or ball to fall to the bottom of the column. 7. Remove the mouthpiece from your mouth and breathe out normally. 8. Rest for a few seconds and repeat Steps  1 through 7 at least 10 times every 1-2 hours when you are awake. Take your time and take a few normal breaths between deep breaths. 9. The spirometer may include an indicator to show your best effort. Use the indicator as a goal to work toward during each repetition. 10. After each set of 10 deep breaths, practice coughing to be sure your lungs are clear. If you have an incision (the cut made at the time of surgery), support your incision when coughing by placing a pillow or rolled up towels firmly against it. Once you are able to get out of bed, walk around indoors and cough well. You may stop using the incentive spirometer when instructed by your caregiver.  RISKS AND COMPLICATIONS  Take your time so you do not get dizzy or light-headed.  If you are in pain, you may need to take or ask for pain medication before doing incentive spirometry. It is harder to take a deep breath if you are having pain. AFTER USE  Rest and breathe slowly and easily.  It can be helpful to keep track of a log of your progress. Your caregiver can provide you with a simple table to help with this. If you are using the spirometer at home, follow these instructions: [place] IF:   You are having difficultly using the spirometer.  You have trouble using the spirometer as often as instructed.  Your pain medication is not giving enough relief while using the spirometer.  You develop fever of 100.5 F (38.1 C) or higher. SEEK IMMEDIATE MEDICAL CARE IF:   You cough up bloody sputum that had not been present before.  You develop fever of 102 F (38.9 C) or greater.  You develop  worsening pain at or near the incision site. MAKE SURE YOU:   Understand these instructions.  Will watch your condition.  Will get help right away if you are not doing well or get worse. Document Released: 03/27/2007 Document Revised: 02/06/2012 Document Reviewed: 05/28/2007 [facility] Patient Information 2014 [place], [place].   ________________________________________________________________________

## 2018-05-09 NOTE — Progress Notes (Addendum)
EKG-04/30/2018-epic Stress-10/05/17-epic ECHO-10/05/17-epic LOV- Dr Krasowski-04/30/2018-epic -preop exam  EKG-09/22/17-on chart

## 2018-05-10 ENCOUNTER — Inpatient Hospital Stay (HOSPITAL_COMMUNITY): Admission: RE | Admit: 2018-05-10 | Payer: Medicare Other | Source: Ambulatory Visit

## 2018-05-10 ENCOUNTER — Encounter (HOSPITAL_COMMUNITY)
Admission: RE | Admit: 2018-05-10 | Discharge: 2018-05-10 | Disposition: A | Discharge status: Home / Self Care | Payer: Medicare Other | Source: Ambulatory Visit | Attending: Orthopedic Surgery | Admitting: Orthopedic Surgery

## 2018-05-10 ENCOUNTER — Encounter (HOSPITAL_COMMUNITY): Payer: Self-pay

## 2018-05-10 ENCOUNTER — Other Ambulatory Visit: Payer: Self-pay

## 2018-05-10 DIAGNOSIS — M1611 Unilateral primary osteoarthritis, right hip: Secondary | ICD-10-CM | POA: Insufficient documentation

## 2018-05-10 DIAGNOSIS — E782 Mixed hyperlipidemia: Secondary | ICD-10-CM | POA: Diagnosis not present

## 2018-05-10 DIAGNOSIS — Z01812 Encounter for preprocedural laboratory examination: Secondary | ICD-10-CM | POA: Insufficient documentation

## 2018-05-10 DIAGNOSIS — Z7984 Long term (current) use of oral hypoglycemic drugs: Secondary | ICD-10-CM | POA: Diagnosis not present

## 2018-05-10 DIAGNOSIS — Z7982 Long term (current) use of aspirin: Secondary | ICD-10-CM | POA: Diagnosis not present

## 2018-05-10 DIAGNOSIS — N183 Chronic kidney disease, stage 3 (moderate): Secondary | ICD-10-CM | POA: Insufficient documentation

## 2018-05-10 DIAGNOSIS — I129 Hypertensive chronic kidney disease with stage 1 through stage 4 chronic kidney disease, or unspecified chronic kidney disease: Secondary | ICD-10-CM | POA: Insufficient documentation

## 2018-05-10 DIAGNOSIS — Z79899 Other long term (current) drug therapy: Secondary | ICD-10-CM | POA: Insufficient documentation

## 2018-05-10 DIAGNOSIS — E1122 Type 2 diabetes mellitus with diabetic chronic kidney disease: Secondary | ICD-10-CM | POA: Insufficient documentation

## 2018-05-10 HISTORY — DX: Cough, unspecified: R05.9

## 2018-05-10 HISTORY — DX: Dizziness and giddiness: R42

## 2018-05-10 HISTORY — DX: Cough: R05

## 2018-05-10 HISTORY — DX: Personal history of urinary calculi: Z87.442

## 2018-05-10 HISTORY — DX: Malignant (primary) neoplasm, unspecified: C80.1

## 2018-05-10 HISTORY — DX: Unspecified osteoarthritis, unspecified site: M19.90

## 2018-05-10 LAB — GLUCOSE, CAPILLARY: Glucose-Capillary: 133 mg/dL — ABNORMAL HIGH (ref 65–99)

## 2018-05-10 LAB — COMPREHENSIVE METABOLIC PANEL
ALT: 20 U/L (ref 17–63)
AST: 21 U/L (ref 15–41)
Albumin: 4 g/dL (ref 3.5–5.0)
Alkaline Phosphatase: 68 U/L (ref 38–126)
Anion gap: 10 (ref 5–15)
BUN: 36 mg/dL — ABNORMAL HIGH (ref 6–20)
CO2: 25 mmol/L (ref 22–32)
Calcium: 9.8 mg/dL (ref 8.9–10.3)
Chloride: 109 mmol/L (ref 101–111)
Creatinine, Ser: 1.29 mg/dL — ABNORMAL HIGH (ref 0.61–1.24)
GFR calc Af Amer: 57 mL/min — ABNORMAL LOW (ref >60)
GFR calc non Af Amer: 49 mL/min — ABNORMAL LOW (ref >60)
Glucose, Bld: 145 mg/dL — ABNORMAL HIGH (ref 65–99)
Potassium: 4.5 mmol/L (ref 3.5–5.1)
Sodium: 144 mmol/L (ref 135–145)
Total Bilirubin: 0.8 mg/dL (ref 0.3–1.2)
Total Protein: 6.9 g/dL (ref 6.5–8.1)

## 2018-05-10 LAB — URINALYSIS, ROUTINE W REFLEX MICROSCOPIC
Bilirubin Urine: NEGATIVE
Glucose, UA: NEGATIVE mg/dL
Hgb urine dipstick: NEGATIVE
Ketones, ur: NEGATIVE mg/dL
Leukocytes, UA: NEGATIVE
Nitrite: NEGATIVE
Protein, ur: NEGATIVE mg/dL
Specific Gravity, Urine: 1.018 (ref 1.005–1.030)
pH: 5 (ref 5.0–8.0)

## 2018-05-10 LAB — HEMOGLOBIN A1C
HEMOGLOBIN A1C: 6.8 % — AB (ref 4.8–5.6)
MEAN PLASMA GLUCOSE: 148.46 mg/dL

## 2018-05-10 LAB — CBC
HCT: 42.2 % (ref 39.0–52.0)
Hemoglobin: 13.7 g/dL (ref 13.0–17.0)
MCH: 32.9 pg (ref 26.0–34.0)
MCHC: 32.5 g/dL (ref 30.0–36.0)
MCV: 101.2 fL — ABNORMAL HIGH (ref 78.0–100.0)
Platelets: 237 10*3/uL (ref 150–400)
RBC: 4.17 MIL/uL — ABNORMAL LOW (ref 4.22–5.81)
RDW: 13.1 % (ref 11.5–15.5)
WBC: 10.3 10*3/uL (ref 4.0–10.5)

## 2018-05-10 LAB — PROTIME-INR
INR: 0.96
Prothrombin Time: 12.7 seconds (ref 11.4–15.2)

## 2018-05-10 LAB — SURGICAL PCR SCREEN
MRSA, PCR: NEGATIVE
Staphylococcus aureus: POSITIVE — AB

## 2018-05-10 LAB — APTT: aPTT: 27 seconds (ref 24–36)

## 2018-05-10 NOTE — H&P (Addendum)
TOTAL HIP ADMISSION H&P  Patient is admitted for right total hip arthroplasty.  Subjective:  Chief Complaint: right hip pain  HPI: Phillip Neal, 84 y.o. male, has a history of pain and functional disability in the right hip(s) due to arthritis and patient has failed non-surgical conservative treatments for greater than 12 weeks to include NSAID's and/or analgesics, flexibility and strengthening excercises and activity modification.  Onset of symptoms was gradual starting 2 years ago with gradually worsening course since that time.The patient noted no past surgery on the right hip(s).  Patient currently rates pain in the right hip at 2 out of 10 with activity. Patient has night pain, worsening of pain with activity and weight bearing, pain that interfers with activities of daily living and pain with passive range of motion. Patient has evidence of subchondral cysts, periarticular osteophytes and joint space narrowing by imaging studies. This condition presents safety issues increasing the risk of falls.  There is no current active infection.  Patient Active Problem List   Diagnosis Date Noted  . OA (osteoarthritis) of hip 01/31/2018  . Atypical chest pain 09/27/2017  . 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/11/2016  . CKD (chronic kidney disease), stage III ([place]) 01/11/2016  . Diabetes mellitus with stage 3 chronic kidney disease ([place]) 01/11/2016  . Essential hypertension 01/11/2016  . Mixed hyperlipidemia 01/11/2016   Past Medical History:  Diagnosis Date  . Chronic kidney disease   . Diabetes mellitus without complication ([place])   . Hyperlipidemia   . Hypertension     Past Surgical History:  Procedure Laterality Date  . HERNIA REPAIR    . TOTAL HIP ARTHROPLASTY Left 01/31/2018   Procedure: LEFT TOTAL HIP ARTHROPLASTY ANTERIOR APPROACH;  Surgeon: Gaynelle Arabian, MD;  Location: [facility];  Service: Orthopedics;  Laterality: Left;       Current Outpatient Medications  Medication Sig  Dispense Refill Last Dose  . aspirin EC 81 MG tablet Take 81 mg by mouth daily.     . clotrimazole-betamethasone (LOTRISONE) cream Apply 1 application topically 2 (two) times daily as needed (for groin rash/itch).    Taking  . Coenzyme Q10 (CO Q 10) 100 MG CAPS Take 100 mg by mouth 2 (two) times daily.     Marland Kitchen ezetimibe (ZETIA) 10 MG tablet Take 1 tablet (10 mg total) by mouth daily. 90 tablet 3 Taking  . fluticasone (FLONASE) 50 MCG/ACT nasal spray Place 2 sprays into both nostrils daily as needed for allergies or rhinitis.     . hydrochlorothiazide (HYDRODIURIL) 12.5 MG tablet Take 12.5 mg by mouth daily.    Taking  . lisinopril (PRINIVIL,ZESTRIL) 40 MG tablet Take 40 mg by mouth daily.    Taking  . meclizine (ANTIVERT) 12.5 MG tablet Take 12.5 mg by mouth 3 (three) times daily as needed for dizziness.     . metFORMIN (GLUCOPHAGE-XR) 500 MG 24 hr tablet Take 1,000 mg by mouth 2 (two) times daily.    Taking  . vitamin B-12 (CYANOCOBALAMIN) 1000 MCG tablet Take 1,000 mcg by mouth daily.     Marland Kitchen oxyCODONE (OXY IR/ROXICODONE) 5 MG immediate release tablet Take 1-2 tablets (5-10 mg total) by mouth every 4 (four) hours as needed for moderate pain (pain score 4-6). (Patient not taking: Reported on 04/30/2018) 60 tablet 0 Completed Course at Unknown time  . traMADol (ULTRAM) 50 MG tablet Take 1-2 tablets (50-100 mg total) by mouth every 6 (six) hours as needed (mild pain). (Patient not taking: Reported on 04/30/2018) 56 tablet 0  Completed Course at Unknown time   Allergies  Allergen Reactions  . Ciprofloxacin Other (See Comments)    Unknown  . Statins Other (See Comments)    Unknown  . Sulfamethoxazole Itching and Rash    Social History   Tobacco Use  . Smoking status: Never Smoker  . Smokeless tobacco: Never Used  Substance Use Topics  . Alcohol use: No    Family History  Problem Relation Age of Onset  . Heart failure Mother      Review of Systems  Constitutional: Negative.   HENT: Positive  for hearing loss. Negative for congestion, ear discharge, ear pain, nosebleeds, sinus pain, sore throat and tinnitus.   Eyes: Negative.   Respiratory: Positive for cough. Negative for hemoptysis, sputum production, shortness of breath, wheezing and stridor.   Cardiovascular: Negative.   Gastrointestinal: Negative.   Genitourinary: Negative.   Musculoskeletal: Positive for joint pain and myalgias. Negative for back pain, falls and neck pain.  Skin: Negative.   Neurological: Positive for dizziness and headaches. Negative for tingling, tremors, sensory change, speech change, focal weakness, seizures, loss of consciousness and weakness.  Endo/Heme/Allergies: Negative.   Psychiatric/Behavioral: Negative.     Objective:  Physical Exam  Constitutional: He is oriented to person, place, and time. He appears well-developed. No distress.  Obese  HENT:  Head: Normocephalic and atraumatic.  Right Ear: External ear normal.  Left Ear: External ear normal.  Nose: Nose normal.  Mouth/Throat: Oropharynx is clear and moist.  Eyes: Conjunctivae and EOM are normal.  Neck: Normal range of motion. Neck supple.  Cardiovascular: Normal rate, regular rhythm, normal heart sounds and intact distal pulses.  No murmur heard. Respiratory: Effort normal and breath sounds normal. No respiratory distress. He has no wheezes.  GI: Soft. Bowel sounds are normal. He exhibits no distension. There is no tenderness.  Musculoskeletal:  Left Hip Range of motion: Flexion 110 degrees, Internal rotation 20 degrees, External rotation 30 degrees, Abduction 30 degrees without discomfort. Right hip Pain with motion of the right hip. Flexion 100 degrees, 10 degrees internal rotation, 20 external rotation and abduction.   Neurological: He is alert and oriented to person, place, and time. He has normal strength. No sensory deficit.  Skin: No rash noted. He is not diaphoretic. No erythema.  Psychiatric: He has a normal mood and  affect. His behavior is normal.    Ht: 5 ft 9 in  Wt: 225 lbs  BMI: 33.2  BP: 130/70 sitting L arm  Pulse: 72 bpm  Pain Scale: 2   Imaging Review Plain radiographs demonstrate severe degenerative joint disease of the right hip(s). The bone quality appears to be good for age and reported activity level.    Preoperative templating of the joint replacement has been completed, documented, and submitted to the Operating Room personnel in order to optimize intra-operative equipment management.     Assessment/Plan:  End stage primary osteoarthritis, right hip(s)  The patient history, physical examination, clinical judgement of the provider and imaging studies are consistent with end stage degenerative joint disease of the right hip(s) and total hip arthroplasty is deemed medically necessary. The treatment options including medical management, injection therapy, arthroscopy and arthroplasty were discussed at length. The risks and benefits of total hip arthroplasty were presented and reviewed. The risks due to aseptic loosening, infection, stiffness, dislocation/subluxation,  thromboembolic complications and other imponderables were discussed.  The patient acknowledged the explanation, agreed to proceed with the plan and consent was signed. Patient is being admitted  for inpatient treatment for surgery, pain control, PT, OT, prophylactic antibiotics, VTE prophylaxis, progressive ambulation and ADL's and discharge planning.The patient is planning to be discharged home with HEP    Therapy Plans: HEP Disposition: Home with wife Planned DVT prophylaxis: Xarelto 10mg  daily DME needed: none PCP: Dr. Bea Graff Cardio: Dr. Agustin Cree Other: TXA IV  Has post op Rx for oxycodone and Robaxin already  [employer], PA-C

## 2018-05-10 NOTE — Progress Notes (Signed)
   05/10/18 1513  [place]  Have you ever been diagnosed with sleep apnea through a sleep study? No  Do you snore loudly (loud enough to be heard through closed doors)?  1  Do you often feel tired, fatigued, or sleepy during the daytime (such as falling asleep during driving or talking to someone)? 0  Has anyone observed you stop breathing during your sleep? 0  Do you have, or are you being treated for high blood pressure? 1  BMI more than 35 kg/m2? 1  Age > 50 (1-yes) 1  Neck circumference greater than:Male 16 inches or larger, Male 17inches or larger? 0  Male Gender (Yes=1) 1  Obstructive Sleep Apnea Score 5  Score 5 or greater  Results sent to PCP

## 2018-05-11 ENCOUNTER — Other Ambulatory Visit: Payer: Self-pay | Admitting: Orthopedic Surgery

## 2018-05-11 NOTE — Progress Notes (Signed)
CMET AND PCR RESULTS SENT VIA Epic FAX TO DR Wynelle Link

## 2018-05-11 NOTE — Progress Notes (Signed)
SPOKE WITH PATIENT BY PHONE PATIENT STATED HE HAD RED IRRITAED AREA WHERE TICK WAS REMOVED FROM GROIN ON Monday, PATIENT STATED HE WAS GOING TO [facility] TODAY TO GET IT LOOKED AT

## 2018-05-11 NOTE — Care Plan (Signed)
Ortho Bundle R THA scheduled on 05-16-18 DCP:  Home with spouse.  2 story/7 ste. DME:  No needs.  Has equipment from previous [facility] in March 2019. PT:  HEP

## 2018-05-15 MED ORDER — SODIUM CHLORIDE 0.9 % IV SOLN
1000.0000 mg | INTRAVENOUS | Status: AC
  Administered 2018-05-16: 1000 mg via INTRAVENOUS
  Filled 2018-05-15: qty 1100

## 2018-05-15 MED ORDER — BUPIVACAINE LIPOSOME 1.3 % IJ SUSP
20.0000 mL | Freq: Once | INTRAMUSCULAR | Status: AC
  Filled 2018-05-15: qty 20

## 2018-05-16 ENCOUNTER — Inpatient Hospital Stay (HOSPITAL_COMMUNITY)
Admission: RE | Admit: 2018-05-16 | Discharge: 2018-05-18 | DRG: 470 | Disposition: A | Discharge status: Home / Self Care | Payer: Medicare Other | Attending: Orthopedic Surgery | Admitting: Orthopedic Surgery

## 2018-05-16 ENCOUNTER — Inpatient Hospital Stay (HOSPITAL_COMMUNITY): Payer: Medicare Other | Admitting: Anesthesiology

## 2018-05-16 ENCOUNTER — Other Ambulatory Visit: Payer: Self-pay

## 2018-05-16 ENCOUNTER — Encounter (HOSPITAL_COMMUNITY)
Admission: RE | Disposition: A | Discharge status: Home / Self Care | Payer: Self-pay | Source: Home / Self Care | Attending: Orthopedic Surgery

## 2018-05-16 ENCOUNTER — Encounter (HOSPITAL_COMMUNITY): Payer: Self-pay | Admitting: Anesthesiology

## 2018-05-16 ENCOUNTER — Inpatient Hospital Stay (HOSPITAL_COMMUNITY): Payer: Medicare Other

## 2018-05-16 DIAGNOSIS — Z8249 Family history of ischemic heart disease and other diseases of the circulatory system: Secondary | ICD-10-CM | POA: Diagnosis not present

## 2018-05-16 DIAGNOSIS — M25551 Pain in right hip: Secondary | ICD-10-CM | POA: Diagnosis present

## 2018-05-16 DIAGNOSIS — I129 Hypertensive chronic kidney disease with stage 1 through stage 4 chronic kidney disease, or unspecified chronic kidney disease: Secondary | ICD-10-CM | POA: Diagnosis present

## 2018-05-16 DIAGNOSIS — M1611 Unilateral primary osteoarthritis, right hip: Secondary | ICD-10-CM | POA: Diagnosis present

## 2018-05-16 DIAGNOSIS — E782 Mixed hyperlipidemia: Secondary | ICD-10-CM | POA: Diagnosis present

## 2018-05-16 DIAGNOSIS — Z7984 Long term (current) use of oral hypoglycemic drugs: Secondary | ICD-10-CM

## 2018-05-16 DIAGNOSIS — Z888 Allergy status to other drugs, medicaments and biological substances status: Secondary | ICD-10-CM

## 2018-05-16 DIAGNOSIS — N183 Chronic kidney disease, stage 3 (moderate): Secondary | ICD-10-CM | POA: Diagnosis present

## 2018-05-16 DIAGNOSIS — Z7982 Long term (current) use of aspirin: Secondary | ICD-10-CM | POA: Diagnosis not present

## 2018-05-16 DIAGNOSIS — Z6834 Body mass index (BMI) 34.0-34.9, adult: Secondary | ICD-10-CM

## 2018-05-16 DIAGNOSIS — Z79899 Other long term (current) drug therapy: Secondary | ICD-10-CM | POA: Diagnosis not present

## 2018-05-16 DIAGNOSIS — Z96642 Presence of left artificial hip joint: Secondary | ICD-10-CM | POA: Diagnosis present

## 2018-05-16 DIAGNOSIS — Z96649 Presence of unspecified artificial hip joint: Secondary | ICD-10-CM

## 2018-05-16 DIAGNOSIS — Z882 Allergy status to sulfonamides status: Secondary | ICD-10-CM

## 2018-05-16 DIAGNOSIS — E669 Obesity, unspecified: Secondary | ICD-10-CM | POA: Diagnosis present

## 2018-05-16 DIAGNOSIS — M169 Osteoarthritis of hip, unspecified: Secondary | ICD-10-CM | POA: Diagnosis present

## 2018-05-16 DIAGNOSIS — Z881 Allergy status to other antibiotic agents status: Secondary | ICD-10-CM

## 2018-05-16 DIAGNOSIS — E1122 Type 2 diabetes mellitus with diabetic chronic kidney disease: Secondary | ICD-10-CM | POA: Diagnosis present

## 2018-05-16 HISTORY — PX: TOTAL HIP ARTHROPLASTY: SHX124

## 2018-05-16 LAB — GLUCOSE, CAPILLARY
GLUCOSE-CAPILLARY: 113 mg/dL — AB (ref 65–99)
Glucose-Capillary: 92 mg/dL (ref 65–99)
Glucose-Capillary: 100 mg/dL — ABNORMAL HIGH (ref 65–99)
Glucose-Capillary: 239 mg/dL — ABNORMAL HIGH (ref 65–99)

## 2018-05-16 LAB — TYPE AND SCREEN
ABO/RH(D): O POS
Antibody Screen: NEGATIVE

## 2018-05-16 SURGERY — ARTHROPLASTY, HIP, TOTAL, ANTERIOR APPROACH
Anesthesia: Spinal | Site: Hip | Laterality: Right

## 2018-05-16 MED ORDER — CEFAZOLIN SODIUM-DEXTROSE 2-4 GM/100ML-% IV SOLN
2.0000 g | INTRAVENOUS | Status: AC
  Administered 2018-05-16: 2 g via INTRAVENOUS
  Filled 2018-05-16: qty 100

## 2018-05-16 MED ORDER — TRAMADOL HCL 50 MG PO TABS
50.0000 mg | ORAL_TABLET | Freq: Four times a day (QID) | ORAL | Status: DC | PRN
  Administered 2018-05-16: 100 mg via ORAL
  Filled 2018-05-16 (×2): qty 2

## 2018-05-16 MED ORDER — 0.9 % SODIUM CHLORIDE (POUR BTL) OPTIME
TOPICAL | Status: DC | PRN
  Administered 2018-05-16: 1000 mL

## 2018-05-16 MED ORDER — PHENOL 1.4 % MT LIQD
1.0000 | OROMUCOSAL | Status: DC | PRN
  Filled 2018-05-16 (×2): qty 177

## 2018-05-16 MED ORDER — PROPOFOL 500 MG/50ML IV EMUL
INTRAVENOUS | Status: DC | PRN
  Administered 2018-05-16: 55 ug/kg/min via INTRAVENOUS

## 2018-05-16 MED ORDER — BISACODYL 10 MG RE SUPP: 10.0000 mg | Freq: Every day | RECTAL | Status: DC | PRN

## 2018-05-16 MED ORDER — ASPIRIN EC 325 MG PO TBEC
325.0000 mg | DELAYED_RELEASE_TABLET | Freq: Two times a day (BID) | ORAL | Status: DC
  Administered 2018-05-17 – 2018-05-18 (×3): 325 mg via ORAL
  Filled 2018-05-16 (×3): qty 1

## 2018-05-16 MED ORDER — SODIUM CHLORIDE 0.9 % IV SOLN
INTRAVENOUS | Status: DC
  Administered 2018-05-16 – 2018-05-17 (×2): via INTRAVENOUS

## 2018-05-16 MED ORDER — CHLORHEXIDINE GLUCONATE 4 % EX LIQD: 60.0000 mL | Freq: Once | CUTANEOUS | Status: DC

## 2018-05-16 MED ORDER — LACTATED RINGERS IV SOLN
INTRAVENOUS | Status: DC
  Administered 2018-05-16 (×3): via INTRAVENOUS

## 2018-05-16 MED ORDER — GLYCOPYRROLATE PF 0.2 MG/ML IJ SOSY
PREFILLED_SYRINGE | INTRAMUSCULAR | Status: DC | PRN
  Administered 2018-05-16: .2 mg via INTRAVENOUS

## 2018-05-16 MED ORDER — POLYETHYLENE GLYCOL 3350 17 G PO PACK
17.0000 g | PACK | Freq: Every day | ORAL | Status: DC | PRN
  Administered 2018-05-18: 17 g via ORAL
  Filled 2018-05-16: qty 1

## 2018-05-16 MED ORDER — ACETAMINOPHEN 325 MG PO TABS
650.0000 mg | ORAL_TABLET | Freq: Four times a day (QID) | ORAL | Status: DC | PRN
  Administered 2018-05-16: 650 mg via ORAL
  Filled 2018-05-16: qty 2

## 2018-05-16 MED ORDER — MECLIZINE HCL 25 MG PO TABS
12.5000 mg | ORAL_TABLET | Freq: Three times a day (TID) | ORAL | Status: DC | PRN
  Administered 2018-05-16 – 2018-05-18 (×4): 12.5 mg via ORAL
  Filled 2018-05-16 (×5): qty 1

## 2018-05-16 MED ORDER — TRANEXAMIC ACID 1000 MG/10ML IV SOLN
1000.0000 mg | Freq: Once | INTRAVENOUS | Status: AC
  Administered 2018-05-16: 1000 mg via INTRAVENOUS
  Filled 2018-05-16 (×2): qty 10

## 2018-05-16 MED ORDER — BUPIVACAINE-EPINEPHRINE (PF) 0.25% -1:200000 IJ SOLN
INTRAMUSCULAR | Status: DC | PRN
  Administered 2018-05-16: 30 mL

## 2018-05-16 MED ORDER — METOCLOPRAMIDE HCL 5 MG PO TABS: 5.0000 mg | ORAL_TABLET | Freq: Three times a day (TID) | ORAL | Status: DC | PRN

## 2018-05-16 MED ORDER — METHOCARBAMOL 1000 MG/10ML IJ SOLN
500.0000 mg | Freq: Four times a day (QID) | INTRAVENOUS | Status: DC | PRN
  Administered 2018-05-16 (×2): 500 mg via INTRAVENOUS
  Filled 2018-05-16 (×2): qty 550

## 2018-05-16 MED ORDER — BUPIVACAINE-EPINEPHRINE (PF) 0.25% -1:200000 IJ SOLN
INTRAMUSCULAR | Status: AC
  Filled 2018-05-16: qty 30

## 2018-05-16 MED ORDER — CEFAZOLIN SODIUM-DEXTROSE 2-4 GM/100ML-% IV SOLN
2.0000 g | Freq: Four times a day (QID) | INTRAVENOUS | Status: AC
  Administered 2018-05-16 – 2018-05-17 (×2): 2 g via INTRAVENOUS
  Filled 2018-05-16 (×2): qty 100

## 2018-05-16 MED ORDER — DEXAMETHASONE SODIUM PHOSPHATE 10 MG/ML IJ SOLN
10.0000 mg | Freq: Once | INTRAMUSCULAR | Status: AC
  Administered 2018-05-17: 10 mg via INTRAVENOUS
  Filled 2018-05-16: qty 1

## 2018-05-16 MED ORDER — ONDANSETRON HCL 4 MG PO TABS: 4.0000 mg | ORAL_TABLET | Freq: Four times a day (QID) | ORAL | Status: DC | PRN

## 2018-05-16 MED ORDER — MIDAZOLAM HCL 5 MG/5ML IJ SOLN
INTRAMUSCULAR | Status: DC | PRN
  Administered 2018-05-16 (×2): 1 mg via INTRAVENOUS

## 2018-05-16 MED ORDER — FENTANYL CITRATE (PF) 100 MCG/2ML IJ SOLN
INTRAMUSCULAR | Status: AC
  Filled 2018-05-16: qty 2

## 2018-05-16 MED ORDER — PHENYLEPHRINE 40 MCG/ML (10ML) SYRINGE FOR IV PUSH (FOR BLOOD PRESSURE SUPPORT)
PREFILLED_SYRINGE | INTRAVENOUS | Status: AC
  Filled 2018-05-16: qty 10

## 2018-05-16 MED ORDER — FLUTICASONE PROPIONATE 50 MCG/ACT NA SUSP
2.0000 | Freq: Every day | NASAL | Status: DC | PRN
  Administered 2018-05-16: 2 via NASAL
  Filled 2018-05-16: qty 16

## 2018-05-16 MED ORDER — HYDROCHLOROTHIAZIDE 25 MG PO TABS
12.5000 mg | ORAL_TABLET | Freq: Every day | ORAL | Status: DC
  Administered 2018-05-17 – 2018-05-18 (×2): 12.5 mg via ORAL
  Filled 2018-05-16 (×2): qty 1

## 2018-05-16 MED ORDER — BUPIVACAINE IN DEXTROSE 0.75-8.25 % IT SOLN
INTRATHECAL | Status: DC | PRN
  Administered 2018-05-16: 1.8 mL via INTRATHECAL

## 2018-05-16 MED ORDER — FENTANYL CITRATE (PF) 100 MCG/2ML IJ SOLN
INTRAMUSCULAR | Status: DC | PRN
  Administered 2018-05-16 (×2): 50 ug via INTRAVENOUS

## 2018-05-16 MED ORDER — PROPOFOL 10 MG/ML IV BOLUS
INTRAVENOUS | Status: AC
  Filled 2018-05-16: qty 40

## 2018-05-16 MED ORDER — EZETIMIBE 10 MG PO TABS
10.0000 mg | ORAL_TABLET | Freq: Every day | ORAL | Status: DC
  Administered 2018-05-17 – 2018-05-18 (×2): 10 mg via ORAL
  Filled 2018-05-16 (×2): qty 1

## 2018-05-16 MED ORDER — ONDANSETRON HCL 4 MG/2ML IJ SOLN: 4.0000 mg | Freq: Four times a day (QID) | INTRAMUSCULAR | Status: DC | PRN

## 2018-05-16 MED ORDER — DIPHENHYDRAMINE HCL 12.5 MG/5ML PO ELIX
12.5000 mg | ORAL_SOLUTION | ORAL | Status: DC | PRN
  Administered 2018-05-17: 25 mg via ORAL
  Filled 2018-05-16 (×2): qty 10

## 2018-05-16 MED ORDER — MIDAZOLAM HCL 2 MG/2ML IJ SOLN
INTRAMUSCULAR | Status: AC
  Filled 2018-05-16: qty 2

## 2018-05-16 MED ORDER — HYDROMORPHONE HCL 1 MG/ML IJ SOLN: 0.2500 mg | INTRAMUSCULAR | Status: DC | PRN

## 2018-05-16 MED ORDER — FLEET ENEMA 7-19 GM/118ML RE ENEM: 1.0000 | ENEMA | Freq: Once | RECTAL | Status: DC | PRN

## 2018-05-16 MED ORDER — INSULIN ASPART 100 UNIT/ML ~~LOC~~ SOLN
0.0000 [IU] | Freq: Three times a day (TID) | SUBCUTANEOUS | Status: DC
  Administered 2018-05-17: 8 [IU] via SUBCUTANEOUS
  Administered 2018-05-17: 5 [IU] via SUBCUTANEOUS
  Administered 2018-05-17: 15 [IU] via SUBCUTANEOUS
  Administered 2018-05-18: 5 [IU] via SUBCUTANEOUS

## 2018-05-16 MED ORDER — PROMETHAZINE HCL 25 MG/ML IJ SOLN: 6.2500 mg | INTRAMUSCULAR | Status: DC | PRN

## 2018-05-16 MED ORDER — PHENYLEPHRINE 40 MCG/ML (10ML) SYRINGE FOR IV PUSH (FOR BLOOD PRESSURE SUPPORT)
PREFILLED_SYRINGE | INTRAVENOUS | Status: DC | PRN
  Administered 2018-05-16 (×6): 80 ug via INTRAVENOUS

## 2018-05-16 MED ORDER — ACETAMINOPHEN 10 MG/ML IV SOLN
1000.0000 mg | Freq: Four times a day (QID) | INTRAVENOUS | Status: DC
  Administered 2018-05-16: 1000 mg via INTRAVENOUS
  Filled 2018-05-16: qty 100

## 2018-05-16 MED ORDER — ONDANSETRON HCL 4 MG/2ML IJ SOLN
INTRAMUSCULAR | Status: DC | PRN
  Administered 2018-05-16: 4 mg via INTRAVENOUS

## 2018-05-16 MED ORDER — STERILE WATER FOR IRRIGATION IR SOLN
Status: DC | PRN
  Administered 2018-05-16: 2000 mL

## 2018-05-16 MED ORDER — METHOCARBAMOL 500 MG PO TABS
500.0000 mg | ORAL_TABLET | Freq: Four times a day (QID) | ORAL | Status: DC | PRN
  Administered 2018-05-17 – 2018-05-18 (×3): 500 mg via ORAL
  Filled 2018-05-16 (×5): qty 1

## 2018-05-16 MED ORDER — METOCLOPRAMIDE HCL 5 MG/ML IJ SOLN: 5.0000 mg | Freq: Three times a day (TID) | INTRAMUSCULAR | Status: DC | PRN

## 2018-05-16 MED ORDER — DEXAMETHASONE SODIUM PHOSPHATE 10 MG/ML IJ SOLN
8.0000 mg | Freq: Once | INTRAMUSCULAR | Status: AC
  Administered 2018-05-16: 10 mg via INTRAVENOUS

## 2018-05-16 MED ORDER — DOCUSATE SODIUM 100 MG PO CAPS
100.0000 mg | ORAL_CAPSULE | Freq: Two times a day (BID) | ORAL | Status: DC
  Administered 2018-05-16 – 2018-05-18 (×4): 100 mg via ORAL
  Filled 2018-05-16 (×4): qty 1

## 2018-05-16 MED ORDER — MENTHOL 3 MG MT LOZG
1.0000 | LOZENGE | OROMUCOSAL | Status: DC | PRN
  Administered 2018-05-16: 3 mg via ORAL
  Filled 2018-05-16: qty 9

## 2018-05-16 MED ORDER — PHENYLEPHRINE HCL 10 MG/ML IJ SOLN
INTRAVENOUS | Status: DC | PRN
  Administered 2018-05-16: 25 ug/min via INTRAVENOUS

## 2018-05-16 MED ORDER — OXYCODONE HCL 5 MG PO TABS
5.0000 mg | ORAL_TABLET | ORAL | Status: DC | PRN
  Administered 2018-05-16 – 2018-05-17 (×3): 5 mg via ORAL
  Filled 2018-05-16 (×3): qty 1

## 2018-05-16 SURGICAL SUPPLY — 35 items
BLADE SAG 18X100X1.27 (BLADE) ×2 IMPLANT
CAPT HIP TOTAL 2 ×1 IMPLANT
CLOTH BEACON ORANGE TIMEOUT ST (SAFETY) ×2 IMPLANT
COVER PERINEAL POST (MISCELLANEOUS) ×2 IMPLANT
COVER SURGICAL LIGHT HANDLE (MISCELLANEOUS) ×2 IMPLANT
DECANTER SPIKE VIAL GLASS SM (MISCELLANEOUS) ×2 IMPLANT
DRAPE STERI IOBAN 125X83 (DRAPES) ×2 IMPLANT
DRAPE U-SHAPE 47X51 STRL (DRAPES) ×4 IMPLANT
DRSG ADAPTIC 3X8 NADH LF (GAUZE/BANDAGES/DRESSINGS) ×2 IMPLANT
DRSG MEPILEX BORDER 4X4 (GAUZE/BANDAGES/DRESSINGS) ×2 IMPLANT
DRSG MEPILEX BORDER 4X8 (GAUZE/BANDAGES/DRESSINGS) ×2 IMPLANT
DURAPREP 26ML APPLICATOR (WOUND CARE) ×2 IMPLANT
ELECT REM PT RETURN 15FT ADLT (MISCELLANEOUS) ×2 IMPLANT
EVACUATOR 1/8 PVC DRAIN (DRAIN) ×2 IMPLANT
GLOVE BIO SURGEON STRL SZ7 (GLOVE) ×2 IMPLANT
GLOVE BIO SURGEON STRL SZ8 (GLOVE) ×2 IMPLANT
GLOVE BIOGEL PI IND STRL 7.0 (GLOVE) ×3 IMPLANT
GLOVE BIOGEL PI IND STRL 7.5 (GLOVE) ×4 IMPLANT
GLOVE BIOGEL PI IND STRL 8 (GLOVE) ×1 IMPLANT
GLOVE BIOGEL PI INDICATOR 7.0 (GLOVE) ×3
GLOVE BIOGEL PI INDICATOR 7.5 (GLOVE) ×4
GLOVE BIOGEL PI INDICATOR 8 (GLOVE) ×1
GLOVE SURG SS PI 7.0 STRL IVOR (GLOVE) ×1 IMPLANT
GOWN STRL REUS W/TWL LRG LVL3 (GOWN DISPOSABLE) ×4 IMPLANT
GOWN STRL REUS W/TWL XL LVL3 (GOWN DISPOSABLE) ×2 IMPLANT
PACK ANTERIOR HIP CUSTOM (KITS) ×2 IMPLANT
STRIP CLOSURE SKIN 1/2X4 (GAUZE/BANDAGES/DRESSINGS) ×2 IMPLANT
SUT ETHIBOND NAB CT1 #1 30IN (SUTURE) ×2 IMPLANT
SUT MNCRL AB 4-0 PS2 18 (SUTURE) ×2 IMPLANT
SUT STRATAFIX 0 PDS 27 VIOLET (SUTURE) ×2
SUT VIC AB 2-0 CT1 27 (SUTURE) ×4
SUT VIC AB 2-0 CT1 TAPERPNT 27 (SUTURE) ×2 IMPLANT
SUTURE STRATFX 0 PDS 27 VIOLET (SUTURE) ×1 IMPLANT
TRAY FOLEY MTR SLVR 16FR STAT (SET/KITS/TRAYS/PACK) ×2 IMPLANT
YANKAUER SUCT BULB TIP 10FT TU (MISCELLANEOUS) ×2 IMPLANT

## 2018-05-16 NOTE — Op Note (Signed)
OPERATIVE REPORT- TOTAL HIP ARTHROPLASTY   PREOPERATIVE DIAGNOSIS: Osteoarthritis of the Right hip.   POSTOPERATIVE DIAGNOSIS: Osteoarthritis of the Right  hip.   PROCEDURE: Right total hip arthroplasty, anterior approach.   SURGEON: Gaynelle Arabian, MD   ASSISTANT: Theresa Duty, PA-C  ANESTHESIA:  Spinal  ESTIMATED BLOOD LOSS:-300 mL    DRAINS: Hemovac x1.   COMPLICATIONS: None   CONDITION: PACU - hemodynamically stable.   BRIEF CLINICAL NOTE: Phillip Neal is a 84 y.o. male who has advanced end-  stage arthritis of their Right  hip with progressively worsening pain and  dysfunction.The patient has failed nonoperative management and presents for  total hip arthroplasty.   PROCEDURE IN DETAIL: After successful administration of spinal  anesthetic, the traction boots for the [facility] bed were placed on both  feet and the patient was placed onto the [facility] bed, boots placed into the leg  holders. The Right hip was then isolated from the perineum with plastic  drapes and prepped and draped in the usual sterile fashion. ASIS and  greater trochanter were marked and a oblique incision was made, starting  at about 1 cm lateral and 2 cm distal to the ASIS and coursing towards  the anterior cortex of the femur. The skin was cut with a 10 blade  through subcutaneous tissue to the level of the fascia overlying the  tensor fascia lata muscle. The fascia was then incised in line with the  incision at the junction of the anterior third and posterior 2/3rd. The  muscle was teased off the fascia and then the interval between the TFL  and the rectus was developed. The Hohmann retractor was then placed at  the top of the femoral neck over the capsule. The vessels overlying the  capsule were cauterized and the fat on top of the capsule was removed.  A Hohmann retractor was then placed anterior underneath the rectus  femoris to give exposure to the entire anterior capsule. A T-shaped   capsulotomy was performed. The edges were tagged and the femoral head  was identified.       Osteophytes are removed off the superior acetabulum.  The femoral neck was then cut in situ with an oscillating saw. Traction  was then applied to the left lower extremity utilizing the [facility]  traction. The femoral head was then removed. Retractors were placed  around the acetabulum and then circumferential removal of the labrum was  performed. Osteophytes were also removed. Reaming starts at 51 mm to  medialize and  Increased in 2 mm increments to 55 mm. We reamed in  approximately 40 degrees of abduction, 20 degrees anteversion. A 56 mm  pinnacle acetabular shell was then impacted in anatomic position under  fluoroscopic guidance with excellent purchase. We did not need to place  any additional dome screws. A 36 mm neutral + 4 marathon liner was then  placed into the acetabular shell.       The femoral lift was then placed along the lateral aspect of the femur  just distal to the vastus ridge. The leg was  externally rotated and capsule  was stripped off the inferior aspect of the femoral neck down to the  level of the lesser trochanter, this was done with electrocautery. The femur was lifted after this was performed. The  leg was then placed in an extended and adducted position essentially delivering the femur. We also removed the capsule superiorly and the piriformis from the piriformis fossa  to gain excellent exposure of the  proximal femur. Rongeur was used to remove some cancellous bone to get  into the lateral portion of the proximal femur for placement of the  initial starter reamer. The starter broaches was placed  the starter broach  and was shown to go down the center of the canal. Broaching  with the  Corail system was then performed starting at size 8, coursing  Up to size 13. A size 13 had excellent torsional and rotational  and axial stability. The trial high offset neck was then  placed  with a 36 + 1.5 trial head. The hip was then reduced. We confirmed that  the stem was in the canal both on AP and lateral x-rays. It also has excellent sizing. The hip was reduced with outstanding stability through full extension and full external rotation.. AP pelvis was taken and the leg lengths were measured and found to be equal. Hip was then dislocated again and the femoral head and neck removed. The  femoral broach was removed. Size 13 Corail stem with a high offset  neck was then impacted into the femur following native anteversion. Has  excellent purchase in the canal. Excellent torsional and rotational and  axial stability. It is confirmed to be in the canal on AP and lateral  fluoroscopic views. The 36 + 1.5 ceramic head was placed and the hip  reduced with outstanding stability. Again AP pelvis was taken and it  confirmed that the leg lengths were equal. The wound was then copiously  irrigated with saline solution and the capsule reattached and repaired  with Ethibond suture. 30 ml of .25% Bupivicaine was  injected into the capsule and into the edge of the tensor fascia lata as well as subcutaneous tissue. The fascia overlying the tensor fascia lata was then closed with a running #1 V-Loc. Subcu was closed with interrupted 2-0 Vicryl and subcuticular running 4-0 Monocryl. Incision was cleaned  and dried. Steri-Strips and a bulky sterile dressing applied. Hemovac  drain was hooked to suction and then the patient was awakened and transported to  recovery in stable condition.        Please note that a surgical assistant was a medical necessity for this procedure to perform it in a safe and expeditious manner. Assistant was necessary to provide appropriate retraction of vital neurovascular structures and to prevent femoral fracture and allow for anatomic placement of the prosthesis.  Gaynelle Arabian, M.D.

## 2018-05-16 NOTE — Progress Notes (Signed)
Patient called and stated he went to urgent care last Friday concerning area where tick was removed near  potentialsurgery incision line and was told it was ok per urgent care and patient spoke with dr Forestine Chute primary md secretary  and followed up and was told dr Bea Graff had reviewed patient records concerning urgent care visit about red area near potentional incision line for surgery and patient is ok for surgery per dr Bea Graff.

## 2018-05-16 NOTE — Anesthesia Preprocedure Evaluation (Signed)
Anesthesia Evaluation  Patient identified by MRN, date of birth, ID band Patient awake    Reviewed: Allergy & Precautions, NPO status , Patient's Chart, lab work & pertinent test results  Airway Mallampati: II  TM Distance: >3 FB Neck ROM: Full    Dental no notable dental hx.    Pulmonary neg pulmonary ROS,    Pulmonary exam normal breath sounds clear to auscultation       Cardiovascular hypertension, Pt. on medications Normal cardiovascular exam Rhythm:Regular Rate:Normal  Left ventricle: The cavity size was normal. Wall thickness was   normal. Systolic function was low normal to mildly reduced. The   estimated ejection fraction was in the range of 50% to 55%. Wall   motion was normal; there were no regional wall motion   abnormalities. Doppler parameters are consistent with abnormal   left ventricular relaxation (grade 1 diastolic dysfunction). - Aortic valve: There was no stenosis. There was trivial   regurgitation. - Aorta: Mildly dilated ascending aorta. Ascending aortic diameter:   37 mm (S). - Mitral valve: Mildly calcified annulus. There was trivial   regurgitation. - Right ventricle: The cavity size was normal. Systolic function   was normal. - Pulmonary arteries: No complete TR doppler jet so unable to   estimate PA systolic pressure. - Inferior vena cava: The vessel was normal in size. The   respirophasic diameter changes were in the normal range (>= 50%),   consistent with normal central venous pressure.  Impressions:  - Normal LV size with low normal to mildly decreased systolic   function, EF 74-08%. Normal RV size and systolic function. No   significant valvular abnormalities.   Neuro/Psych negative neurological ROS  negative psych ROS   GI/Hepatic negative GI ROS, Neg liver ROS,   Endo/Other  diabetesobesity  Renal/GU Renal InsufficiencyRenal disease  negative genitourinary    Musculoskeletal negative musculoskeletal ROS (+)   Abdominal   Peds negative pediatric ROS (+)  Hematology negative hematology ROS (+)   Anesthesia Other Findings   Reproductive/Obstetrics negative OB ROS                             Anesthesia Physical Anesthesia Plan  ASA: III  Anesthesia Plan: Spinal   Post-op Pain Management:    Induction: Intravenous  PONV Risk Score and Plan: 2 and Ondansetron, Dexamethasone and Treatment may vary due to age or medical condition  Airway Management Planned: Simple Face Mask  Additional Equipment:   Intra-op Plan:   Post-operative Plan:   Informed Consent: I have reviewed the patients History and Physical, chart, labs and discussed the procedure including the risks, benefits and alternatives for the proposed anesthesia with the patient or authorized representative who has indicated his/her understanding and acceptance.   Dental advisory given  Plan Discussed with: CRNA and Surgeon  Anesthesia Plan Comments:         Anesthesia Quick Evaluation

## 2018-05-16 NOTE — Interval H&P Note (Signed)
History and Physical Interval Note:  05/16/2018 1:30 PM  Phillip Neal  has presented today for surgery, with the diagnosis of Osteoarthritis Right Hip  The various methods of treatment have been discussed with the patient and family. After consideration of risks, benefits and other options for treatment, the patient has consented to  Procedure(s): RIGHT TOTAL HIP ARTHROPLASTY ANTERIOR APPROACH (Right) as a surgical intervention .  [name] history has been reviewed, patient examined, no change in status, stable for surgery.  I have reviewed the patient's chart and labs.  Questions were answered to the patient's satisfaction.     Pilar Plate Shonita Rinck

## 2018-05-16 NOTE — Anesthesia Postprocedure Evaluation (Signed)
Anesthesia Post Note  Patient: Phillip Neal  Procedure(s) Performed: RIGHT TOTAL HIP ARTHROPLASTY ANTERIOR APPROACH (Right Hip)     Patient location during evaluation: PACU Anesthesia Type: Spinal Level of consciousness: oriented and awake and alert Pain management: pain level controlled Vital Signs Assessment: post-procedure vital signs reviewed and stable Respiratory status: spontaneous breathing, respiratory function stable and patient connected to nasal cannula oxygen Cardiovascular status: blood pressure returned to baseline and stable Postop Assessment: no headache, no backache and no apparent nausea or vomiting Anesthetic complications: no    Last Vitals:  Vitals:   05/16/18 1756 05/16/18 1847  BP: 114/64 133/70  Pulse: 63 71  Resp: 14 16  Temp: 36.4 C 36.8 C  SpO2: 99% 100%    Last Pain:  Vitals:   05/16/18 1847  TempSrc: Oral  PainSc:                  Dilara Navarrete S

## 2018-05-16 NOTE — Discharge Instructions (Signed)
°Dr. Frank Aluisio °Total Joint Specialist °[facility] °[address] °[place],  [address] °[phone] ° °ANTERIOR APPROACH TOTAL HIP REPLACEMENT POSTOPERATIVE DIRECTIONS ° ° °Hip Rehabilitation, Guidelines Following Surgery  °The results of a hip operation are greatly improved after range of motion and muscle strengthening exercises. Follow all safety measures which are given to protect your hip. If any of these exercises cause increased pain or swelling in your joint, decrease the amount until you are comfortable again. Then slowly increase the exercises. Call your caregiver if you have problems or questions.  ° °HOME CARE INSTRUCTIONS  °• Remove items at home which could result in a fall. This includes throw rugs or furniture in walking pathways.  °· ICE to the affected hip every three hours for 30 minutes at a time and then as needed for pain and swelling.  Continue to use ice on the hip for pain and swelling from surgery. You may notice swelling that will progress down to the foot and ankle.  This is normal after surgery.  Elevate the leg when you are not up walking on it.   °· Continue to use the breathing machine which will help keep your temperature down.  It is common for your temperature to cycle up and down following surgery, especially at night when you are not up moving around and exerting yourself.  The breathing machine keeps your lungs expanded and your temperature down. ° °DIET °You may resume your previous home diet once your are discharged from the hospital. ° °DRESSING / WOUND CARE / SHOWERING °You may shower 3 days after surgery, but keep the wounds dry during showering.  You may use an occlusive plastic wrap (Press'n Seal for example), NO SOAKING/SUBMERGING IN THE BATHTUB.  If the bandage gets wet, change with a clean dry gauze.  If the incision gets wet, pat the wound dry with a clean towel. °You may start showering once you are discharged home but do not submerge the  incision under water. Just pat the incision dry and apply a dry gauze dressing on daily. °Change the surgical dressing daily and reapply a dry dressing each time. ° °ACTIVITY °Walk with your walker as instructed. °Use walker as long as suggested by your caregivers. °Avoid periods of inactivity such as sitting longer than an hour when not asleep. This helps prevent blood clots.  °You may resume a sexual relationship in one month or when given the OK by your doctor.  °You may return to work once you are cleared by your doctor.  °Do not drive a car for 6 weeks or until released by you surgeon.  °Do not drive while taking narcotics. ° °WEIGHT BEARING °Weight bearing as tolerated with assist device (walker, cane, etc) as directed, use it as long as suggested by your surgeon or therapist, typically at least 4-6 weeks. ° °POSTOPERATIVE CONSTIPATION PROTOCOL °Constipation - defined medically as fewer than three stools per week and severe constipation as less than one stool per week. ° °One of the most common issues patients have following surgery is constipation.  Even if you have a regular bowel pattern at home, your normal regimen is likely to be disrupted due to multiple reasons following surgery.  Combination of anesthesia, postoperative narcotics, change in appetite and fluid intake all can affect your bowels.  In order to avoid complications following surgery, here are some recommendations in order to help you during your recovery period. ° °Colace (docusate) - Pick up an over-the-counter form   of Colace or another stool softener and take twice a day as long as you are requiring postoperative pain medications.  Take with a full glass of water daily.  If you experience loose stools or diarrhea, hold the colace until you stool forms back up.  If your symptoms do not get better within 1 week or if they get worse, check with your doctor. ° °Dulcolax (bisacodyl) - Pick up over-the-counter and take as directed by the product  packaging as needed to assist with the movement of your bowels.  Take with a full glass of water.  Use this product as needed if not relieved by Colace only.  ° °MiraLax (polyethylene glycol) - Pick up over-the-counter to have on hand.  MiraLax is a solution that will increase the amount of water in your bowels to assist with bowel movements.  Take as directed and can mix with a glass of water, juice, soda, coffee, or tea.  Take if you go more than two days without a movement. °Do not use MiraLax more than once per day. Call your doctor if you are still constipated or irregular after using this medication for 7 days in a row. ° °If you continue to have problems with postoperative constipation, please contact the office for further assistance and recommendations.  If you experience "the worst abdominal pain ever" or develop nausea or vomiting, please contact the office immediatly for further recommendations for treatment. ° °ITCHING ° If you experience itching with your medications, try taking only a single pain pill, or even half a pain pill at a time.  You can also use Benadryl over the counter for itching or also to help with sleep.  ° °TED HOSE STOCKINGS °Wear the elastic stockings on both legs for three weeks following surgery during the day but you may remove then at night for sleeping. ° °MEDICATIONS °See your medication summary on the “After Visit Summary” that the nursing staff will review with you prior to discharge.  You may have some home medications which will be placed on hold until you complete the course of blood thinner medication.  It is important for you to complete the blood thinner medication as prescribed by your surgeon.  Continue your approved medications as instructed at time of discharge. ° °PRECAUTIONS °If you experience chest pain or shortness of breath - call 911 immediately for transfer to the hospital emergency department.  °If you develop a fever greater that 101 F, purulent drainage  from wound, increased redness or drainage from wound, foul odor from the wound/dressing, or calf pain - CONTACT YOUR SURGEON.   °                                                °FOLLOW-UP APPOINTMENTS °Make sure you keep all of your appointments after your operation with your surgeon and caregivers. You should call the office at the above phone number and make an appointment for approximately two weeks after the date of your surgery or on the date instructed by your surgeon outlined in the "After Visit Summary". ° °RANGE OF MOTION AND STRENGTHENING EXERCISES  °These exercises are designed to help you keep full movement of your hip joint. Follow your caregiver's or physical therapist's instructions. Perform all exercises about fifteen times, three times per day or as directed. Exercise both hips, even if you have   had only one joint replacement. These exercises can be done on a training (exercise) mat, on the floor, on a table or on a bed. Use whatever works the best and is most comfortable for you. Use music or television while you are exercising so that the exercises are a pleasant break in your day. This will make your life better with the exercises acting as a break in routine you can look forward to.  °• Lying on your back, slowly slide your foot toward your buttocks, raising your knee up off the floor. Then slowly slide your foot back down until your leg is straight again.  °• Lying on your back spread your legs as far apart as you can without causing discomfort.  °• Lying on your side, raise your upper leg and foot straight up from the floor as far as is comfortable. Slowly lower the leg and repeat.  °• Lying on your back, tighten up the muscle in the front of your thigh (quadriceps muscles). You can do this by keeping your leg straight and trying to raise your heel off the floor. This helps strengthen the largest muscle supporting your knee.  °• Lying on your back, tighten up the muscles of your buttocks both  with the legs straight and with the knee bent at a comfortable angle while keeping your heel on the floor.  ° °IF YOU ARE TRANSFERRED TO A SKILLED REHAB FACILITY °If the patient is transferred to a skilled rehab facility following release from the hospital, a list of the current medications will be sent to the facility for the patient to continue.  When discharged from the skilled rehab facility, please have the facility set up the patient's Home Health Physical Therapy prior to being released. Also, the skilled facility will be responsible for providing the patient with their medications at time of release from the facility to include their pain medication, the muscle relaxants, and their blood thinner medication. If the patient is still at the rehab facility at time of the two week follow up appointment, the skilled rehab facility will also need to assist the patient in arranging follow up appointment in our office and any transportation needs. ° °MAKE SURE YOU:  °• Understand these instructions.  °• Get help right away if you are not doing well or get worse.  ° ° °Pick up stool softner and laxative for home use following surgery while on pain medications. °Do not submerge incision under water. °Please use good hand washing techniques while changing dressing each day. °May shower starting three days after surgery. °Please use a clean towel to pat the incision dry following showers. °Continue to use ice for pain and swelling after surgery. °Do not use any lotions or creams on the incision until instructed by your surgeon. ° °

## 2018-05-16 NOTE — Progress Notes (Signed)
Belongings bag given to Retail banker at [employer].

## 2018-05-16 NOTE — Anesthesia Procedure Notes (Signed)
Spinal  Patient location during procedure: OR Start time: 05/16/2018 1:58 PM End time: 05/16/2018 2:03 PM Staffing Anesthesiologist: Myrtie Soman, MD Performed: anesthesiologist  Preanesthetic Checklist Completed: patient identified, site marked, surgical consent, pre-op evaluation, timeout performed, IV checked, risks and benefits discussed and monitors and equipment checked Spinal Block Patient position: sitting Prep: Betadine Patient monitoring: heart rate, continuous pulse ox and blood pressure Location: L3-4 Injection technique: single-shot Needle Needle type: Pencan  Needle gauge: 24 G Needle length: 9 cm Additional Notes Expiration date of kit checked and confirmed. Patient tolerated procedure well, without complications.

## 2018-05-16 NOTE — Transfer of Care (Signed)
Immediate Anesthesia Transfer of Care Note  Patient: Phillip Neal  Procedure(s) Performed: Procedure(s): RIGHT TOTAL HIP ARTHROPLASTY ANTERIOR APPROACH (Right)  Patient Location: PACU  Anesthesia Type:Spinal  Level of Consciousness:  sedated, patient cooperative and responds to stimulation  Airway & Oxygen Therapy:Patient Spontanous Breathing and Patient connected to face mask oxgen  Post-op Assessment:  Report given to PACU RN and Post -op Vital signs reviewed and stable  Post vital signs:  Reviewed and stable  Last Vitals:  Vitals:   05/16/18 1312  BP: 119/73  Pulse: 70  Resp: 18  Temp: 37.1 C  SpO2: 703%    Complications: No apparent anesthesia complications

## 2018-05-17 ENCOUNTER — Encounter (HOSPITAL_COMMUNITY): Payer: Self-pay | Admitting: Orthopedic Surgery

## 2018-05-17 LAB — CBC
HCT: 38.4 % — ABNORMAL LOW (ref 39.0–52.0)
Hemoglobin: 12.7 g/dL — ABNORMAL LOW (ref 13.0–17.0)
MCH: 32.7 pg (ref 26.0–34.0)
MCHC: 33.1 g/dL (ref 30.0–36.0)
MCV: 99 fL (ref 78.0–100.0)
PLATELETS: 215 10*3/uL (ref 150–400)
RBC: 3.88 MIL/uL — ABNORMAL LOW (ref 4.22–5.81)
RDW: 12.9 % (ref 11.5–15.5)
WBC: 13 10*3/uL — AB (ref 4.0–10.5)

## 2018-05-17 LAB — BASIC METABOLIC PANEL
ANION GAP: 7 (ref 5–15)
BUN: 29 mg/dL — ABNORMAL HIGH (ref 6–20)
CO2: 23 mmol/L (ref 22–32)
CREATININE: 1.43 mg/dL — AB (ref 0.61–1.24)
Calcium: 8.5 mg/dL — ABNORMAL LOW (ref 8.9–10.3)
Chloride: 106 mmol/L (ref 101–111)
GFR, EST AFRICAN AMERICAN: 50 mL/min — AB (ref >60)
GFR, EST NON AFRICAN AMERICAN: 43 mL/min — AB (ref >60)
Glucose, Bld: 286 mg/dL — ABNORMAL HIGH (ref 65–99)
Potassium: 4.4 mmol/L (ref 3.5–5.1)
SODIUM: 136 mmol/L (ref 135–145)

## 2018-05-17 LAB — GLUCOSE, CAPILLARY
GLUCOSE-CAPILLARY: 284 mg/dL — AB (ref 65–99)
GLUCOSE-CAPILLARY: 203 mg/dL — AB (ref 65–99)
Glucose-Capillary: 239 mg/dL — ABNORMAL HIGH (ref 65–99)

## 2018-05-17 MED ORDER — OXYCODONE HCL 5 MG PO TABS
5.0000 mg | ORAL_TABLET | ORAL | Status: DC | PRN
  Administered 2018-05-17: 10 mg via ORAL
  Administered 2018-05-17: 5 mg via ORAL
  Administered 2018-05-17 – 2018-05-18 (×3): 10 mg via ORAL
  Filled 2018-05-17 (×3): qty 2
  Filled 2018-05-17: qty 1
  Filled 2018-05-17: qty 2

## 2018-05-17 MED ORDER — ALUM & MAG HYDROXIDE-SIMETH 200-200-20 MG/5ML PO SUSP
15.0000 mL | Freq: Once | ORAL | Status: AC
  Administered 2018-05-17: 15 mL via ORAL
  Filled 2018-05-17: qty 30

## 2018-05-17 MED ORDER — TRAMADOL HCL 50 MG PO TABS: 50.0000 mg | ORAL_TABLET | Freq: Four times a day (QID) | ORAL | Status: DC | PRN

## 2018-05-17 NOTE — Progress Notes (Signed)
Physical Therapy Treatment Patient Details Name: Phillip Neal MRN: [id number] DOB: January 14, 1931 Today's Date: 05/17/2018    History of Present Illness Pt s/p R THR and with hx of recent L THR, DM  and vertigo    PT Comments    Pt continues very cooperative but continues to c/o increased pain with this THR vs L THR in March 2019.   Follow Up Recommendations  Follow surgeon's recommendation for DC plan and follow-up therapies     Equipment Recommendations  None recommended by PT    Recommendations for Other Services       Precautions / Restrictions Precautions Precautions: Fall Restrictions Weight Bearing Restrictions: No Other Position/Activity Restrictions: WBAT    Mobility  Bed Mobility Overal bed mobility: Needs Assistance Bed Mobility: Sit to Supine       Sit to supine: Min assist   General bed mobility comments: cues for sequence and use of L LE to self assist  Transfers Overall transfer level: Needs assistance Equipment used: Rolling walker (2 wheeled) Transfers: Sit to/from Stand Sit to Stand: Min guard         General transfer comment: cues for LE management and use of UEs to self assist  Ambulation/Gait Ambulation/Gait assistance: Min assist;Min guard Gait Distance (Feet): 200 Feet Assistive device: Rolling walker (2 wheeled) Gait Pattern/deviations: Step-to pattern;Step-through pattern;Decreased step length - right;Decreased step length - left;Shuffle;Trunk flexed Gait velocity: decr   General Gait Details: cues for posture, position from RW and initial sequence   Stairs             Wheelchair Mobility    Modified Rankin (Stroke Patients Only)       Balance                                            Cognition Arousal/Alertness: Awake/alert Behavior During Therapy: WFL for tasks assessed/performed Overall Cognitive Status: Within Functional Limits for tasks assessed                                         Exercises      General Comments        Pertinent Vitals/Pain Pain Assessment: 0-10 Pain Score: 6  Pain Location: R hip Pain Descriptors / Indicators: Aching;Sore Pain Intervention(s): Limited activity within patient's tolerance;Monitored during session;Premedicated before session;Ice applied    Home Living                      Prior Function            PT Goals (current goals can now be found in the care plan section) Acute Rehab PT Goals Patient Stated Goal: Regain IND PT Goal Formulation: With patient Time For Goal Achievement: 05/24/18 Potential to Achieve Goals: Good Progress towards PT goals: Progressing toward goals    Frequency    7X/week      PT Plan Current plan remains appropriate    Co-evaluation              AM-PAC PT "6 Clicks" Daily Activity  Outcome Measure  Difficulty turning over in bed (including adjusting bedclothes, sheets and blankets)?: Unable Difficulty moving from lying on back to sitting on the side of the bed? : Unable Difficulty sitting down on and standing up from  a chair with arms (e.g., wheelchair, bedside commode, etc,.)?: A Lot Help needed moving to and from a bed to chair (including a wheelchair)?: A Little Help needed walking in hospital room?: A Little Help needed climbing 3-5 steps with a railing? : A Little 6 Click Score: 13    End of Session Equipment Utilized During Treatment: Gait belt Activity Tolerance: Patient tolerated treatment well;Patient limited by pain Patient left: in bed;with nursing/sitter in room;with family/visitor present Nurse Communication: Mobility status PT Visit Diagnosis: Difficulty in walking, not elsewhere classified (R26.2)     Time: 7737-5051 PT Time Calculation (min) (ACUTE ONLY): 20 min  Charges:  $Gait Training: 8-22 mins                    G Codes:       WB [phone]    [name] 05/17/2018, 5:56 PM

## 2018-05-17 NOTE — Progress Notes (Cosign Needed)
   Subjective: 1 Day Post-Op Procedure(s) (LRB): RIGHT TOTAL HIP ARTHROPLASTY ANTERIOR APPROACH (Right) Patient reports pain as mild.   Patient seen in rounds by Dr. Wynelle Link. Patient is well, and has had no acute complaints or problems other than pain in the right hip. Denies chest pain or SOB.  We will start therapy today.   Objective: Vital signs in last 24 hours: Temp:  [97.6 F (36.4 C)-98.7 F (37.1 C)] 98.3 F (36.8 C) (06/20 0558) Pulse Rate:  [63-71] 69 (06/20 0558) Resp:  [12-20] 16 (06/20 0558) BP: (91-146)/(54-88) 136/81 (06/20 0558) SpO2:  [95 %-100 %] 95 % (06/20 0558) Weight:  [101.2 kg (223 lb)] 101.2 kg (223 lb) (06/19 1328)  Intake/Output from previous day:  Intake/Output Summary (Last 24 hours) at 05/17/2018 0807 Last data filed at 05/17/2018 0600 Gross per 24 hour  Intake 3874.16 ml  Output 2035 ml  Net 1839.16 ml    Labs: Recent Labs    05/17/18 0523  HGB 12.7*   Recent Labs    05/17/18 0523  WBC 13.0*  RBC 3.88*  HCT 38.4*  PLT 215   Recent Labs    05/17/18 0523  NA 136  K 4.4  CL 106  CO2 23  BUN 29*  CREATININE 1.43*  GLUCOSE 286*  CALCIUM 8.5*   Exam: General - Patient is Alert and Oriented Extremity - Neurologically intact Neurovascular intact Sensation intact distally Dorsiflexion/Plantar flexion intact Dressing - dressing C/D/I Motor Function - intact, moving foot and toes well on exam.   Past Medical History:  Diagnosis Date  . Arthritis    oa bo hips  . Cancer ([place])    squamous cell removed 3 yrs ago from upper chest by neck  . Cough last week   no phlegm jill dr Wynelle Link pa aware  . Diabetes mellitus without complication ([place])   . History of kidney stones    years ago, passed on own  . Hyperlipidemia   . Hypertension   . Vertigo    jan  2019 surgery rescheduled ent dr Laurance Flatten [place] no vertigo since end of january    Assessment/Plan: 1 Day Post-Op Procedure(s) (LRB): RIGHT TOTAL HIP ARTHROPLASTY ANTERIOR  APPROACH (Right) Active Problems:   OA (osteoarthritis) of hip  Estimated body mass index is 34.93 kg/m as calculated from the following:   Height as of this encounter: 5\' 7"  (1.702 m).   Weight as of this encounter: 101.2 kg (223 lb). Advance diet Up with therapy  DVT Prophylaxis - Aspirin Weight bearing as tolerated. D/C O2 and pulse ox and try on room air. Hemovac pulled without difficulty, will begin therapy.  Plan is to go Home after hospital stay. Plan for discharge tomorrow.  Theresa Duty, PA-C Orthopedic Surgery 05/17/2018, 8:07 AM

## 2018-05-17 NOTE — Evaluation (Signed)
Physical Therapy Evaluation Patient Details Name: Phillip Neal MRN: [id number] DOB: 04-23-1931 Today's Date: 05/17/2018   History of Present Illness  Pt s/p R THR and with hx of recent L THR, DM  and vertigo  Clinical Impression  Pt s/p R THR and presents with functional mobility limitations 2* decreased strength/ROM R LE and post op pain.  Pt should progress to dc home with family assist.    Follow Up Recommendations Follow surgeon's recommendation for DC plan and follow-up therapies    Equipment Recommendations  None recommended by PT    Recommendations for Other Services       Precautions / Restrictions Precautions Precautions: Fall Restrictions Weight Bearing Restrictions: No Other Position/Activity Restrictions: WBAT      Mobility  Bed Mobility Overal bed mobility: Needs Assistance Bed Mobility: Supine to Sit     Supine to sit: Min assist     General bed mobility comments: cues for sequence and use of L LE to self assist  Transfers Overall transfer level: Needs assistance Equipment used: Rolling walker (2 wheeled) Transfers: Sit to/from Stand Sit to Stand: Min assist         General transfer comment: cues for LE management and use of UEs to self assist  Ambulation/Gait Ambulation/Gait assistance: Min assist Gait Distance (Feet): 85 Feet Assistive device: Rolling walker (2 wheeled) Gait Pattern/deviations: Step-to pattern;Step-through pattern;Decreased step length - right;Decreased step length - left;Shuffle;Trunk flexed Gait velocity: decr   General Gait Details: cues for posture, position from RW and initial sequence  Stairs            Wheelchair Mobility    Modified Rankin (Stroke Patients Only)       Balance                                             Pertinent Vitals/Pain Pain Assessment: 0-10 Pain Score: 7  Pain Location: R hip Pain Descriptors / Indicators: Aching;Sore Pain Intervention(s): Limited  activity within patient's tolerance;Monitored during session;Premedicated before session;Ice applied    Home Living Family/patient expects to be discharged to:: Private residence Living Arrangements: Spouse/significant other Available Help at Discharge: Family;Available PRN/intermittently Type of Home: House Home Access: Stairs to enter Entrance Stairs-Rails: Right;Left;Can reach both Entrance Stairs-Number of Steps: 6 Home Layout: One level Home Equipment: Walker - 2 wheels;Shower seat;Bedside commode      Prior Function Level of Independence: Independent         Comments: gardens and does yard work     Journalist, newspaper   Dominant Hand: Right    Extremity/Trunk Assessment   Upper Extremity Assessment Upper Extremity Assessment: Overall WFL for tasks assessed    Lower Extremity Assessment Lower Extremity Assessment: RLE deficits/detail RLE Deficits / Details: 2+/5 strength at hip with AAROM at hip to 80 flex and 15 abd       Communication   Communication: HOH  Cognition Arousal/Alertness: Awake/alert Behavior During Therapy: WFL for tasks assessed/performed Overall Cognitive Status: Within Functional Limits for tasks assessed                                        General Comments      Exercises Total Joint Exercises Ankle Circles/Pumps: AROM;Both;15 reps;Supine Quad Sets: AROM;Both;10 reps;Supine Heel Slides: AAROM;Right;20 reps;Supine Hip ABduction/ADduction: AAROM;Right;15  reps;Supine   Assessment/Plan    PT Assessment Patient needs continued PT services  PT Problem List Decreased strength;Decreased range of motion;Decreased activity tolerance;Decreased mobility;Pain;Decreased knowledge of use of DME;Obesity       PT Treatment Interventions DME instruction;Gait training;Functional mobility training;Stair training;Therapeutic activities;Therapeutic exercise;Patient/family education    PT Goals (Current goals can be found in the Care  Plan section)  Acute Rehab PT Goals Patient Stated Goal: Regain IND PT Goal Formulation: With patient Time For Goal Achievement: 05/24/18 Potential to Achieve Goals: Good    Frequency 7X/week   Barriers to discharge        Co-evaluation               AM-PAC PT "6 Clicks" Daily Activity  Outcome Measure Difficulty turning over in bed (including adjusting bedclothes, sheets and blankets)?: Unable Difficulty moving from lying on back to sitting on the side of the bed? : Unable Difficulty sitting down on and standing up from a chair with arms (e.g., wheelchair, bedside commode, etc,.)?: Unable Help needed moving to and from a bed to chair (including a wheelchair)?: A Little Help needed walking in hospital room?: A Little Help needed climbing 3-5 steps with a railing? : A Little 6 Click Score: 12    End of Session Equipment Utilized During Treatment: Gait belt Activity Tolerance: Patient tolerated treatment well;Patient limited by pain Patient left: in chair Nurse Communication: Mobility status PT Visit Diagnosis: Difficulty in walking, not elsewhere classified (R26.2)    Time: 8242-3536 PT Time Calculation (min) (ACUTE ONLY): 43 min   Charges:   PT Evaluation $PT Eval Low Complexity: 1 Low PT Treatments $Gait Training: 8-22 mins $Therapeutic Exercise: 8-22 mins   PT G Codes:        Pg [phone]   Gerrick Ray 05/17/2018, 1:11 PM

## 2018-05-17 NOTE — Progress Notes (Signed)
Patient accidentally pulled out hemovac. Abd pad applied to site. Complaints of pain of 7 overnight. Paged PA and obtained order for Oxycodone 5 mg PRN and Tylenol PRN at 21:16. Vitals stable.

## 2018-05-18 LAB — BASIC METABOLIC PANEL
Anion gap: 6 (ref 5–15)
BUN: 31 mg/dL — ABNORMAL HIGH (ref 6–20)
CHLORIDE: 107 mmol/L (ref 101–111)
CO2: 23 mmol/L (ref 22–32)
CREATININE: 1.32 mg/dL — AB (ref 0.61–1.24)
Calcium: 8.4 mg/dL — ABNORMAL LOW (ref 8.9–10.3)
GFR calc Af Amer: 55 mL/min — ABNORMAL LOW (ref >60)
GFR, EST NON AFRICAN AMERICAN: 47 mL/min — AB (ref >60)
GLUCOSE: 257 mg/dL — AB (ref 65–99)
POTASSIUM: 4.8 mmol/L (ref 3.5–5.1)
Sodium: 136 mmol/L (ref 135–145)

## 2018-05-18 LAB — CBC
HCT: 35.6 % — ABNORMAL LOW (ref 39.0–52.0)
Hemoglobin: 11.6 g/dL — ABNORMAL LOW (ref 13.0–17.0)
MCH: 32.4 pg (ref 26.0–34.0)
MCHC: 32.6 g/dL (ref 30.0–36.0)
MCV: 99.4 fL (ref 78.0–100.0)
PLATELETS: 194 10*3/uL (ref 150–400)
RBC: 3.58 MIL/uL — AB (ref 4.22–5.81)
RDW: 12.8 % (ref 11.5–15.5)
WBC: 14.6 10*3/uL — ABNORMAL HIGH (ref 4.0–10.5)

## 2018-05-18 LAB — GLUCOSE, CAPILLARY
Glucose-Capillary: 243 mg/dL — ABNORMAL HIGH (ref 65–99)
Glucose-Capillary: 204 mg/dL — ABNORMAL HIGH (ref 65–99)

## 2018-05-18 MED ORDER — OXYCODONE HCL 5 MG PO TABS: 5.0000 mg | ORAL_TABLET | ORAL | 0 refills | Status: DC | PRN

## 2018-05-18 MED ORDER — ASPIRIN 325 MG PO TBEC: 325.0000 mg | DELAYED_RELEASE_TABLET | Freq: Two times a day (BID) | ORAL | 0 refills | Status: AC

## 2018-05-18 MED ORDER — TRAMADOL HCL 50 MG PO TABS: 50.0000 mg | ORAL_TABLET | Freq: Four times a day (QID) | ORAL | 0 refills | Status: DC | PRN

## 2018-05-18 MED ORDER — METHOCARBAMOL 500 MG PO TABS: 500.0000 mg | ORAL_TABLET | Freq: Four times a day (QID) | ORAL | 0 refills | Status: DC | PRN

## 2018-05-18 NOTE — Progress Notes (Signed)
Physical Therapy Treatment Patient Details Name: Phillip Neal MRN: [id number] DOB: 1931/05/22 Today's Date: 05/18/2018    History of Present Illness Pt s/p R THR and with hx of recent L THR, DM  and vertigo    PT Comments    Pt performed and reviewed home therex program including progression.  Dtr present   Follow Up Recommendations  Follow surgeon's recommendation for DC plan and follow-up therapies     Equipment Recommendations  None recommended by PT    Recommendations for Other Services       Precautions / Restrictions Precautions Precautions: Fall Restrictions Weight Bearing Restrictions: No Other Position/Activity Restrictions: WBAT    Mobility  Bed Mobility Overal bed mobility: Needs Assistance Bed Mobility: Supine to Sit;Sit to Supine     Supine to sit: Min guard Sit to supine: Min guard   General bed mobility comments: cues for sequence and use of L LE to self assist  Transfers Overall transfer level: Needs assistance Equipment used: Rolling walker (2 wheeled) Transfers: Sit to/from Stand Sit to Stand: Min guard;Supervision         General transfer comment: cues for LE management and use of UEs to self assist  Ambulation/Gait Ambulation/Gait assistance: Min guard;Supervision Gait Distance (Feet): 150 Feet Assistive device: Rolling walker (2 wheeled) Gait Pattern/deviations: Step-to pattern;Step-through pattern;Decreased step length - right;Decreased step length - left;Shuffle;Trunk flexed Gait velocity: decr   General Gait Details: cues for posture, position from RW and initial sequence   Stairs Stairs: Yes Stairs assistance: Min assist Stair Management: Two rails;Step to pattern;Forwards Number of Stairs: 5 General stair comments: min cues for sequence and foot placement.  Dtr present   Wheelchair Mobility    Modified Rankin (Stroke Patients Only)       Balance                                             Cognition Arousal/Alertness: Awake/alert Behavior During Therapy: WFL for tasks assessed/performed Overall Cognitive Status: Within Functional Limits for tasks assessed                                        Exercises Total Joint Exercises Ankle Circles/Pumps: AROM;Both;15 reps;Supine Quad Sets: AROM;Both;10 reps;Supine Heel Slides: AAROM;Right;20 reps;Supine Hip ABduction/ADduction: AAROM;Right;15 reps;Supine Long Arc Quad: Right;10 reps;Seated;AROM    General Comments        Pertinent Vitals/Pain Pain Assessment: 0-10 Pain Score: 6  Pain Location: R hip Pain Descriptors / Indicators: Aching;Sore Pain Intervention(s): Limited activity within patient's tolerance;Monitored during session;Premedicated before session;Ice applied    Home Living                      Prior Function            PT Goals (current goals can now be found in the care plan section) Acute Rehab PT Goals Patient Stated Goal: Regain IND PT Goal Formulation: With patient Time For Goal Achievement: 05/24/18 Potential to Achieve Goals: Good Progress towards PT goals: Progressing toward goals    Frequency    7X/week      PT Plan Current plan remains appropriate    Co-evaluation              AM-PAC PT "6 Clicks" Daily Activity  Outcome  Measure  Difficulty turning over in bed (including adjusting bedclothes, sheets and blankets)?: A Lot Difficulty moving from lying on back to sitting on the side of the bed? : A Lot Difficulty sitting down on and standing up from a chair with arms (e.g., wheelchair, bedside commode, etc,.)?: A Lot Help needed moving to and from a bed to chair (including a wheelchair)?: A Little Help needed walking in hospital room?: A Little Help needed climbing 3-5 steps with a railing? : A Little 6 Click Score: 15    End of Session Equipment Utilized During Treatment: Gait belt Activity Tolerance: Patient tolerated treatment well Patient  left: in bed;with nursing/sitter in room;with family/visitor present Nurse Communication: Mobility status PT Visit Diagnosis: Difficulty in walking, not elsewhere classified (R26.2)     Time: 8682-5749 PT Time Calculation (min) (ACUTE ONLY): 21 min  Charges:  $Gait Training: 8-22 mins $Therapeutic Exercise: 8-22 mins $Therapeutic Activity: 8-22 mins                    G Codes:       Pg [phone]    Jakie Debow 05/18/2018, 12:43 PM

## 2018-05-18 NOTE — Evaluation (Signed)
Physical Therapy Evaluation Patient Details Name: Phillip Neal MRN: [id number] DOB: 02-15-1931 Today's Date: 05/18/2018   History of Present Illness  Pt s/p R THR and with hx of recent L THR, DM  and vertigo  Clinical Impression  Pt progressing steadily.  Reviewed stairs and lower body dressing strategies.    Follow Up Recommendations Follow surgeon's recommendation for DC plan and follow-up therapies    Equipment Recommendations  None recommended by PT    Recommendations for Other Services       Precautions / Restrictions Precautions Precautions: Fall Restrictions Weight Bearing Restrictions: No Other Position/Activity Restrictions: WBAT      Mobility  Bed Mobility Overal bed mobility: Needs Assistance Bed Mobility: Supine to Sit;Sit to Supine     Supine to sit: Min guard Sit to supine: Min guard   General bed mobility comments: cues for sequence and use of L LE to self assist  Transfers Overall transfer level: Needs assistance Equipment used: Rolling walker (2 wheeled) Transfers: Sit to/from Stand Sit to Stand: Min guard;Supervision         General transfer comment: cues for LE management and use of UEs to self assist  Ambulation/Gait Ambulation/Gait assistance: Min guard;Supervision Gait Distance (Feet): 150 Feet Assistive device: Rolling walker (2 wheeled) Gait Pattern/deviations: Step-to pattern;Step-through pattern;Decreased step length - right;Decreased step length - left;Shuffle;Trunk flexed Gait velocity: decr   General Gait Details: cues for posture, position from RW and initial sequence  Stairs Stairs: Yes Stairs assistance: Min assist Stair Management: Two rails;Step to pattern;Forwards Number of Stairs: 5 General stair comments: min cues for sequence and foot placement.  Dtr present  Wheelchair Mobility    Modified Rankin (Stroke Patients Only)       Balance                                              Pertinent Vitals/Pain Pain Assessment: 0-10 Pain Score: 6  Pain Location: R hip Pain Descriptors / Indicators: Aching;Sore Pain Intervention(s): Limited activity within patient's tolerance;Monitored during session;Premedicated before session;Ice applied    Home Living                        Prior Function                 Hand Dominance        Extremity/Trunk Assessment                Communication      Cognition Arousal/Alertness: Awake/alert Behavior During Therapy: WFL for tasks assessed/performed Overall Cognitive Status: Within Functional Limits for tasks assessed                                        General Comments      Exercises     Assessment/Plan    PT Assessment    PT Problem List         PT Treatment Interventions      PT Goals (Current goals can be found in the Care Plan section)  Acute Rehab PT Goals Patient Stated Goal: Regain IND PT Goal Formulation: With patient Time For Goal Achievement: 05/24/18 Potential to Achieve Goals: Good    Frequency 7X/week   Barriers to discharge  Co-evaluation               AM-PAC PT "6 Clicks" Daily Activity  Outcome Measure Difficulty turning over in bed (including adjusting bedclothes, sheets and blankets)?: A Lot Difficulty moving from lying on back to sitting on the side of the bed? : A Lot Difficulty sitting down on and standing up from a chair with arms (e.g., wheelchair, bedside commode, etc,.)?: A Lot Help needed moving to and from a bed to chair (including a wheelchair)?: A Little Help needed walking in hospital room?: A Little Help needed climbing 3-5 steps with a railing? : A Little 6 Click Score: 15    End of Session Equipment Utilized During Treatment: Gait belt Activity Tolerance: Patient tolerated treatment well Patient left: in bed;with nursing/sitter in room;with family/visitor present Nurse Communication: Mobility status PT  Visit Diagnosis: Difficulty in walking, not elsewhere classified (R26.2)    Time: 1010-1040 PT Time Calculation (min) (ACUTE ONLY): 30 min   Charges:     PT Treatments $Gait Training: 8-22 mins $Therapeutic Activity: 8-22 mins   PT G Codes:        Pg [phone]   Osborn Pullin 05/18/2018, 12:39 PM

## 2018-05-18 NOTE — Progress Notes (Signed)
   Subjective: 2 Days Post-Op Procedure(s) (LRB): RIGHT TOTAL HIP ARTHROPLASTY ANTERIOR APPROACH (Right) Patient reports pain as mild.   Patient seen in rounds for Dr. Wynelle Link. Patient is well, and has had no acute complaints or problems. Pt states his pain in the right hip is much improved this AM and that he is ready to go home. Voiding without difficulty and positive flatus. Denies chest pain or SOB. Plan is to go Home after hospital stay.  Objective: Vital signs in last 24 hours: Temp:  [97.6 F (36.4 C)-97.9 F (36.6 C)] 97.8 F (36.6 C) (06/21 0600) Pulse Rate:  [62-70] 62 (06/21 0600) Resp:  [16-18] 16 (06/21 0600) BP: (109-133)/(71-79) 109/71 (06/21 0600) SpO2:  [90 %-98 %] 96 % (06/21 0600)  Intake/Output from previous day:  Intake/Output Summary (Last 24 hours) at 05/18/2018 0745 Last data filed at 05/18/2018 0405 Gross per 24 hour  Intake 1310 ml  Output 2700 ml  Net -1390 ml    Labs: Recent Labs    05/17/18 0523 05/18/18 0519  HGB 12.7* 11.6*   Recent Labs    05/17/18 0523 05/18/18 0519  WBC 13.0* 14.6*  RBC 3.88* 3.58*  HCT 38.4* 35.6*  PLT 215 194   Recent Labs    05/17/18 0523 05/18/18 0519  NA 136 136  K 4.4 4.8  CL 106 107  CO2 23 23  BUN 29* 31*  CREATININE 1.43* 1.32*  GLUCOSE 286* 257*  CALCIUM 8.5* 8.4*   Exam: General - Patient is Alert and Oriented Extremity - Neurologically intact Neurovascular intact Sensation intact distally Dorsiflexion/Plantar flexion intact Dressing/Incision - clean, dry, no drainage Motor Function - intact, moving foot and toes well on exam.   Past Medical History:  Diagnosis Date  . Arthritis    oa bo hips  . 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 Cough last week   no phlegm jill dr Wynelle Link pa aware  . Diabetes mellitus without complication ([place])   . History of kidney stones    years ago, passed on own  . Hyperlipidemia   . Hypertension   . Vertigo    jan   2019 surgery rescheduled ent dr Laurance Flatten [place] no vertigo since end of january    Assessment/Plan: 2 Days Post-Op Procedure(s) (LRB): RIGHT TOTAL HIP ARTHROPLASTY ANTERIOR APPROACH (Right) Active Problems:   OA (osteoarthritis) of hip  Estimated body mass index is 34.93 kg/m as calculated from the following:   Height as of this encounter: 5\' 7"  (1.702 m).   Weight as of this encounter: 101.2 kg (223 lb). Up with therapy D/C IV fluids  DVT Prophylaxis - Aspirin Weight-bearing as tolerated  Plan for discharge to home with HEP today after therapy session if meeting goals and stable. Follow-up in office in 2 weeks.   Theresa Duty, PA-C Orthopedic Surgery 05/18/2018, 7:45 AM

## 2018-05-18 NOTE — Progress Notes (Signed)
Patient's pain controlled overnight. Patient ambulating to bathroom. Vitals stable.

## 2018-05-18 NOTE — Progress Notes (Signed)
Discharge paperwork and prescriptions provided and discussed with pt and pt's Daughter. Pt and pt's Daughter report no further questions or concerns.

## 2018-05-21 NOTE — Discharge Summary (Signed)
Physician Discharge Summary   Patient ID: Phillip Neal MRN: [id number] DOB/AGE: 82-Apr-1932 82 y.o.  Admit date: 05/16/2018 Discharge date: 05/18/2018  Primary Diagnosis: Osteoarthritis right hip  Admission Diagnoses:  Past Medical History:  Diagnosis Date  . Arthritis    oa bo hips  . Cancer ([place])    squamous cell removed 3 yrs ago from upper chest by neck  . Cough last week   no phlegm jill dr Wynelle Link pa aware  . Diabetes mellitus without complication ([place])   . History of kidney stones    years ago, passed on own  . Hyperlipidemia   . Hypertension   . Vertigo    jan  2019 surgery rescheduled ent dr Laurance Flatten [place] no vertigo since end of january   Discharge Diagnoses:   Active Problems:   OA (osteoarthritis) of hip  Estimated body mass index is 34.93 kg/m as calculated from the following:   Height as of this encounter: _0  (1.702 m).   Weight as of this encounter: 101.2 kg (223 lb).  Procedure(s) (LRB): RIGHT TOTAL HIP ARTHROPLASTY ANTERIOR APPROACH (Right)   Consults: None  HPI: Phillip Neal is a 84 y.o. male who has advanced end-stage arthritis of their Right  hip with progressively worsening pain and dysfunction.The patient has failed nonoperative management and presents for  total hip arthroplasty.  Laboratory Data: Admission on 05/16/2018, Discharged on 05/18/2018  Component Date Value Ref Range Status  . Glucose-Capillary 05/16/2018 113* 65 - 99 mg/dL Final  . Glucose-Capillary 05/16/2018 92  65 - 99 mg/dL Final  . WBC 05/17/2018 13.0* 4.0 - 10.5 K/uL Final  . RBC 05/17/2018 3.88* 4.22 - 5.81 MIL/uL Final  . Hemoglobin 05/17/2018 12.7* 13.0 - 17.0 g/dL Final  . HCT 05/17/2018 38.4* 39.0 - 52.0 % Final  . MCV 05/17/2018 99.0  78.0 - 100.0 fL Final  . MCH 05/17/2018 32.7  26.0 - 34.0 pg Final  . MCHC 05/17/2018 33.1  30.0 - 36.0 g/dL Final  . RDW 05/17/2018 12.9  11.5 - 15.5 % Final  . Platelets 05/17/2018 215  150 - 400 K/uL Final   Performed at [facility], [place] [address], [place], [place] [address]  . Sodium 05/17/2018 136  135 - 145 mmol/L Final  . Potassium 05/17/2018 4.4  3.5 - 5.1 mmol/L Final  . Chloride 05/17/2018 106  101 - 111 mmol/L Final  . CO2 05/17/2018 23  22 - 32 mmol/L Final  . Glucose, Bld 05/17/2018 286* 65 - 99 mg/dL Final  . BUN 05/17/2018 29* 6 - 20 mg/dL Final  . Creatinine, Ser 05/17/2018 1.43* 0.61 - 1.24 mg/dL Final  . Calcium 05/17/2018 8.5* 8.9 - 10.3 mg/dL Final  . GFR calc non Af Amer 05/17/2018 43* >60 mL/min Final  . GFR calc Af Amer 05/17/2018 50* >60 mL/min Final   Comment: (NOTE) The eGFR has been calculated using the CKD EPI equation. This calculation has not been validated in all clinical situations. eGFR's persistently <60 mL/min signify possible Chronic Kidney Disease.   Georgiann Hahn gap 05/17/2018 7  5 - 15 Final   Performed at [facility], [place] [address] [place], [place] [address]  . Glucose-Capillary 05/16/2018 100* 65 - 99 mg/dL Final  . Glucose-Capillary 05/16/2018 239* 65 - 99 mg/dL Final  . Glucose-Capillary 05/17/2018 284* 65 - 99 mg/dL Final  . Glucose-Capillary 05/17/2018 203* 65 - 99 mg/dL Final  . WBC 05/18/2018 14.6* 4.0 - 10.5 K/uL Final  . RBC  05/18/2018 3.58* 4.22 - 5.81 MIL/uL Final  . Hemoglobin 05/18/2018 11.6* 13.0 - 17.0 g/dL Final  . HCT 05/18/2018 35.6* 39.0 - 52.0 % Final  . MCV 05/18/2018 99.4  78.0 - 100.0 fL Final  . MCH 05/18/2018 32.4  26.0 - 34.0 pg Final  . MCHC 05/18/2018 32.6  30.0 - 36.0 g/dL Final  . RDW 05/18/2018 12.8  11.5 - 15.5 % Final  . Platelets 05/18/2018 194  150 - 400 K/uL Final   Performed at [facility], [place] [address], [place], [place] [address]  . Sodium 05/18/2018 136  135 - 145 mmol/L Final  . Potassium 05/18/2018 4.8  3.5 - 5.1 mmol/L Final  . Chloride 05/18/2018 107  101 - 111 mmol/L Final  . CO2 05/18/2018 23  22 - 32 mmol/L Final  . Glucose, Bld 05/18/2018  257* 65 - 99 mg/dL Final  . BUN 05/18/2018 31* 6 - 20 mg/dL Final  . Creatinine, Ser 05/18/2018 1.32* 0.61 - 1.24 mg/dL Final  . Calcium 05/18/2018 8.4* 8.9 - 10.3 mg/dL Final  . GFR calc non Af Amer 05/18/2018 47* >60 mL/min Final  . GFR calc Af Amer 05/18/2018 55* >60 mL/min Final   Comment: (NOTE) The eGFR has been calculated using the CKD EPI equation. This calculation has not been validated in all clinical situations. eGFR's persistently <60 mL/min signify possible Chronic Kidney Disease.   Georgiann Hahn gap 05/18/2018 6  5 - 15 Final   Performed at [facility], [place] [address] [place], [place] [address]  . Glucose-Capillary 05/17/2018 239* 65 - 99 mg/dL Final  . Glucose-Capillary 05/17/2018 243* 65 - 99 mg/dL Final  . Glucose-Capillary 05/18/2018 204* 65 - 99 mg/dL Final  Hospital Outpatient Visit on 05/10/2018  Component Date Value Ref Range Status  . aPTT 05/10/2018 27  24 - 36 seconds Final   Performed at [facility], [place] [address] [place], [place] [address]  . WBC 05/10/2018 10.3  4.0 - 10.5 K/uL Final  . RBC 05/10/2018 4.17* 4.22 - 5.81 MIL/uL Final  . Hemoglobin 05/10/2018 13.7  13.0 - 17.0 g/dL Final  . HCT 05/10/2018 42.2  39.0 - 52.0 % Final  . MCV 05/10/2018 101.2* 78.0 - 100.0 fL Final  . MCH 05/10/2018 32.9  26.0 - 34.0 pg Final  . MCHC 05/10/2018 32.5  30.0 - 36.0 g/dL Final  . RDW 05/10/2018 13.1  11.5 - 15.5 % Final  . Platelets 05/10/2018 237  150 - 400 K/uL Final   Performed at [facility], [place] [address], [place], [place] [address]  . Sodium 05/10/2018 144  135 - 145 mmol/L Final  . Potassium 05/10/2018 4.5  3.5 - 5.1 mmol/L Final  . Chloride 05/10/2018 109  101 - 111 mmol/L Final  . CO2 05/10/2018 25  22 - 32 mmol/L Final  . Glucose, Bld 05/10/2018 145* 65 - 99 mg/dL Final  . BUN 05/10/2018 36* 6 - 20 mg/dL Final  . Creatinine, Ser 05/10/2018 1.29* 0.61 - 1.24 mg/dL Final  . Calcium 05/10/2018 9.8   8.9 - 10.3 mg/dL Final  . Total Protein 05/10/2018 6.9  6.5 - 8.1 g/dL Final  . Albumin 05/10/2018 4.0  3.5 - 5.0 g/dL Final  . AST 05/10/2018 21  15 - 41 U/L Final  . ALT 05/10/2018 20  17 - 63 U/L Final  . Alkaline Phosphatase 05/10/2018 68  38 - 126 U/L Final  . Total Bilirubin 05/10/2018 0.8  0.3 - 1.2 mg/dL Final  . GFR calc non Af Amer 05/10/2018 49* >60 mL/min Final  . GFR calc Af Amer 05/10/2018 57* >60 mL/min Final   Comment: (NOTE) The eGFR has been calculated using the CKD EPI equation. This calculation has not been validated in all clinical situations. eGFR's persistently <60 mL/min signify possible Chronic Kidney Disease.   Georgiann Hahn gap 05/10/2018 10  5 - 15 Final   Performed at [facility], [place] [address], [place], [place] [address]  . Prothrombin Time 05/10/2018 12.7  11.4 - 15.2 seconds Final  . INR 05/10/2018 0.96   Final   Performed at [facility], [place] [address] [place], [place] [address]  . ABO/RH(D) 05/10/2018 O POS   Final  . Antibody Screen 05/10/2018 NEG   Final  . Sample Expiration 05/10/2018 05/19/2018   Final  . Extend sample reason 05/10/2018    Final                   Value:NO TRANSFUSIONS OR PREGNANCY IN THE PAST 3 MONTHS Performed at [facility], [place] [address], [place], [place] [address]   . Color, Urine 05/10/2018 YELLOW  YELLOW Final  . APPearance 05/10/2018 CLEAR  CLEAR Final  . Specific Gravity, Urine 05/10/2018 1.018  1.005 - 1.030 Final  . pH 05/10/2018 5.0  5.0 - 8.0 Final  . Glucose, UA 05/10/2018 NEGATIVE  NEGATIVE mg/dL Final  . Hgb urine dipstick 05/10/2018 NEGATIVE  NEGATIVE Final  . Bilirubin Urine 05/10/2018 NEGATIVE  NEGATIVE Final  . Ketones, ur 05/10/2018 NEGATIVE  NEGATIVE mg/dL Final  . Protein, ur 05/10/2018 NEGATIVE  NEGATIVE mg/dL Final  . Nitrite 05/10/2018 NEGATIVE  NEGATIVE Final  . Leukocytes, UA 05/10/2018 NEGATIVE  NEGATIVE Final   Performed at [place] [address] [place], [place] [address]  . MRSA, PCR 05/10/2018 NEGATIVE  NEGATIVE Final  . Staphylococcus aureus 05/10/2018 POSITIVE* NEGATIVE Final   Comment: (NOTE) The Xpert SA Assay (FDA approved for NASAL specimens in patients 67 years of age and older), is one component of a comprehensive surveillance program. It is not intended to diagnose infection nor to guide or monitor treatment. Performed at [facility], [place] [address], [place], [place] [address]   . Glucose-Capillary 05/10/2018 133* 65 - 99 mg/dL Final  . Comment 1 05/10/2018 Notify RN   Final  . Comment 2 05/10/2018 Document in Chart   Final  . Hgb A1c MFr Bld 05/10/2018 6.8* 4.8 - 5.6 % Final   Comment: (NOTE) Pre diabetes:          5.7%-6.4% Diabetes:              >6.4% Glycemic control for   <7.0% adults with diabetes   . Mean Plasma Glucose 05/10/2018 148.46  mg/dL Final   Performed at [place] [address], [place], [place] [address]     X-Rays:Dg Pelvis Portable  Result Date: 05/16/2018 CLINICAL DATA:  Status post RIGHT hip arthroplasty. EXAM: PORTABLE PELVIS 1-2 VIEWS COMPARISON:  Plain film of the pelvis dated 01/31/2018. FINDINGS: Interval RIGHT hip arthroplasty. New hardware appears intact and appropriately positioned. Expected postsurgical changes within the overlying soft tissues. Drainage catheter in place with tip lateral to the femoral head component. Stable alignment of the LEFT hip arthroplasty. IMPRESSION: Status post RIGHT hip arthroplasty. No evidence of surgical complicating feature. Electronically Signed   By: Roxy Horseman.D.  On: 05/16/2018 16:25   Dg C-arm 1-60 Min-no Report  Result Date: 05/16/2018 Fluoroscopy was utilized by the requesting physician.  No radiographic interpretation.    EKG: Orders placed or performed in visit on 04/30/18  . EKG 12-Lead     Hospital Course: Patient was admitted to [facility] and taken  to the OR and underwent the above state procedure without complications.  Patient tolerated the procedure well and was later transferred to the recovery room and then to the orthopaedic floor for postoperative care.  They were given PO and IV analgesics for pain control following their surgery.  They were given 24 hours of postoperative antibiotics of  Anti-infectives (From admission, onward)   Start     Dose/Rate Route Frequency Ordered Stop   05/16/18 2000  ceFAZolin (ANCEF) IVPB 2g/100 mL premix     2 g 200 mL/hr over 30 Minutes Intravenous Every 6 hours 05/16/18 1654 05/17/18 0319   05/16/18 1300  ceFAZolin (ANCEF) IVPB 2g/100 mL premix     2 g 200 mL/hr over 30 Minutes Intravenous On call to O.R. 05/16/18 1250 05/16/18 1358     and started on DVT prophylaxis in the form of Aspirin.   PT and OT were ordered for total hip protocol.  The patient was allowed to be WBAT with therapy. Discharge planning was consulted to help with postop disposition and equipment needs.  Patient had a decent night on the evening of surgery. They started to get OOB with therapy on POD #1. Hemovac was pulled without difficulty. Pt was seen in rounds on POD #2 and stated he was feeling well and was ready to go home. Dressing was changed on day two and the incision was clean, dry, and intact with no drainage. Pt worked with therapy for two additional sessions on POD #2, was meeting his goals and was discharged to home in stable condition.  Diet: Diabetic diet Activity:WBAT Follow-up: in 2 weeks in the office with Dr. Wynelle Link Disposition - Home with HEP Discharged Condition: stable   Discharge Instructions    Call MD / Call 911   Complete by:  As directed    If you experience chest pain or shortness of breath, CALL 911 and be transported to the hospital emergency room.  If you develope a fever above 101 F, pus (white drainage) or increased drainage or redness at the wound, or calf pain, call your surgeon's office.    Change dressing   Complete by:  As directed    Change the dressing daily with sterile 4 x 4 inch gauze dressing and paper tape.  You may clean the incision with alcohol prior to redressing   Constipation Prevention   Complete by:  As directed    Drink plenty of fluids.  Prune juice may be helpful.  You may use a stool softener, such as Colace (over the counter) 100 mg twice a day.  Use MiraLax (over the counter) for constipation as needed.   Diet - low sodium heart healthy   Complete by:  As directed    Discharge instructions   Complete by:  As directed    Dr. Gaynelle Arabian Total Joint Specialist [facility] [address] [place], [place] [address] [phone]  ANTERIOR APPROACH TOTAL HIP REPLACEMENT POSTOPERATIVE DIRECTIONS   Hip Rehabilitation, Guidelines Following Surgery  The results of a hip operation are greatly improved after range of motion and muscle strengthening exercises. Follow all safety measures which are given to protect  your hip. If any of these exercises cause increased pain or swelling in your joint, decrease the amount until you are comfortable again. Then slowly increase the exercises. Call your caregiver if you have problems or questions.   HOME CARE INSTRUCTIONS  Remove items at home which could result in a fall. This includes throw rugs or furniture in walking pathways.  ICE to the affected hip every three hours for 30 minutes at a time and then as needed for pain and swelling.  Continue to use ice on the hip for pain and swelling from surgery. You may notice swelling that will progress down to the foot and ankle.  This is normal after surgery.  Elevate the leg when you are not up walking on it.   Continue to use the breathing machine which will help keep your temperature down.  It is common for your temperature to cycle up and down following surgery, especially at night when you are not up moving around and exerting yourself.  The breathing machine  keeps your lungs expanded and your temperature down.  DIET You may resume your previous home diet once your are discharged from the hospital.  DRESSING / WOUND CARE / SHOWERING You may shower 3 days after surgery, but keep the wounds dry during showering.  You may use an occlusive plastic wrap (Press'n Seal for example), NO SOAKING/SUBMERGING IN THE BATHTUB.  If the bandage gets wet, change with a clean dry gauze.  If the incision gets wet, pat the wound dry with a clean towel. You may start showering once you are discharged home but do not submerge the incision under water. Just pat the incision dry and apply a dry gauze dressing on daily. Change the surgical dressing daily and reapply a dry dressing each time.  ACTIVITY Walk with your walker as instructed. Use walker as long as suggested by your caregivers. Avoid periods of inactivity such as sitting longer than an hour when not asleep. This helps prevent blood clots.  You may resume a sexual relationship in one month or when given the OK by your doctor.  You may return to work once you are cleared by your doctor.  Do not drive a car for 6 weeks or until released by you surgeon.  Do not drive while taking narcotics.  WEIGHT BEARING Weight bearing as tolerated with assist device (walker, cane, etc) as directed, use it as long as suggested by your surgeon or therapist, typically at least 4-6 weeks.  POSTOPERATIVE CONSTIPATION PROTOCOL Constipation - defined medically as fewer than three stools per week and severe constipation as less than one stool per week.  One of the most common issues patients have following surgery is constipation.  Even if you have a regular bowel pattern at home, your normal regimen is likely to be disrupted due to multiple reasons following surgery.  Combination of anesthesia, postoperative narcotics, change in appetite and fluid intake all can affect your bowels.  In order to avoid complications following surgery,  here are some recommendations in order to help you during your recovery period.  Colace (docusate) - Pick up an over-the-counter form of Colace or another stool softener and take twice a day as long as you are requiring postoperative pain medications.  Take with a full glass of water daily.  If you experience loose stools or diarrhea, hold the colace until you stool forms back up.  If your symptoms do not get better within 1 week or if they get worse,  check with your doctor.  Dulcolax (bisacodyl) - Pick up over-the-counter and take as directed by the product packaging as needed to assist with the movement of your bowels.  Take with a full glass of water.  Use this product as needed if not relieved by Colace only.   MiraLax (polyethylene glycol) - Pick up over-the-counter to have on hand.  MiraLax is a solution that will increase the amount of water in your bowels to assist with bowel movements.  Take as directed and can mix with a glass of water, juice, soda, coffee, or tea.  Take if you go more than two days without a movement. Do not use MiraLax more than once per day. Call your doctor if you are still constipated or irregular after using this medication for 7 days in a row.  If you continue to have problems with postoperative constipation, please contact the office for further assistance and recommendations.  If you experience "the worst abdominal pain ever" or develop nausea or vomiting, please contact the office immediatly for further recommendations for treatment.  ITCHING  If you experience itching with your medications, try taking only a single pain pill, or even half a pain pill at a time.  You can also use Benadryl over the counter for itching or also to help with sleep.   TED HOSE STOCKINGS Wear the elastic stockings on both legs for three weeks following surgery during the day but you may remove then at night for sleeping.  MEDICATIONS See your medication summary on the "After Visit  Summary" that the nursing staff will review with you prior to discharge.  You may have some home medications which will be placed on hold until you complete the course of blood thinner medication.  It is important for you to complete the blood thinner medication as prescribed by your surgeon.  Continue your approved medications as instructed at time of discharge.  PRECAUTIONS If you experience chest pain or shortness of breath - call 911 immediately for transfer to the hospital emergency department.  If you develop a fever greater that 101 F, purulent drainage from wound, increased redness or drainage from wound, foul odor from the wound/dressing, or calf pain - CONTACT YOUR SURGEON.                                                   FOLLOW-UP APPOINTMENTS Make sure you keep all of your appointments after your operation with your surgeon and caregivers. You should call the office at the above phone number and make an appointment for approximately two weeks after the date of your surgery or on the date instructed by your surgeon outlined in the "After Visit Summary".  RANGE OF MOTION AND STRENGTHENING EXERCISES  These exercises are designed to help you keep full movement of your hip joint. Follow your caregiver's or physical therapist's instructions. Perform all exercises about fifteen times, three times per day or as directed. Exercise both hips, even if you have had only one joint replacement. These exercises can be done on a training (exercise) mat, on the floor, on a table or on a bed. Use whatever works the best and is most comfortable for you. Use music or television while you are exercising so that the exercises are a pleasant break in your day. This will make your life better with the  exercises acting as a break in routine you can look forward to.  Lying on your back, slowly slide your foot toward your buttocks, raising your knee up off the floor. Then slowly slide your foot back down until your leg  is straight again.  Lying on your back spread your legs as far apart as you can without causing discomfort.  Lying on your side, raise your upper leg and foot straight up from the floor as far as is comfortable. Slowly lower the leg and repeat.  Lying on your back, tighten up the muscle in the front of your thigh (quadriceps muscles). You can do this by keeping your leg straight and trying to raise your heel off the floor. This helps strengthen the largest muscle supporting your knee.  Lying on your back, tighten up the muscles of your buttocks both with the legs straight and with the knee bent at a comfortable angle while keeping your heel on the floor.   IF YOU ARE TRANSFERRED TO A SKILLED REHAB FACILITY If the patient is transferred to a skilled rehab facility following release from the hospital, a list of the current medications will be sent to the facility for the patient to continue.  When discharged from the skilled rehab facility, please have the facility set up the patient's [place] prior to being released. Also, the skilled facility will be responsible for providing the patient with their medications at time of release from the facility to include their pain medication, the muscle relaxants, and their blood thinner medication. If the patient is still at the rehab facility at time of the two week follow up appointment, the skilled rehab facility will also need to assist the patient in arranging follow up appointment in our office and any transportation needs.  MAKE SURE YOU:  Understand these instructions.  Get help right away if you are not doing well or get worse.    Pick up stool softner and laxative for home use following surgery while on pain medications. Do not submerge incision under water. Please use good hand washing techniques while changing dressing each day. May shower starting three days after surgery. Please use a clean towel to pat the incision dry  following showers. Continue to use ice for pain and swelling after surgery. Do not use any lotions or creams on the incision until instructed by your surgeon.   Do not sit on low chairs, stoools or toilet seats, as it may be difficult to get up from low surfaces   Complete by:  As directed    Driving restrictions   Complete by:  As directed    No driving for two weeks   TED hose   Complete by:  As directed    Use stockings (TED hose) for three weeks on both leg(s).  You may remove them at night for sleeping.   Weight bearing as tolerated   Complete by:  As directed      Allergies as of 05/18/2018      Reactions   Ciprofloxacin Other (See Comments)   Unknown   Other    BEE STINGS AND YELLOW JACKETS HAVE REQUIRED MEDICAL TREATMENT IN PAST   Statins Other (See Comments)   Unknown   Sulfamethoxazole Itching, Rash      Medication List    TAKE these medications   aspirin 325 MG EC tablet Take 1 tablet (325 mg total) by mouth 2 (two) times daily for 19 days. Take one tablet (325  mg) Aspirin two times a day for three weeks following surgery. Then resume one baby Aspirin (81 mg) once a day. What changed:    medication strength  how much to take  when to take this  additional instructions   clotrimazole-betamethasone cream Commonly known as:  LOTRISONE Apply 1 application topically 2 (two) times daily as needed (for groin rash/itch).   Co Q 10 100 MG Caps Take 100 mg by mouth 2 (two) times daily.   ezetimibe 10 MG tablet Commonly known as:  ZETIA Take 1 tablet (10 mg total) by mouth daily.   fluticasone 50 MCG/ACT nasal spray Commonly known as:  FLONASE Place 2 sprays into both nostrils daily as needed for allergies or rhinitis.   hydrochlorothiazide 12.5 MG tablet Commonly known as:  HYDRODIURIL Take 12.5 mg by mouth daily.   lisinopril 40 MG tablet Commonly known as:  PRINIVIL,ZESTRIL Take 40 mg by mouth daily.   meclizine 12.5 MG tablet Commonly known as:   ANTIVERT Take 12.5 mg by mouth 3 (three) times daily as needed for dizziness.   metFORMIN 500 MG 24 hr tablet Commonly known as:  GLUCOPHAGE-XR Take 1,000 mg by mouth 2 (two) times daily.   methocarbamol 500 MG tablet Commonly known as:  ROBAXIN Take 1 tablet (500 mg total) by mouth every 6 (six) hours as needed for muscle spasms.   oxyCODONE 5 MG immediate release tablet Commonly known as:  Oxy IR/ROXICODONE Take 1-2 tablets (5-10 mg total) by mouth every 4 (four) hours as needed for moderate pain or severe pain.   traMADol 50 MG tablet Commonly known as:  ULTRAM Take 1-2 tablets (50-100 mg total) by mouth every 6 (six) hours as needed for moderate pain (refractory to oxycodone).   vitamin B-12 1000 MCG tablet Commonly known as:  CYANOCOBALAMIN Take 1,000 mcg by mouth daily.            Discharge Care Instructions  (From admission, onward)        Start     Ordered   05/18/18 0000  Weight bearing as tolerated     05/18/18 0750   05/18/18 0000  Change dressing    Comments:  Change the dressing daily with sterile 4 x 4 inch gauze dressing and paper tape.  You may clean the incision with alcohol prior to redressing   05/18/18 0750     Follow-up Information    Gaynelle Arabian, MD Follow up in 2 week(s).   Specialty:  Orthopedic Surgery Contact information: [address] [place] [address] [phone]           Signed: Theresa Duty, PA-C Orthopaedic Surgery 05/21/2018, 9:13 AM

## 2018-05-28 DIAGNOSIS — Z8639 Personal history of other endocrine, nutritional and metabolic disease: Secondary | ICD-10-CM | POA: Insufficient documentation

## 2018-05-28 HISTORY — DX: Personal history of other endocrine, nutritional and metabolic disease: Z86.39

## 2018-08-28 ENCOUNTER — Encounter: Payer: Self-pay | Admitting: Cardiology

## 2018-08-28 ENCOUNTER — Ambulatory Visit (INDEPENDENT_AMBULATORY_CARE_PROVIDER_SITE_OTHER): Payer: Medicare Other | Admitting: Cardiology

## 2018-08-28 VITALS — BP 148/72 | HR 68 | Ht 69.0 in | Wt 224.4 lb

## 2018-08-28 DIAGNOSIS — E782 Mixed hyperlipidemia: Secondary | ICD-10-CM | POA: Diagnosis not present

## 2018-08-28 DIAGNOSIS — N183 Chronic kidney disease, stage 3 unspecified: Secondary | ICD-10-CM

## 2018-08-28 DIAGNOSIS — I10 Essential (primary) hypertension: Secondary | ICD-10-CM

## 2018-08-28 DIAGNOSIS — I452 Bifascicular block: Secondary | ICD-10-CM

## 2018-08-28 DIAGNOSIS — E1122 Type 2 diabetes mellitus with diabetic chronic kidney disease: Secondary | ICD-10-CM

## 2018-08-28 NOTE — Patient Instructions (Signed)

## 2018-08-28 NOTE — Progress Notes (Signed)
Cardiology Office Note:    Date:  08/28/2018   ID:  Phillip Neal, DOB 1931/03/30, MRN [id number]  PCP:  Raina Mina., MD  Cardiologist:  Jenne Campus, MD    Referring MD: Raina Mina., MD   Chief Complaint  Patient presents with  . 2 month follow up  Doing very well, had his bilateral hip surgery done and is very happy with the results  History of Present Illness:    Phillip Neal is a 84 y.o. male with bifascicular block.  Denies have any problems he does have some chronic ear problem which making him hard of hearing and also some dizzy sometimes he did wear a monitor which did not show any significant bradycardia.  Denies have any chest pain tightness squeezing pressure burning chest there was no complication of surgery.  Past Medical History:  Diagnosis Date  . Arthritis    oa bo hips  . Cancer ([place])    squamous cell removed 3 yrs ago from upper chest by neck  . Cough last week   no phlegm jill dr Wynelle Link pa aware  . Diabetes mellitus without complication ([place])   . History of kidney stones    years ago, passed on own  . Hyperlipidemia   . Hypertension   . Vertigo    jan  2019 surgery rescheduled ent dr Laurance Flatten [place] no vertigo since end of january    Past Surgical History:  Procedure Laterality Date  . colonscopy     with polyp removal  . HERNIA REPAIR  1954   left inguinal hernia  . TOTAL HIP ARTHROPLASTY Left 01/31/2018   Procedure: LEFT TOTAL HIP ARTHROPLASTY ANTERIOR APPROACH;  Surgeon: Gaynelle Arabian, MD;  Location: [facility];  Service: Orthopedics;  Laterality: Left;  . TOTAL HIP ARTHROPLASTY Right 05/16/2018   Procedure: RIGHT TOTAL HIP ARTHROPLASTY ANTERIOR APPROACH;  Surgeon: Gaynelle Arabian, MD;  Location: [facility];  Service: Orthopedics;  Laterality: Right;    Current Medications: Current Meds  Medication Sig  . clotrimazole-betamethasone (LOTRISONE) cream Apply 1 application topically 2 (two) times daily as needed (for groin rash/itch).    . Coenzyme Q10 (CO Q 10) 100 MG CAPS Take 100 mg by mouth 2 (two) times daily.  Marland Kitchen ezetimibe (ZETIA) 10 MG tablet Take 1 tablet (10 mg total) by mouth daily.  . fluticasone (FLONASE) 50 MCG/ACT nasal spray Place 2 sprays into both nostrils daily as needed for allergies or rhinitis.  . hydrochlorothiazide (HYDRODIURIL) 12.5 MG tablet Take 12.5 mg by mouth daily.   Marland Kitchen lisinopril (PRINIVIL,ZESTRIL) 40 MG tablet Take 40 mg by mouth daily.   . metFORMIN (GLUCOPHAGE-XR) 500 MG 24 hr tablet Take 1,000 mg by mouth 2 (two) times daily.   . vitamin B-12 (CYANOCOBALAMIN) 1000 MCG tablet Take 1,000 mcg by mouth daily.     Allergies:   Ciprofloxacin; Other; Statins; and Sulfamethoxazole   Social History   Socioeconomic History  . Marital status: Married    Spouse name: Not on file  . Number of children: Not on file  . Years of education: Not on file  . Highest education level: Not on file  Occupational History  . Not on file  Social Needs  . Financial resource strain: Not on file  . Food insecurity:    Worry: Not on file    Inability: Not on file  . Transportation needs:    Medical: Not on file    Non-medical: Not on file  Tobacco Use  . Smoking  status: Never Smoker  . Smokeless tobacco: Never Used  Substance and Sexual Activity  . Alcohol use: Never    Frequency: Never  . Drug use: Never  . Sexual activity: Not on file  Lifestyle  . Physical activity:    Days per week: Not on file    Minutes per session: Not on file  . Stress: Not on file  Relationships  . Social connections:    Talks on phone: Not on file    Gets together: Not on file    Attends religious service: Not on file    Active member of club or organization: Not on file    Attends meetings of clubs or organizations: Not on file    Relationship status: Not on file  Other Topics Concern  . Not on file  Social History Narrative  . Not on file     Family History: [name] family history includes Heart failure  in his mother. ROS:   Please see the history of present illness.    All 14 point review of systems negative except as described per history of present illness  EKGs/Labs/Other Studies Reviewed:      Recent Labs: 05/10/2018: ALT 20 05/18/2018: BUN 31; Creatinine, Ser 1.32; Hemoglobin 11.6; Platelets 194; Potassium 4.8; Sodium 136  Recent Lipid Panel No results found for: CHOL, TRIG, HDL, CHOLHDL, VLDL, LDLCALC, LDLDIRECT  Physical Exam:    VS:  BP (!) 148/72   Pulse 68   Ht 5\' 9"  (1.753 m)   Wt 224 lb 6.4 oz (101.8 kg)   SpO2 97%   BMI 33.14 kg/m     Wt Readings from Last 3 Encounters:  08/28/18 224 lb 6.4 oz (101.8 kg)  05/16/18 223 lb (101.2 kg)  05/10/18 223 lb 12.8 oz (101.5 kg)     GEN:  Well nourished, well developed in no acute distress HEENT: Normal NECK: No JVD; No carotid bruits LYMPHATICS: No lymphadenopathy CARDIAC: RRR, no murmurs, no rubs, no gallops RESPIRATORY:  Clear to auscultation without rales, wheezing or rhonchi  ABDOMEN: Soft, non-tender, non-distended MUSCULOSKELETAL:  No edema; No deformity  SKIN: Warm and dry LOWER EXTREMITIES: no swelling NEUROLOGIC:  Alert and oriented x 3 PSYCHIATRIC:  Normal affect   ASSESSMENT:    1. Bifascicular bundle branch block   2. Essential hypertension   3. Mixed hyperlipidemia   4. Diabetes mellitus with stage 3 chronic kidney disease (HCC)    PLAN:    In order of problems listed above:  1. Bifascicular block.  I will ask him to have a EKG today.  Asymptomatic. 2. Essential hypertension doing well from that point review continue present management. 3. Dyslipidemia followed by internal medicine team. 4. Diabetes stable on appropriate medications.   Medication Adjustments/Labs and Tests Ordered: Current medicines are reviewed at length with the patient today.  Concerns regarding medicines are outlined above.  No orders of the defined types were placed in this encounter.  Medication changes: No orders  of the defined types were placed in this encounter.   Signed, Park Liter, MD, [facility] 08/28/2018 10:35 AM    [place]

## 2018-11-07 DIAGNOSIS — J310 Chronic rhinitis: Secondary | ICD-10-CM

## 2018-11-07 HISTORY — DX: Chronic rhinitis: J31.0

## 2018-11-26 IMAGING — NM NM MYOCAR MULTI W/ SPECT
3 series · 18 of 18 positions shown · non-contrast
Comparison: none

[Series 1: stress_(id)_sa · 6.5mm · 6.51mm/px · 6 of 512 frames shown (1 of 2)]
[frame 43/512]
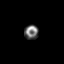
[frame 128/512]
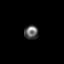
[frame 214/512]
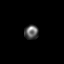
[frame 299/512]
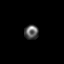
[frame 384/512]
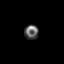
[frame 470/512]
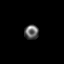

[Series 1: stress_(id)_sa · 6.5mm · 6.51mm/px · 6 of 64 frames shown (2 of 2)]
[frame 6/64]
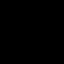
[frame 16/64]
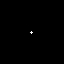
[frame 27/64]
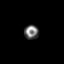
[frame 38/64]
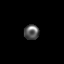
[frame 48/64]
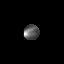
[frame 59/64]
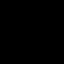

[Series 1: rest_(id)_sa · 6.5mm · 6.51mm/px · 6 of 64 frames shown]
[frame 6/64]
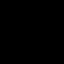
[frame 16/64]
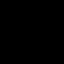
[frame 27/64]
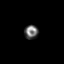
[frame 38/64]
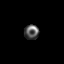
[frame 48/64]
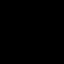
[frame 59/64]
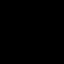

[18 of 18 positions shown; findings below may reference images not displayed]

Canned report from images found in remote index.

Refer to host system for actual result text.

## 2019-02-12 ENCOUNTER — Telehealth: Payer: Self-pay | Admitting: Emergency Medicine

## 2019-02-12 NOTE — Telephone Encounter (Signed)
Called and spoke to patient's wife with intent to reschedule the patient's follow up appointment. Patient's wife requests he be seen as she is concerned and his heart rate has been increasing when he exerts himself. I began to ask COVID 19 questions she denies that patient has been out of the country in the last 14 days and denies him being around anyone with covid 19. She was unable to answer any further screening questions for the patient. I attempted to contact patient via his cell no asnwer. I left message for him to return call.

## 2019-02-12 NOTE — Telephone Encounter (Signed)
Patient called back and confirmed he hasn't been out of the country in the last 14 days, or been around anyone with COVID 19. He also denies any fevers, shortness of breath or cough. Patient will be seen as scheduled tomorrow.

## 2019-02-13 ENCOUNTER — Ambulatory Visit (INDEPENDENT_AMBULATORY_CARE_PROVIDER_SITE_OTHER): Payer: Medicare Other | Admitting: Cardiology

## 2019-02-13 ENCOUNTER — Encounter: Payer: Self-pay | Admitting: Cardiology

## 2019-02-13 ENCOUNTER — Other Ambulatory Visit: Payer: Self-pay

## 2019-02-13 VITALS — BP 140/76 | HR 61 | Resp 16 | Ht 69.0 in | Wt 225.4 lb

## 2019-02-13 DIAGNOSIS — I10 Essential (primary) hypertension: Secondary | ICD-10-CM | POA: Diagnosis not present

## 2019-02-13 DIAGNOSIS — N183 Chronic kidney disease, stage 3 (moderate): Secondary | ICD-10-CM

## 2019-02-13 DIAGNOSIS — R0789 Other chest pain: Secondary | ICD-10-CM

## 2019-02-13 DIAGNOSIS — E1122 Type 2 diabetes mellitus with diabetic chronic kidney disease: Secondary | ICD-10-CM | POA: Diagnosis not present

## 2019-02-13 DIAGNOSIS — I452 Bifascicular block: Secondary | ICD-10-CM | POA: Diagnosis not present

## 2019-02-13 NOTE — Patient Instructions (Signed)
Medication Instructions:  Your physician recommends that you continue on your current medications as directed. Please refer to the Current Medication list given to you today.  If you need a refill on your cardiac medications before your next appointment, please call your pharmacy.   Lab work: None ordered If you have labs (blood work) drawn today and your tests are completely normal, you will receive your results only by: Marland Kitchen MyChart Message (if you have MyChart) OR . A paper copy in the mail If you have any lab test that is abnormal or we need to change your treatment, we will call you to review the results.  Testing/Procedures: None ordered  Follow-Up: At [facility], you and your health needs are our priority.  As part of our continuing mission to provide you with exceptional heart care, we have created designated Provider Care Teams.  These Care Teams include your primary Cardiologist (physician) and Advanced Practice Providers (APPs -  Physician Assistants and Nurse Practitioners) who all work together to provide you with the care you need, when you need it. You will need a follow up appointment in 3 months.  You may see  Jenne Campus or another member of our [employer] Provider Team in [place]: Shirlee More, MD . Jyl Heinz, MD

## 2019-02-13 NOTE — Progress Notes (Signed)
Cardiology Office Note:    Date:  02/13/2019   ID:  Phillip Neal, DOB 04-24-1931, MRN [id number]  PCP:  Raina Mina., MD  Cardiologist:  Jenne Campus, MD    Referring MD: Raina Mina., MD   No chief complaint on file. Doing better  History of Present Illness:    Phillip Neal is a 85 y.o. male with ventricle ectopy hypertension bifascicular bundle branch block comes today to my office for follow-up overall he is doing well however when he overworked he will feel his heart skipping more and actually our intention was to put monitor on him as well as to repeat echocardiogram however because of coronavirus situation we can postpone those tests since he is doing better he said he pace himself and if he does he does not have any problem therefore we decided to simply watch the situation which were understanding if you start having more symptoms we can proceed with at least Holter monitor if not event recorder and echocardiogram.  He is fine with this plan.  He still very active he does a lot of working the garden he is doing quite well when he does not with no difficulties.  Past Medical History:  Diagnosis Date  . Arthritis    oa bo hips  . Cancer ([place])    squamous cell removed 3 yrs ago from upper chest by neck  . Cough last week   no phlegm jill dr Wynelle Link pa aware  . Diabetes mellitus without complication ([place])   . History of kidney stones    years ago, passed on own  . Hyperlipidemia   . Hypertension   . Vertigo    jan  2019 surgery rescheduled ent dr Laurance Flatten [place] no vertigo since end of january    Past Surgical History:  Procedure Laterality Date  . colonscopy     with polyp removal  . HERNIA REPAIR  1954   left inguinal hernia  . TOTAL HIP ARTHROPLASTY Left 01/31/2018   Procedure: LEFT TOTAL HIP ARTHROPLASTY ANTERIOR APPROACH;  Surgeon: Gaynelle Arabian, MD;  Location: [facility];  Service: Orthopedics;  Laterality: Left;  . TOTAL HIP ARTHROPLASTY Right  05/16/2018   Procedure: RIGHT TOTAL HIP ARTHROPLASTY ANTERIOR APPROACH;  Surgeon: Gaynelle Arabian, MD;  Location: [facility];  Service: Orthopedics;  Laterality: Right;    Current Medications: Current Meds  Medication Sig  . clotrimazole-betamethasone (LOTRISONE) cream Apply 1 application topically 2 (two) times daily as needed (for groin rash/itch).   . Coenzyme Q10 (CO Q 10) 100 MG CAPS Take 100 mg by mouth 2 (two) times daily.  Marland Kitchen ezetimibe (ZETIA) 10 MG tablet Take 1 tablet (10 mg total) by mouth daily.  . fluticasone (FLONASE) 50 MCG/ACT nasal spray Place 2 sprays into both nostrils daily as needed for allergies or rhinitis.  . hydrochlorothiazide (HYDRODIURIL) 12.5 MG tablet Take 12.5 mg by mouth daily.   Marland Kitchen lisinopril (PRINIVIL,ZESTRIL) 40 MG tablet Take 40 mg by mouth daily.   . metFORMIN (GLUCOPHAGE-XR) 500 MG 24 hr tablet Take 1,000 mg by mouth 2 (two) times daily.   . vitamin B-12 (CYANOCOBALAMIN) 1000 MCG tablet Take 1,000 mcg by mouth daily.     Allergies:   Ciprofloxacin; Other; Statins; and Sulfamethoxazole   Social History   Socioeconomic History  . Marital status: Married    Spouse name: Not on file  . Number of children: Not on file  . Years of education: Not on file  . Highest education level: Not  on file  Occupational History  . Not on file  Social Needs  . Financial resource strain: Not on file  . Food insecurity:    Worry: Not on file    Inability: Not on file  . Transportation needs:    Medical: Not on file    Non-medical: Not on file  Tobacco Use  . Smoking status: Never Smoker  . Smokeless tobacco: Never Used  Substance and Sexual Activity  . Alcohol use: Never    Frequency: Never  . Drug use: Never  . Sexual activity: Not on file  Lifestyle  . Physical activity:    Days per week: Not on file    Minutes per session: Not on file  . Stress: Not on file  Relationships  . Social connections:    Talks on phone: Not on file    Gets together: Not on file     Attends religious service: Not on file    Active member of club or organization: Not on file    Attends meetings of clubs or organizations: Not on file    Relationship status: Not on file  Other Topics Concern  . Not on file  Social History Narrative  . Not on file     Family History: [name] family history includes Heart failure in his mother. ROS:   Please see the history of present illness.    All 14 point review of systems negative except as described per history of present illness  EKGs/Labs/Other Studies Reviewed:      Recent Labs: 05/10/2018: ALT 20 05/18/2018: BUN 31; Creatinine, Ser 1.32; Hemoglobin 11.6; Platelets 194; Potassium 4.8; Sodium 136  Recent Lipid Panel No results found for: CHOL, TRIG, HDL, CHOLHDL, VLDL, LDLCALC, LDLDIRECT  Physical Exam:    VS:  BP 140/76 (BP Location: Left Arm, Patient Position: Sitting)   Pulse 61   Resp 16   Ht 5\' 9"  (1.753 m)   Wt 225 lb 6.4 oz (102.2 kg)   SpO2 98%   BMI 33.29 kg/m     Wt Readings from Last 3 Encounters:  02/13/19 225 lb 6.4 oz (102.2 kg)  08/28/18 224 lb 6.4 oz (101.8 kg)  05/16/18 223 lb (101.2 kg)     GEN:  Well nourished, well developed in no acute distress HEENT: Normal NECK: No JVD; No carotid bruits LYMPHATICS: No lymphadenopathy CARDIAC: RRR, no murmurs, no rubs, no gallops RESPIRATORY:  Clear to auscultation without rales, wheezing or rhonchi  ABDOMEN: Soft, non-tender, non-distended MUSCULOSKELETAL:  No edema; No deformity  SKIN: Warm and dry LOWER EXTREMITIES: no swelling NEUROLOGIC:  Alert and oriented x 3 PSYCHIATRIC:  Normal affect   ASSESSMENT:    1. Essential hypertension   2. Bifascicular bundle branch block   3. Atypical chest pain   4. Diabetes mellitus with stage 3 chronic kidney disease (HCC)    PLAN:    In order of problems listed above:  1. Essential hypertension blood pressure appears to be well controlled continue present management. 2. Bifascicular block  doing well from that point review no syncope no passing out 3. Atypical chest pain denies having any 4. Diabetes mellitus followed by internal medicine team.  Stable   Medication Adjustments/Labs and Tests Ordered: Current medicines are reviewed at length with the patient today.  Concerns regarding medicines are outlined above.  No orders of the defined types were placed in this encounter.  Medication changes: No orders of the defined types were placed in this encounter.  Signed, Park Liter, MD, [facility] 02/13/2019 9:24 AM    [place]

## 2019-04-29 ENCOUNTER — Telehealth: Payer: Self-pay | Admitting: Cardiology

## 2019-04-29 NOTE — Telephone Encounter (Signed)
Virtual Visit Pre-Appointment Phone Call  "(Name), I am calling you today to discuss your upcoming appointment. We are currently trying to limit exposure to the virus that causes COVID-19 by seeing patients at home rather than in the office."  1. "What is the BEST phone number to call the day of the visit?" - include this in appointment notes  2. Do you have or have access to (through a family member/friend) a smartphone with video capability that we can use for your visit?" a. If yes - list this number in appt notes as cell (if different from BEST phone #) and list the appointment type as a VIDEO visit in appointment notes b. If no - list the appointment type as a PHONE visit in appointment notes  3. Confirm consent - "In the setting of the current Covid19 crisis, you are scheduled for a (phone or video) visit with your provider on (date) at (time).  Just as we do with many in-office visits, in order for you to participate in this visit, we must obtain consent.  If you'd like, I can send this to your mychart (if signed up) or email for you to review.  Otherwise, I can obtain your verbal consent now.  All virtual visits are billed to your insurance company just like a normal visit would be.  By agreeing to a virtual visit, we'd like you to understand that the technology does not allow for your provider to perform an examination, and thus may limit your provider's ability to fully assess your condition. If your provider identifies any concerns that need to be evaluated in person, we will make arrangements to do so.  Finally, though the technology is pretty good, we cannot assure that it will always work on either your or our end, and in the setting of a video visit, we may have to convert it to a phone-only visit.  In either situation, we cannot ensure that we have a secure connection.  Are you willing to proceed?" STAFF: Did the patient verbally acknowledge consent to telehealth visit? Document  YES/NO here: YES  4. Advise patient to be prepared - "Two hours prior to your appointment, go ahead and check your blood pressure, pulse, oxygen saturation, and your weight (if you have the equipment to check those) and write them all down. When your visit starts, your provider will ask you for this information. If you have an Apple Watch or Kardia device, please plan to have heart rate information ready on the day of your appointment. Please have a pen and paper handy nearby the day of the visit as well."  5. Give patient instructions for MyChart download to smartphone OR [email or web address] as below if video visit (depending on what platform provider is using)  6. Inform patient they will receive a phone call 15 minutes prior to their appointment time (may be from unknown caller ID) so they should be prepared to answer    TELEPHONE CALL NOTE  Phillip Neal has been deemed a candidate for a follow-up tele-health visit to limit community exposure during the Covid-19 pandemic. I spoke with the patient via phone to ensure availability of phone/video source, confirm preferred email & phone number, and discuss instructions and expectations.  I reminded Phillip Neal to be prepared with any vital sign and/or heart rhythm information that could potentially be obtained via home monitoring, at the time of his visit. I reminded Phillip Neal to expect a phone call prior to his visit.  Phillip Neal 04/29/2019 11:13 AM   INSTRUCTIONS FOR DOWNLOADING THE MYCHART APP TO SMARTPHONE  - The patient must first make sure to have activated MyChart and know their login information - If Apple, go to [employer] and type in MyChart in the search bar and download the app. If Android, ask patient to go to [employer] and type in [place] in the search bar and download the app. The app is free but as with any other app downloads, their phone may require them to verify saved payment information or  Apple/Android password.  - The patient will need to then log into the app with their MyChart username and password, and select [facility] as their healthcare provider to link the account. When it is time for your visit, go to the MyChart app, find appointments, and click Begin Video Visit. Be sure to Select Allow for your device to access the Microphone and Camera for your visit. You will then be connected, and your provider will be with you shortly.  **If they have any issues connecting, or need assistance please contact MyChart service desk [phone])**  **If using a computer, in order to ensure the best quality for their visit they will need to use either of the following Internet Browsers: [employer], or Google Chrome**  IF USING DOXIMITY or DOXY.ME - The patient will receive a link just prior to their visit by text.     FULL LENGTH CONSENT FOR TELE-HEALTH VISIT   I hereby voluntarily request, consent and authorize [facility] and its employed or contracted physicians, physician assistants, nurse practitioners or other licensed health care professionals (the Practitioner), to provide me with telemedicine health care services (the Services") as deemed necessary by the treating Practitioner. I acknowledge and consent to receive the Services by the Practitioner via telemedicine. I understand that the telemedicine visit will involve communicating with the Practitioner through live audiovisual communication technology and the disclosure of certain medical information by electronic transmission. I acknowledge that I have been given the opportunity to request an in-person assessment or other available alternative prior to the telemedicine visit and am voluntarily participating in the telemedicine visit.  I understand that I have the right to withhold or withdraw my consent to the use of telemedicine in the course of my care at any time, without affecting my right to future care  or treatment, and that the Practitioner or I may terminate the telemedicine visit at any time. I understand that I have the right to inspect all information obtained and/or recorded in the course of the telemedicine visit and may receive copies of available information for a reasonable fee.  I understand that some of the potential risks of receiving the Services via telemedicine include:   Delay or interruption in medical evaluation due to technological equipment failure or disruption;  Information transmitted may not be sufficient (e.g. poor resolution of images) to allow for appropriate medical decision making by the Practitioner; and/or   In rare instances, security protocols could fail, causing a breach of personal health information.  Furthermore, I acknowledge that it is my responsibility to provide information about my medical history, conditions and care that is complete and accurate to the best of my ability. I acknowledge that Practitioner's advice, recommendations, and/or decision may be based on factors not within their control, such as incomplete or inaccurate data provided by me or distortions of diagnostic images or specimens that may result from electronic transmissions. I understand that the  practice of medicine is not an Chief Strategy Officer and that Practitioner makes no warranties or guarantees regarding treatment outcomes. I acknowledge that I will receive a copy of this consent concurrently upon execution via email to the email address I last provided but may also request a printed copy by calling the office of [facility].    I understand that my insurance will be billed for this visit.   I have read or had this consent read to me.  I understand the contents of this consent, which adequately explains the benefits and risks of the Services being provided via telemedicine.   I have been provided ample opportunity to ask questions regarding this consent and the Services and have had  my questions answered to my satisfaction.  I give my informed consent for the services to be provided through the use of telemedicine in my medical care  By participating in this telemedicine visit I agree to the above.

## 2019-05-16 ENCOUNTER — Telehealth: Payer: Medicare Other | Admitting: Cardiology

## 2019-07-02 DIAGNOSIS — M150 Primary generalized (osteo)arthritis: Secondary | ICD-10-CM

## 2019-07-02 DIAGNOSIS — M159 Polyosteoarthritis, unspecified: Secondary | ICD-10-CM

## 2019-07-02 DIAGNOSIS — M8949 Other hypertrophic osteoarthropathy, multiple sites: Secondary | ICD-10-CM

## 2019-07-02 HISTORY — DX: Other hypertrophic osteoarthropathy, multiple sites: M89.49

## 2019-07-02 HISTORY — DX: Primary generalized (osteo)arthritis: M15.0

## 2019-07-02 HISTORY — DX: Polyosteoarthritis, unspecified: M15.9

## 2019-11-25 DIAGNOSIS — S86919A Strain of unspecified muscle(s) and tendon(s) at lower leg level, unspecified leg, initial encounter: Secondary | ICD-10-CM

## 2019-11-25 HISTORY — DX: Strain of unspecified muscle(s) and tendon(s) at lower leg level, unspecified leg, initial encounter: S86.919A

## 2020-01-08 DIAGNOSIS — L209 Atopic dermatitis, unspecified: Secondary | ICD-10-CM

## 2020-01-08 DIAGNOSIS — I429 Cardiomyopathy, unspecified: Secondary | ICD-10-CM

## 2020-01-08 HISTORY — DX: Atopic dermatitis, unspecified: L20.9

## 2020-01-08 HISTORY — DX: Cardiomyopathy, unspecified: I42.9

## 2020-01-15 ENCOUNTER — Encounter: Payer: Self-pay | Admitting: Cardiology

## 2020-01-15 ENCOUNTER — Other Ambulatory Visit: Payer: Self-pay

## 2020-01-15 ENCOUNTER — Ambulatory Visit (INDEPENDENT_AMBULATORY_CARE_PROVIDER_SITE_OTHER): Payer: Medicare Other | Admitting: Cardiology

## 2020-01-15 VITALS — BP 122/60 | HR 75 | Ht 69.0 in | Wt 215.0 lb

## 2020-01-15 DIAGNOSIS — R0789 Other chest pain: Secondary | ICD-10-CM | POA: Diagnosis not present

## 2020-01-15 DIAGNOSIS — I452 Bifascicular block: Secondary | ICD-10-CM | POA: Diagnosis not present

## 2020-01-15 DIAGNOSIS — E1122 Type 2 diabetes mellitus with diabetic chronic kidney disease: Secondary | ICD-10-CM

## 2020-01-15 DIAGNOSIS — R002 Palpitations: Secondary | ICD-10-CM

## 2020-01-15 DIAGNOSIS — N183 Chronic kidney disease, stage 3 unspecified: Secondary | ICD-10-CM

## 2020-01-15 DIAGNOSIS — I10 Essential (primary) hypertension: Secondary | ICD-10-CM

## 2020-01-15 NOTE — Progress Notes (Signed)
Cardiology Office Note:    Date:  01/15/2020   ID:  Phillip Neal, DOB [birth year], MRN [id number]  PCP:  Phillip Mina., MD  Cardiologist:  Phillip Campus, MD    Referring MD: Phillip Mina., MD   No chief complaint on file. I had some strange episodes  History of Present Illness:    Phillip Neal is a 86 y.o. male with history of bifascicular block, also first-degree AV block, dyspnea on exertion, essential hypertension, GERD.  Also diabetic.  Comes today to my office for follow-up.  He described himself some episode that he said he had all his life and honestly he had difficult time to describe it there is something going on with his heart rhythm that happens sometimes at rest of course because he does have first-degree AV block and bifascicular block, worry about potentially having higher degree AV block and this is a manifestation of it.  He denies having any dizziness he denies having any passing out.  In spite of his age he still very active he works a lot he will have to work in the garden does a lot of work there and have no difficulty doing it.  Past Medical History:  Diagnosis Date  . Arthritis    oa bo hips  . Cancer ([place])    squamous cell removed 3 yrs ago from upper chest by neck  . Cough last week   no phlegm jill dr Wynelle Link pa aware  . Diabetes mellitus without complication ([place])   . History of kidney stones    years ago, passed on own  . Hyperlipidemia   . Hypertension   . Vertigo    jan  2019 surgery rescheduled ent dr Phillip Neal [place] no vertigo since end of january    Past Surgical History:  Procedure Laterality Date  . colonscopy     with polyp removal  . HERNIA REPAIR  1954   left inguinal hernia  . TOTAL HIP ARTHROPLASTY Left 01/31/2018   Procedure: LEFT TOTAL HIP ARTHROPLASTY ANTERIOR APPROACH;  Surgeon: Phillip Arabian, MD;  Location: [facility];  Service: Orthopedics;  Laterality: Left;  . TOTAL HIP ARTHROPLASTY Right 05/16/2018   Procedure:  RIGHT TOTAL HIP ARTHROPLASTY ANTERIOR APPROACH;  Surgeon: Phillip Arabian, MD;  Location: [facility];  Service: Orthopedics;  Laterality: Right;    Current Medications: Current Meds  Medication Sig  . aspirin EC 81 MG tablet Take 81 mg by mouth daily.  . clotrimazole-betamethasone (LOTRISONE) cream Apply 1 application topically 2 (two) times daily as needed (for groin rash/itch).   . Coenzyme Q10 (CO Q 10) 100 MG CAPS Take 100 mg by mouth 2 (two) times daily.  . fluticasone (FLONASE) 50 MCG/ACT nasal spray Place 2 sprays into both nostrils daily as needed for allergies or rhinitis.  . hydrochlorothiazide (HYDRODIURIL) 12.5 MG tablet Take 12.5 mg by mouth daily.   Marland Kitchen lisinopril (PRINIVIL,ZESTRIL) 40 MG tablet Take 40 mg by mouth daily.   . metFORMIN (GLUCOPHAGE-XR) 500 MG 24 hr tablet Take 1,000 mg by mouth 2 (two) times daily.   . vitamin B-12 (CYANOCOBALAMIN) 1000 MCG tablet Take 1,000 mcg by mouth daily.     Allergies:   Ciprofloxacin, Other, Statins, and Sulfamethoxazole   Social History   Socioeconomic History  . Marital status: Married    Spouse name: Not on file  . Number of children: Not on file  . Years of education: Not on file  . Highest education level: Not on file  Occupational  History  . Not on file  Tobacco Use  . Smoking status: Never Smoker  . Smokeless tobacco: Never Used  Substance and Sexual Activity  . Alcohol use: Never  . Drug use: Never  . Sexual activity: Not on file  Other Topics Concern  . Not on file  Social History Narrative  . Not on file   Social Determinants of Health   Financial Resource Strain:   . Difficulty of Paying Living Expenses: Not on file  Food Insecurity:   . Worried About Charity fundraiser in the Last Year: Not on file  . Ran Out of Food in the Last Year: Not on file  Transportation Needs:   . Lack of Transportation (Medical): Not on file  . Lack of Transportation (Non-Medical): Not on file  Physical Activity:   . Days of  Exercise per Week: Not on file  . Minutes of Exercise per Session: Not on file  Stress:   . Feeling of Stress : Not on file  Social Connections:   . Frequency of Communication with Friends and Family: Not on file  . Frequency of Social Gatherings with Friends and Family: Not on file  . Attends Religious Services: Not on file  . Active Member of Clubs or Organizations: Not on file  . Attends Archivist Meetings: Not on file  . Marital Status: Not on file     Family History: [name] family history includes Heart failure in his mother. ROS:   Please see the history of present illness.    All 14 point review of systems negative except as described per history of present illness  EKGs/Labs/Other Studies Reviewed:      Recent Labs: No results found for requested labs within last 8760 hours.  Recent Lipid Panel No results found for: CHOL, TRIG, HDL, CHOLHDL, VLDL, LDLCALC, LDLDIRECT  Physical Exam:    VS:  BP 122/60   Pulse 75   Ht 5\' 9"  (1.753 m)   Wt 215 lb (97.5 kg)   SpO2 97%   BMI 31.75 kg/m     Wt Readings from Last 3 Encounters:  01/15/20 215 lb (97.5 kg)  02/13/19 225 lb 6.4 oz (102.2 kg)  08/28/18 224 lb 6.4 oz (101.8 kg)     GEN:  Well nourished, well developed in no acute distress HEENT: Normal NECK: No JVD; No carotid bruits LYMPHATICS: No lymphadenopathy CARDIAC: RRR, no murmurs, no rubs, no gallops RESPIRATORY:  Clear to auscultation without rales, wheezing or rhonchi  ABDOMEN: Soft, non-tender, non-distended MUSCULOSKELETAL:  No edema; No deformity  SKIN: Warm and dry LOWER EXTREMITIES: no swelling NEUROLOGIC:  Alert and oriented x 3 PSYCHIATRIC:  Normal affect   ASSESSMENT:    1. Bifascicular bundle branch block   2. Essential hypertension   3. Diabetes mellitus with stage 3 chronic kidney disease ([place])   4. Atypical chest pain    PLAN:    In order of problems listed above:  1. Bifascicular bundle branch block.  I will ask  him to wear Zio patch to make sure that he does not have any more advanced heart block.  Obviously will avoid any medication but will suppress conduction. 2. Essential hypertension blood pressure well controlled continue present management. 3. Diabetes: Apparently stable followed by internal medicine team. 4. Atypical chest pain denies having any.  Prior stress test was negative. 5. Dyspnea on exertion I will ask you to have an echocardiogram to assess left ventricle ejection fraction.  I  wonder about his conduction system.  Some episode that he described looks somewhat worrisome.  At the same time, he tells me that he has been getting this episode for all his life.  Still he does have to have monitoring as well as echocardiogram.   Medication Adjustments/Labs and Tests Ordered: Current medicines are reviewed at length with the patient today.  Concerns regarding medicines are outlined above.  No orders of the defined types were placed in this encounter.  Medication changes: No orders of the defined types were placed in this encounter.   Signed, Park Liter, MD, [facility] 01/15/2020 9:23 AM    [place]

## 2020-01-15 NOTE — Addendum Note (Signed)
Addended by: Ashok Norris on: 01/15/2020 09:31 AM   Modules accepted: Orders

## 2020-01-15 NOTE — Patient Instructions (Signed)
Medication Instructions:  Your physician recommends that you continue on your current medications as directed. Please refer to the Current Medication list given to you today.  *If you need a refill on your cardiac medications before your next appointment, please call your pharmacy*  Lab Work: None.  If you have labs (blood work) drawn today and your tests are completely normal, you will receive your results only by: Marland Kitchen MyChart Message (if you have MyChart) OR . A paper copy in the mail If you have any lab test that is abnormal or we need to change your treatment, we will call you to review the results.  Testing/Procedures: Your physician has requested that you have an echocardiogram. Echocardiography is a painless test that uses sound waves to create images of your heart. It provides your doctor with information about the size and shape of your heart and how well your heart's chambers and valves are working. This procedure takes approximately one hour. There are no restrictions for this procedure.  Your physician has recommended that you wear a holter monitor. Holter monitors are medical devices that record the heart's electrical activity. Doctors most often use these monitors to diagnose arrhythmias. Arrhythmias are problems with the speed or rhythm of the heartbeat. The monitor is a small, portable device. You can wear one while you do your normal daily activities. This is usually used to diagnose what is causing palpitations/syncope (passing out). Wear for 7 days     Follow-Up: At [facility], you and your health needs are our priority.  As part of our continuing mission to provide you with exceptional heart care, we have created designated Provider Care Teams.  These Care Teams include your primary Cardiologist (physician) and Advanced Practice Providers (APPs -  Physician Assistants and Nurse Practitioners) who all work together to provide you with the care you need, when you need  it.  Your next appointment:   6 week(s)  The format for your next appointment:   In Person  Provider:   Jenne Campus, MD  Other Instructions   Echocardiogram An echocardiogram is a procedure that uses painless sound waves (ultrasound) to produce an image of the heart. Images from an echocardiogram can provide important information about:  Signs of coronary artery disease (CAD).  Aneurysm detection. An aneurysm is a weak or damaged part of an artery wall that bulges out from the normal force of blood pumping through the body.  Heart size and shape. Changes in the size or shape of the heart can be associated with certain conditions, including heart failure, aneurysm, and CAD.  Heart muscle function.  Heart valve function.  Signs of a past heart attack.  Fluid buildup around the heart.  Thickening of the heart muscle.  A tumor or infectious growth around the heart valves. Tell a health care provider about:  Any allergies you have.  All medicines you are taking, including vitamins, herbs, eye drops, creams, and over-the-counter medicines.  Any blood disorders you have.  Any surgeries you have had.  Any medical conditions you have.  Whether you are pregnant or may be pregnant. What are the risks? Generally, this is a safe procedure. However, problems may occur, including:  Allergic reaction to dye (contrast) that may be used during the procedure. What happens before the procedure? No specific preparation is needed. You may eat and drink normally. What happens during the procedure?   An IV tube may be inserted into one of your veins.  You may receive contrast  through this tube. A contrast is an injection that improves the quality of the pictures from your heart.  A gel will be applied to your chest.  A wand-like tool (transducer) will be moved over your chest. The gel will help to transmit the sound waves from the transducer.  The sound waves will  harmlessly bounce off of your heart to allow the heart images to be captured in real-time motion. The images will be recorded on a computer. The procedure may vary among health care providers and hospitals. What happens after the procedure?  You may return to your normal, everyday life, including diet, activities, and medicines, unless your health care provider tells you not to do that. Summary  An echocardiogram is a procedure that uses painless sound waves (ultrasound) to produce an image of the heart.  Images from an echocardiogram can provide important information about the size and shape of your heart, heart muscle function, heart valve function, and fluid buildup around your heart.  You do not need to do anything to prepare before this procedure. You may eat and drink normally.  After the echocardiogram is completed, you may return to your normal, everyday life, unless your health care provider tells you not to do that. This information is not intended to replace advice given to you by your health care provider. Make sure you discuss any questions you have with your health care provider. Document Revised: 03/07/2019 Document Reviewed: 12/17/2016 Elsevier Patient Education  [place].

## 2020-01-21 ENCOUNTER — Ambulatory Visit (INDEPENDENT_AMBULATORY_CARE_PROVIDER_SITE_OTHER): Payer: Medicare Other

## 2020-01-21 DIAGNOSIS — R002 Palpitations: Secondary | ICD-10-CM | POA: Diagnosis not present

## 2020-02-28 ENCOUNTER — Telehealth: Payer: Self-pay | Admitting: Emergency Medicine

## 2020-02-28 NOTE — Telephone Encounter (Signed)
Left message for patient to return call regarding results  

## 2020-03-02 ENCOUNTER — Ambulatory Visit (INDEPENDENT_AMBULATORY_CARE_PROVIDER_SITE_OTHER): Payer: Medicare Other | Admitting: Cardiology

## 2020-03-02 ENCOUNTER — Ambulatory Visit (INDEPENDENT_AMBULATORY_CARE_PROVIDER_SITE_OTHER): Payer: Medicare Other

## 2020-03-02 ENCOUNTER — Encounter: Payer: Self-pay | Admitting: Cardiology

## 2020-03-02 ENCOUNTER — Other Ambulatory Visit: Payer: Self-pay

## 2020-03-02 VITALS — BP 154/68 | Ht 69.0 in

## 2020-03-02 DIAGNOSIS — R002 Palpitations: Secondary | ICD-10-CM | POA: Diagnosis not present

## 2020-03-02 DIAGNOSIS — R0789 Other chest pain: Secondary | ICD-10-CM | POA: Diagnosis not present

## 2020-03-02 DIAGNOSIS — E1122 Type 2 diabetes mellitus with diabetic chronic kidney disease: Secondary | ICD-10-CM

## 2020-03-02 DIAGNOSIS — I452 Bifascicular block: Secondary | ICD-10-CM

## 2020-03-02 DIAGNOSIS — N183 Chronic kidney disease, stage 3 unspecified: Secondary | ICD-10-CM

## 2020-03-02 DIAGNOSIS — E782 Mixed hyperlipidemia: Secondary | ICD-10-CM | POA: Diagnosis not present

## 2020-03-02 DIAGNOSIS — I10 Essential (primary) hypertension: Secondary | ICD-10-CM

## 2020-03-02 NOTE — Progress Notes (Signed)
Complete echocardiogram has been performed.  Jimmy Lynea Rollison RDCS, RVT 

## 2020-03-02 NOTE — Progress Notes (Signed)
Cardiology Office Note:    Date:  03/02/2020   ID:  Phillip Neal, DOB 10/17/1931, MRN [id number]  PCP:  Raina Mina., MD  Cardiologist:  Jenne Campus, MD    Referring MD: Raina Mina., MD   No chief complaint on file. M doing better  History of Present Illness:    Phillip Neal is a 86 y.o. male with past medical history significant for bifascicular block, please review the block.  Dyspnea on exertion, essential hypertension, GERD, diabetes.  Been following him for his problems.  He described to have sensation that sometimes he does have difficulty breathing yesterday could be corrected got his breath.  There was also concern about potentially having more advanced conduction disease but we were not able to identify it on single simple EKG.  He did wear Zio patch Zio patch showed some symptomatic PVCs, but no more advanced conduction disease problem.  We had a long discussion about what to do with the situation he did have echocardiogram done earlier today we will wait for results of the echocardiogram.  I have feeling that we will end up doing a stress test on him. Overall however he seems to be doing quite well he is very busy working regarding he does something every day.  He does have more than 200 azelias that he is taking care of.  Past Medical History:  Diagnosis Date  . Arthritis    oa bo hips  . Cancer ([place])    squamous cell removed 3 yrs ago from upper chest by neck  . Cough last week   no phlegm jill dr Wynelle Link pa aware  . Diabetes mellitus without complication ([place])   . History of kidney stones    years ago, passed on own  . Hyperlipidemia   . Hypertension   . Vertigo    jan  2019 surgery rescheduled ent dr Laurance Flatten [place] no vertigo since end of january    Past Surgical History:  Procedure Laterality Date  . colonscopy     with polyp removal  . HERNIA REPAIR  1954   left inguinal hernia  . TOTAL HIP ARTHROPLASTY Left 01/31/2018   Procedure:  LEFT TOTAL HIP ARTHROPLASTY ANTERIOR APPROACH;  Surgeon: Gaynelle Arabian, MD;  [facility];  Service: Orthopedics;  Laterality: Left;  . TOTAL HIP ARTHROPLASTY Right 05/16/2018   Procedure: RIGHT TOTAL HIP ARTHROPLASTY ANTERIOR APPROACH;  Surgeon: Gaynelle Arabian, MD;  Location[facility];  Service: Orthopedics;  Laterality: Right;    Current Medications: Current Meds  Medication Sig  . aspirin EC 81 MG tablet Take 81 mg by mouth daily.  . clotrimazole-betamethasone (LOTRISONE) cream Apply 1 application topically 2 (two) times daily as needed (for groin rash/itch).   . Coenzyme Q10 (CO Q 10) 100 MG CAPS Take 100 mg by mouth 2 (two) times daily.  Marland Kitchen ezetimibe (ZETIA) 10 MG tablet Take 1 tablet (10 mg total) by mouth daily.  . fluticasone (FLONASE) 50 MCG/ACT nasal spray Place 2 sprays into both nostrils daily as needed for allergies or rhinitis.  . hydrochlorothiazide (HYDRODIURIL) 12.5 MG tablet Take 12.5 mg by mouth daily.   Marland Kitchen lisinopril (PRINIVIL,ZESTRIL) 40 MG tablet Take 40 mg by mouth daily.   . metFORMIN (GLUCOPHAGE-XR) 500 MG 24 hr tablet Take 1,000 mg by mouth 2 (two) times daily.   . vitamin B-12 (CYANOCOBALAMIN) 1000 MCG tablet Take 1,000 mcg by mouth daily.     Allergies:   Ciprofloxacin, Other, Statins, and Sulfamethoxazole  Social History   Socioeconomic History  . Marital status: Married    Spouse name: Not on file  . Number of children: Not on file  . Years of education: Not on file  . Highest education level: Not on file  Occupational History  . Not on file  Tobacco Use  . Smoking status: Never Smoker  . Smokeless tobacco: Never Used  Substance and Sexual Activity  . Alcohol use: Never  . Drug use: Never  . Sexual activity: Not on file  Other Topics Concern  . Not on file  Social History Narrative  . Not on file   Social Determinants of Health   Financial Resource Strain:   . Difficulty of Paying Living Expenses:   Food Insecurity:   . Worried About  Charity fundraiser in the Last Year:   . Arboriculturist in the Last Year:   Transportation Needs:   . Film/video editor (Medical):   Marland Kitchen Lack of Transportation (Non-Medical):   Physical Activity:   . Days of Exercise per Week:   . Minutes of Exercise per Session:   Stress:   . Feeling of Stress :   Social Connections:   . Frequency of Communication with Friends and Family:   . Frequency of Social Gatherings with Friends and Family:   . Attends Religious Services:   . Active Member of Clubs or Organizations:   . Attends Archivist Meetings:   Marland Kitchen Marital Status:      Family History: [name] family history includes Heart failure in his mother. ROS:   Please see the history of present illness.    All 14 point review of systems negative except as described per history of present illness  EKGs/Labs/Other Studies Reviewed:      Recent Labs: No results found for requested labs within last 8760 hours.  Recent Lipid Panel No results found for: CHOL, TRIG, HDL, CHOLHDL, VLDL, LDLCALC, LDLDIRECT  Physical Exam:    VS:  BP (!) 154/68   Ht 5\' 9"  (1.753 m)   BMI 31.75 kg/m     Wt Readings from Last 3 Encounters:  01/15/20 215 lb (97.5 kg)  02/13/19 225 lb 6.4 oz (102.2 kg)  08/28/18 224 lb 6.4 oz (101.8 kg)     GEN:  Well nourished, well developed in no acute distress HEENT: Normal NECK: No JVD; No carotid bruits LYMPHATICS: No lymphadenopathy CARDIAC: RRR, no murmurs, no rubs, no gallops RESPIRATORY:  Clear to auscultation without rales, wheezing or rhonchi  ABDOMEN: Soft, non-tender, non-distended MUSCULOSKELETAL:  No edema; No deformity  SKIN: Warm and dry LOWER EXTREMITIES: no swelling NEUROLOGIC:  Alert and oriented x 3 PSYCHIATRIC:  Normal affect   ASSESSMENT:    1. Bifascicular bundle branch block   2. Essential hypertension   3. Diabetes mellitus with stage 3 chronic kidney disease ([place])   4. Mixed hyperlipidemia   5. Atypical chest pain     PLAN:    In order of problems listed above:  1. Bifascicular branch block. 2. No worsening of baseline Zio patch at work for a week.  We will continue monitoring. 3. Essential hypertension his blood pressure is controlled today slightly elevated systolic but when he checked it at home it is always good. 4. Diabetes stable 5. Mixed dyslipidemia he is taking only Zetia.  I do have K PN but there is no fasting lipid profile.  Will call his primary care physician to get it. 6. Atypical chest  pain. 7. Doing well after doing a stress test but I will wait for results of echocardiogram first   Medication Adjustments/Labs and Tests Ordered: Current medicines are reviewed at length with the patient today.  Concerns regarding medicines are outlined above.  No orders of the defined types were placed in this encounter.  Medication changes: No orders of the defined types were placed in this encounter.   Signed, Park Liter, MD, [facility] 03/02/2020 11:06 AM    [place]

## 2020-03-02 NOTE — Patient Instructions (Signed)
Medication Instructions:  Your physician recommends that you continue on your current medications as directed. Please refer to the Current Medication list given to you today.  *If you need a refill on your cardiac medications before your next appointment, please call your pharmacy*   Lab Work: None If you have labs (blood work) drawn today and your tests are completely normal, you will receive your results only by: MyChart Message (if you have MyChart) OR A paper copy in the mail If you have any lab test that is abnormal or we need to change your treatment, we will call you to review the results.   Testing/Procedures: None   Follow-Up: At [facility], you and your health needs are our priority.  As part of our continuing mission to provide you with exceptional heart care, we have created designated Provider Care Teams.  These Care Teams include your primary Cardiologist (physician) and Advanced Practice Providers (APPs -  Physician Assistants and Nurse Practitioners) who all work together to provide you with the care you need, when you need it.  We recommend signing up for the patient portal called "MyChart".  Sign up information is provided on this After Visit Summary.  MyChart is used to connect with patients for Virtual Visits (Telemedicine).  Patients are able to view lab/test results, encounter notes, upcoming appointments, etc.  Non-urgent messages can be sent to your provider as well.   To learn more about what you can do with MyChart, go to [email or web address].    Your next appointment:   1 month(s)  The format for your next appointment:   In Person  Provider:   Robert Krasowski, MD   Other Instructions   

## 2020-03-04 ENCOUNTER — Telehealth: Payer: Self-pay | Admitting: Emergency Medicine

## 2020-03-04 DIAGNOSIS — R0609 Other forms of dyspnea: Secondary | ICD-10-CM

## 2020-03-04 NOTE — Telephone Encounter (Signed)
Left message for patient to return call regarding [place] appointment.

## 2020-03-04 NOTE — Telephone Encounter (Signed)
Called patient informed him of echocardiogram results. Advised him Dr. Agustin Cree now wanted a lexiscan. I will have him scheduled and call back with appointment date and time and instructions. Patient verbally understood no further questions.

## 2020-03-06 NOTE — Telephone Encounter (Signed)
Called patient back informed him of his [place] appointment and went over instructions. He was advised to hold metformin the day of his nuclear test. He verbally understood no further questions.

## 2020-03-12 ENCOUNTER — Telehealth (HOSPITAL_COMMUNITY): Payer: Self-pay | Admitting: *Deleted

## 2020-03-12 NOTE — Telephone Encounter (Signed)
Patient given detailed instructions per Myocardial Perfusion Study Information Sheet for the test on 03/19/20 at 7:45. Patient notified to arrive 15 minutes early and that it is imperative to arrive on time for appointment to keep from having the test rescheduled.  If you need to cancel or reschedule your appointment, please call the office within 24 hours of your appointment. . Patient verbalized understanding.Phillip Neal

## 2020-03-19 ENCOUNTER — Ambulatory Visit (INDEPENDENT_AMBULATORY_CARE_PROVIDER_SITE_OTHER): Payer: Medicare Other

## 2020-03-19 ENCOUNTER — Other Ambulatory Visit: Payer: Self-pay

## 2020-03-19 VITALS — Ht 69.0 in | Wt 215.0 lb

## 2020-03-19 DIAGNOSIS — R0600 Dyspnea, unspecified: Secondary | ICD-10-CM

## 2020-03-19 DIAGNOSIS — R0609 Other forms of dyspnea: Secondary | ICD-10-CM

## 2020-03-19 LAB — MYOCARDIAL PERFUSION IMAGING
LV dias vol: 107 mL (ref 62–150)
LV sys vol: 49 mL
Peak HR: 75 {beats}/min
Rest HR: 55 {beats}/min
SDS: 0
SRS: 12
SSS: 12
TID: 1.05

## 2020-03-19 MED ORDER — TECHNETIUM TC 99M TETROFOSMIN IV KIT
32.0000 | PACK | Freq: Once | INTRAVENOUS | Status: AC | PRN
  Administered 2020-03-19: 32 via INTRAVENOUS

## 2020-03-19 MED ORDER — REGADENOSON 0.4 MG/5ML IV SOLN
0.4000 mg | Freq: Once | INTRAVENOUS | Status: AC
  Administered 2020-03-19: 0.4 mg via INTRAVENOUS

## 2020-03-19 MED ORDER — TECHNETIUM TC 99M TETROFOSMIN IV KIT
9.9000 | PACK | Freq: Once | INTRAVENOUS | Status: AC | PRN
  Administered 2020-03-19: 9.9 via INTRAVENOUS

## 2020-04-01 ENCOUNTER — Ambulatory Visit (INDEPENDENT_AMBULATORY_CARE_PROVIDER_SITE_OTHER): Payer: Medicare Other | Admitting: Cardiology

## 2020-04-01 ENCOUNTER — Encounter: Payer: Self-pay | Admitting: Cardiology

## 2020-04-01 ENCOUNTER — Other Ambulatory Visit: Payer: Self-pay

## 2020-04-01 VITALS — BP 146/66 | HR 71 | Ht 69.0 in | Wt 221.0 lb

## 2020-04-01 DIAGNOSIS — Z77090 Contact with and (suspected) exposure to asbestos: Secondary | ICD-10-CM | POA: Insufficient documentation

## 2020-04-01 DIAGNOSIS — R0789 Other chest pain: Secondary | ICD-10-CM | POA: Diagnosis not present

## 2020-04-01 DIAGNOSIS — I452 Bifascicular block: Secondary | ICD-10-CM

## 2020-04-01 DIAGNOSIS — E782 Mixed hyperlipidemia: Secondary | ICD-10-CM | POA: Diagnosis not present

## 2020-04-01 DIAGNOSIS — R0600 Dyspnea, unspecified: Secondary | ICD-10-CM

## 2020-04-01 DIAGNOSIS — R0609 Other forms of dyspnea: Secondary | ICD-10-CM

## 2020-04-01 DIAGNOSIS — I10 Essential (primary) hypertension: Secondary | ICD-10-CM | POA: Diagnosis not present

## 2020-04-01 HISTORY — DX: Dyspnea, unspecified: R06.00

## 2020-04-01 HISTORY — DX: Contact with and (suspected) exposure to asbestos: Z77.090

## 2020-04-01 HISTORY — DX: Other forms of dyspnea: R06.09

## 2020-04-01 NOTE — Patient Instructions (Signed)
Medication Instructions:  Your physician recommends that you continue on your current medications as directed. Please refer to the Current Medication list given to you today.  *If you need a refill on your cardiac medications before your next appointment, please call your pharmacy*   Lab Work: None  If you have labs (blood work) drawn today and your tests are completely normal, you will receive your results only by: Marland Kitchen MyChart Message (if you have MyChart) OR . A paper copy in the mail If you have any lab test that is abnormal or we need to change your treatment, we will call you to review the results.   Testing/Procedures: A chest x-ray takes a picture of the organs and structures inside the chest, including the heart, lungs, and blood vessels. This test can show several things, including, whether the heart is enlarges; whether fluid is building up in the lungs; and whether pacemaker / defibrillator leads are still in place.    Follow-Up: At [facility], you and your health needs are our priority.  As part of our continuing mission to provide you with exceptional heart care, we have created designated Provider Care Teams.  These Care Teams include your primary Cardiologist (physician) and Advanced Practice Providers (APPs -  Physician Assistants and Nurse Practitioners) who all work together to provide you with the care you need, when you need it.  We recommend signing up for the patient portal called "MyChart".  Sign up information is provided on this After Visit Summary.  MyChart is used to connect with patients for Virtual Visits (Telemedicine).  Patients are able to view lab/test results, encounter notes, upcoming appointments, etc.  Non-urgent messages can be sent to your provider as well.   To learn more about what you can do with MyChart, go to [email or web address].    Your next appointment:   6 month(s)  The format for your next appointment:   In Person  Provider:    Jenne Campus, MD   Other Instructions

## 2020-04-01 NOTE — Progress Notes (Signed)
Cardiology Office Note:    Date:  04/01/2020   ID:  Phillip Neal, DOB [birth year], MRN [id number]  PCP:  Raina Mina., MD  Cardiologist:  Jenne Campus, MD    Referring MD: Raina Mina., MD   No chief complaint on file. Doing the same still does some exertional shortness of breath  History of Present Illness:    Phillip Neal is a 86 y.o. male with past medical history significant for bifascicular block.  Recently he started complaining of having some dyspnea on exertion his past medical history is also significant for essential hypertension diabetes GERD dyslipidemia, asbestos exposure.  I did quite extensive cardiac evaluation trying to find potential cardiac reason for his dyspnea on exertion.  Luckily work-up came out negative.  Zio patch did not show any significant worsening of his bifascicular block, there were no high degree AV block, echocardiogram revealed normal left ventricle ejection fraction, grade 1 diastolic dysfunctions, no significant valvular pathology identified.  Stress test done in April showed no evidence of ischemia.  Overall I do not see any cardiac reason for his dyspnea on exertion longer conversation with him reviewing the fact that this is the problem that she is having for 40 years but lately slightly worse.  He is very busy working in his garden when he does a lot of work.  He got about 200 azalias that he take care of.  Past Medical History:  Diagnosis Date  . Arthritis    oa bo hips  . Cancer ([place])    squamous cell removed 3 yrs ago from upper chest by neck  . Cough last week   no phlegm jill dr Wynelle Link pa aware  . Diabetes mellitus without complication ([place])   . History of kidney stones    years ago, passed on own  . Hyperlipidemia   . Hypertension   . Vertigo    jan  2019 surgery rescheduled ent dr Laurance Flatten [place] no vertigo since end of january    Past Surgical History:  Procedure Laterality Date  . colonscopy     with polyp  removal  . HERNIA REPAIR  1954   left inguinal hernia  . TOTAL HIP ARTHROPLASTY Left 01/31/2018   Procedure: LEFT TOTAL HIP ARTHROPLASTY ANTERIOR APPROACH;  Surgeon: Gaynelle Arabian, MD;  Location: [facility];  Service: Orthopedics;  Laterality: Left;  . TOTAL HIP ARTHROPLASTY Right 05/16/2018   Procedure: RIGHT TOTAL HIP ARTHROPLASTY ANTERIOR APPROACH;  Surgeon: Gaynelle Arabian, MD;  Location: [facility];  Service: Orthopedics;  Laterality: Right;    Current Medications: Current Meds  Medication Sig  . aspirin EC 81 MG tablet Take 81 mg by mouth daily.  . clotrimazole-betamethasone (LOTRISONE) cream Apply 1 application topically 2 (two) times daily as needed (for groin rash/itch).   . Coenzyme Q10 (CO Q 10) 100 MG CAPS Take 100 mg by mouth 2 (two) times daily.  Marland Kitchen ezetimibe (ZETIA) 10 MG tablet Take 1 tablet (10 mg total) by mouth daily.  . fluticasone (FLONASE) 50 MCG/ACT nasal spray Place 2 sprays into both nostrils daily as needed for allergies or rhinitis.  . hydrochlorothiazide (HYDRODIURIL) 12.5 MG tablet Take 12.5 mg by mouth daily.   Marland Kitchen lisinopril (PRINIVIL,ZESTRIL) 40 MG tablet Take 40 mg by mouth daily.   . metFORMIN (GLUCOPHAGE-XR) 500 MG 24 hr tablet Take 1,000 mg by mouth 2 (two) times daily.   . vitamin B-12 (CYANOCOBALAMIN) 1000 MCG tablet Take 1,000 mcg by mouth daily.     Allergies:  Ciprofloxacin, Other, Statins, and Sulfamethoxazole   Social History   Socioeconomic History  . Marital status: Married    Spouse name: Not on file  . Number of children: Not on file  . Years of education: Not on file  . Highest education level: Not on file  Occupational History  . Not on file  Tobacco Use  . Smoking status: Never Smoker  . Smokeless tobacco: Never Used  Substance and Sexual Activity  . Alcohol use: Never  . Drug use: Never  . Sexual activity: Not on file  Other Topics Concern  . Not on file  Social History Narrative  . Not on file   Social Determinants of Health    Financial Resource Strain:   . Difficulty of Paying Living Expenses:   Food Insecurity:   . Worried About Charity fundraiser in the Last Year:   . Arboriculturist in the Last Year:   Transportation Needs:   . Film/video editor (Medical):   Marland Kitchen Lack of Transportation (Non-Medical):   Physical Activity:   . Days of Exercise per Week:   . Minutes of Exercise per Session:   Stress:   . Feeling of Stress :   Social Connections:   . Frequency of Communication with Friends and Family:   . Frequency of Social Gatherings with Friends and Family:   . Attends Religious Services:   . Active Member of Clubs or Organizations:   . Attends Archivist Meetings:   Marland Kitchen Marital Status:      Family History: [name] family history includes Heart failure in his mother. ROS:   Please see the history of present illness.    All 14 point review of systems negative except as described per history of present illness  EKGs/Labs/Other Studies Reviewed:      Recent Labs: No results found for requested labs within last 8760 hours.  Recent Lipid Panel No results found for: CHOL, TRIG, HDL, CHOLHDL, VLDL, LDLCALC, LDLDIRECT  Physical Exam:    VS:  BP (!) 146/66   Pulse 71   Ht 5\' 9"  (1.753 m)   Wt 221 lb (100.2 kg)   SpO2 98%   BMI 32.64 kg/m     Wt Readings from Last 3 Encounters:  04/01/20 221 lb (100.2 kg)  03/19/20 215 lb (97.5 kg)  01/15/20 215 lb (97.5 kg)     GEN:  Well nourished, well developed in no acute distress HEENT: Normal NECK: No JVD; No carotid bruits LYMPHATICS: No lymphadenopathy CARDIAC: RRR, no murmurs, no rubs, no gallops RESPIRATORY:  Clear to auscultation without rales, wheezing or rhonchi  ABDOMEN: Soft, non-tender, non-distended MUSCULOSKELETAL:  No edema; No deformity  SKIN: Warm and dry LOWER EXTREMITIES: no swelling NEUROLOGIC:  Alert and oriented x 3 PSYCHIATRIC:  Normal affect   ASSESSMENT:    1. Bifascicular bundle branch block    2. Essential hypertension   3. Atypical chest pain   4. Mixed hyperlipidemia   5. Dyspnea on exertion    PLAN:    In order of problems listed above:  1. Bifascicular block.  Zio patch did not show any advanced heart block.  We will continue conservative approach. 2. Essential hypertension his blood pressure is always slightly elevated while in the office but at home is good.  We will continue present management. 3. Atypical chest pain all cardiac testing has been negative family reviewed with the patient. 4. Dyslipidemia he is taking Zetia, I did review laboratory  test from primary care physician showing LDL of 74 and HDL 46.  We will continue present management. 5. Dyspnea on exertion with cardiac work-up negative.  He does have exposure to asbestos.  I think it will be reasonable at least to do a chest x-ray to rule out any possibility of calcification that can be related to asbestos exposure.  In the future he may benefit from seeing pulmonary doctor.   Medication Adjustments/Labs and Tests Ordered: Current medicines are reviewed at length with the patient today.  Concerns regarding medicines are outlined above.  No orders of the defined types were placed in this encounter.  Medication changes: No orders of the defined types were placed in this encounter.   Signed, Park Liter, MD, [facility] 04/01/2020 9:20 AM    [place]

## 2020-12-31 DIAGNOSIS — I10 Essential (primary) hypertension: Secondary | ICD-10-CM | POA: Insufficient documentation

## 2020-12-31 DIAGNOSIS — E119 Type 2 diabetes mellitus without complications: Secondary | ICD-10-CM | POA: Insufficient documentation

## 2020-12-31 DIAGNOSIS — E785 Hyperlipidemia, unspecified: Secondary | ICD-10-CM | POA: Insufficient documentation

## 2020-12-31 DIAGNOSIS — Z87442 Personal history of urinary calculi: Secondary | ICD-10-CM | POA: Insufficient documentation

## 2020-12-31 DIAGNOSIS — C801 Malignant (primary) neoplasm, unspecified: Secondary | ICD-10-CM | POA: Insufficient documentation

## 2020-12-31 DIAGNOSIS — M1990 Unspecified osteoarthritis, unspecified site: Secondary | ICD-10-CM | POA: Insufficient documentation

## 2020-12-31 DIAGNOSIS — R42 Dizziness and giddiness: Secondary | ICD-10-CM | POA: Insufficient documentation

## 2020-12-31 DIAGNOSIS — R059 Cough, unspecified: Secondary | ICD-10-CM | POA: Insufficient documentation

## 2021-01-01 ENCOUNTER — Other Ambulatory Visit: Payer: Self-pay

## 2021-01-01 ENCOUNTER — Encounter: Payer: Self-pay | Admitting: Cardiology

## 2021-01-01 ENCOUNTER — Ambulatory Visit (INDEPENDENT_AMBULATORY_CARE_PROVIDER_SITE_OTHER): Payer: Medicare Other

## 2021-01-01 ENCOUNTER — Ambulatory Visit (INDEPENDENT_AMBULATORY_CARE_PROVIDER_SITE_OTHER): Payer: Medicare Other | Admitting: Cardiology

## 2021-01-01 VITALS — BP 136/88 | HR 67 | Ht 69.0 in | Wt 224.0 lb

## 2021-01-01 DIAGNOSIS — R55 Syncope and collapse: Secondary | ICD-10-CM | POA: Insufficient documentation

## 2021-01-01 DIAGNOSIS — E1122 Type 2 diabetes mellitus with diabetic chronic kidney disease: Secondary | ICD-10-CM

## 2021-01-01 DIAGNOSIS — I10 Essential (primary) hypertension: Secondary | ICD-10-CM | POA: Diagnosis not present

## 2021-01-01 DIAGNOSIS — R0609 Other forms of dyspnea: Secondary | ICD-10-CM

## 2021-01-01 DIAGNOSIS — R0600 Dyspnea, unspecified: Secondary | ICD-10-CM | POA: Diagnosis not present

## 2021-01-01 DIAGNOSIS — I452 Bifascicular block: Secondary | ICD-10-CM | POA: Diagnosis not present

## 2021-01-01 DIAGNOSIS — E782 Mixed hyperlipidemia: Secondary | ICD-10-CM

## 2021-01-01 DIAGNOSIS — N183 Chronic kidney disease, stage 3 unspecified: Secondary | ICD-10-CM

## 2021-01-01 HISTORY — DX: Syncope and collapse: R55

## 2021-01-01 NOTE — Addendum Note (Signed)
Addended by: Orvan July on: 01/01/2021 12:12 PM   Modules accepted: Orders

## 2021-01-01 NOTE — Patient Instructions (Addendum)
Medication Instructions:  Your physician recommends that you continue on your current medications as directed. Please refer to the Current Medication list given to you today.  *If you need a refill on your cardiac medications before your next appointment, please call your pharmacy*   Lab Work: None If you have labs (blood work) drawn today and your tests are completely normal, you will receive your results only by: Marland Kitchen MyChart Message (if you have MyChart) OR . A paper copy in the mail If you have any lab test that is abnormal or we need to change your treatment, we will call you to review the results.   Testing/Procedures: A zio monitor was ordered today. It will remain on for 14 days. You will then return monitor and event diary in provided box. It takes 1-2 weeks for report to be downloaded and returned to [place]. We will call you with the results. If monitor falls off or has orange flashing light, please call Zio for further instructions.     Follow-Up: At [facility], you and your health needs are our priority.  As part of our continuing mission to provide you with exceptional heart care, we have created designated Provider Care Teams.  These Care Teams include your primary Cardiologist (physician) and Advanced Practice Providers (APPs -  Physician Assistants and Nurse Practitioners) who all work together to provide you with the care you need, when you need it.  We recommend signing up for the patient portal called "MyChart".  Sign up information is provided on this After Visit Summary.  MyChart is used to connect with patients for Virtual Visits (Telemedicine).  Patients are able to view lab/test results, encounter notes, upcoming appointments, etc.  Non-urgent messages can be sent to your provider as well.   To learn more about what you can do with MyChart, go to [email or web address].    Your next appointment:   1 month(s)  The format for your next appointment:   In  Person  Provider:   Jenne Campus, MD   Other Instructions

## 2021-01-01 NOTE — Progress Notes (Signed)
Cardiology Office Note:    Date:  01/01/2021   ID:  Phillip Neal, DOB 06/11/1931, MRN [id number]  PCP:  Raina Mina., MD  Cardiologist:  Jenne Campus, MD    Referring MD: Raina Mina., MD   Chief Complaint  Patient presents with  . Loss of Consciousness    History of Present Illness:    Phillip Neal is a 87 y.o. male with past medical history significant for trifascicular block, essential hypertension, atypical chest pain with stress test being negative done just last year, dyslipidemia, dyspnea on exertion.  Previously we concentrated our work-up on dyspnea exertion have been unrevealing from cardiac standpoint of view echocardiogram showed preserved ejection fraction, stress test no showed no evidence of ischemia.  He came to the office today because he did have episode of syncope he apparently got up in the morning went to the kitchen did some work that he come back and he sits in the recliner he was talking to his wife for a while then he check some phone and then She knows his wife is kind of shaking him and waking him up.  According to his wife she was trying to get his attention but he was not responding when she got up and went to him and started shaking when he woke up.  What is the most worrisome out of the center history is the fact that he wet himself when it happened.  After that he got up he went to the kitchen was able to make breakfast.  He has felt a little weak and tired all day but otherwise no more problems he does get some dizziness which is not related to anything.  He did have Zio patch done in March which did not show any worsening of conduction problem.  Shortness of breath is still the same.  Past Medical History:  Diagnosis Date  . Arthritis    oa bo hips  . Asbestos exposure 04/01/2020  . Atopic dermatitis 01/08/2020  . Atypical chest pain 09/27/2017  . 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/11/2016  . Cancer ([place])    squamous cell removed 3 yrs  ago from upper chest by neck  . Cardiomyopathy ([place]) 01/08/2020   Formatting of this note might be different from the original. EF 50-55%. Manu Rubey.  Scar on ST 2019. PVC on monitor.  . Chronic rhinitis 11/07/2018  . CKD (chronic kidney disease), stage III ([place]) 01/11/2016  . Cough last week   no phlegm jill dr Wynelle Link pa aware  . Diabetes mellitus with stage 3 chronic kidney disease ([place]) 01/11/2016  . Diabetes mellitus without complication ([place])   . Dyspnea on exertion 04/01/2020  . Elevated prostate specific antigen (PSA) 03/22/2016   Formatting of this note might be different from the original. No longer following.  . Essential hypertension 01/11/2016  . GERD without esophagitis 05/01/2018  . History of iron deficiency 05/28/2018   Formatting of this note might be different from the original. After surgeries. Follow. No longer anemic.  Marland Kitchen History of kidney stones    years ago, passed on own  . Hyperlipidemia   . Hypertension   . Mixed hyperlipidemia 01/11/2016  . OA (osteoarthritis) of hip 01/31/2018  . 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 Strain of knee 11/25/2019  . Vertigo    jan  2019 surgery rescheduled ent dr Laurance Flatten [place] no vertigo since end of january    Past Surgical History:  Procedure Laterality Date  . colonscopy  with polyp removal  . HERNIA REPAIR  1954   left inguinal hernia  . TOTAL HIP ARTHROPLASTY Left 01/31/2018   Procedure: LEFT TOTAL HIP ARTHROPLASTY ANTERIOR APPROACH;  Surgeon: Gaynelle Arabian, MD;  Location: [facility];  Service: Orthopedics;  Laterality: Left;  . TOTAL HIP ARTHROPLASTY Right 05/16/2018   Procedure: RIGHT TOTAL HIP ARTHROPLASTY ANTERIOR APPROACH;  Surgeon: Gaynelle Arabian, MD;  Location: [facility];  Service: Orthopedics;  Laterality: Right;    Current Medications: Current Meds  Medication Sig  . aspirin EC 81 MG tablet Take 81 mg by mouth daily.  . clotrimazole-betamethasone (LOTRISONE) cream Apply 1 application topically 2  (two) times daily as needed (for groin rash/itch).   . Coenzyme Q10 (CO Q 10) 100 MG CAPS Take 100 mg by mouth 2 (two) times daily.  Marland Kitchen ezetimibe (ZETIA) 10 MG tablet Take 1 tablet (10 mg total) by mouth daily.  . fluticasone (FLONASE) 50 MCG/ACT nasal spray Place 2 sprays into both nostrils daily as needed for allergies or rhinitis.  . hydrochlorothiazide (HYDRODIURIL) 12.5 MG tablet Take 12.5 mg by mouth daily.   Marland Kitchen lisinopril (PRINIVIL,ZESTRIL) 40 MG tablet Take 40 mg by mouth daily.   . metFORMIN (GLUCOPHAGE-XR) 500 MG 24 hr tablet Take 1,000 mg by mouth 2 (two) times daily.   . vitamin B-12 (CYANOCOBALAMIN) 1000 MCG tablet Take 1,000 mcg by mouth daily.     Allergies:   Ciprofloxacin, Other, Statins, and Sulfamethoxazole   Social History   Socioeconomic History  . Marital status: Married    Spouse name: Not on file  . Number of children: Not on file  . Years of education: Not on file  . Highest education level: Not on file  Occupational History  . Not on file  Tobacco Use  . Smoking status: Never Smoker  . Smokeless tobacco: Never Used  Vaping Use  . Vaping Use: Never used  Substance and Sexual Activity  . Alcohol use: Never  . Drug use: Never  . Sexual activity: Not on file  Other Topics Concern  . Not on file  Social History Narrative  . Not on file   Social Determinants of Health   Financial Resource Strain: Not on file  Food Insecurity: Not on file  Transportation Needs: Not on file  Physical Activity: Not on file  Stress: Not on file  Social Connections: Not on file     Family History: [name] family history includes Heart failure in his mother. ROS:   Please see the history of present illness.    All 14 point review of systems negative except as described per history of present illness  EKGs/Labs/Other Studies Reviewed:      Recent Labs: No results found for requested labs within last 8760 hours.  Recent Lipid Panel No results found for: CHOL,  TRIG, HDL, CHOLHDL, VLDL, LDLCALC, LDLDIRECT  Physical Exam:    VS:  BP 136/88 (BP Location: Left Arm, Patient Position: Sitting)   Pulse 67   Ht 5\' 9"  (1.753 m)   Wt 224 lb (101.6 kg)   SpO2 99%   BMI 33.08 kg/m     Wt Readings from Last 3 Encounters:  01/01/21 224 lb (101.6 kg)  04/01/20 221 lb (100.2 kg)  03/19/20 215 lb (97.5 kg)     GEN:  Well nourished, well developed in no acute distress HEENT: Normal NECK: No JVD; No carotid bruits LYMPHATICS: No lymphadenopathy CARDIAC: RRR, no murmurs, no rubs, no gallops RESPIRATORY:  Clear to auscultation without  rales, wheezing or rhonchi  ABDOMEN: Soft, non-tender, non-distended MUSCULOSKELETAL:  No edema; No deformity  SKIN: Warm and dry LOWER EXTREMITIES: no swelling NEUROLOGIC:  Alert and oriented x 3 PSYCHIATRIC:  Normal affect   ASSESSMENT:    1. Bifascicular bundle branch block   2. Essential hypertension   3. Diabetes mellitus with stage 3 chronic kidney disease ([place])   4. Dyspnea on exertion   5. Mixed hyperlipidemia   6. Syncope and collapse    PLAN:    In order of problems listed above:  1. 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  Obviously now with this episode of syncope situation dramatically changed.  He does have also PVC therefore it could be tachyarrhythmia as well.  The plan will be I will put Zio patch on him about the one that allowed [place] to do telemetry remotely.  He will be scheduled to see EP for evaluation.  I did review his test from before showing preserved ejection fraction of left ventricle. 2.  Essential hypertension blood pressure seems to be well controlled continue present management. 3. Diabetes followed by internal medicine team apparently stable 4.  Syncope new finding plan as outlined above. 5. Dyslipidemia: He is on Zetia, will call primary care physician to get fasting lipid profile  Medication Adjustments/Labs and Tests  Ordered: Current medicines are reviewed at length with the patient today.  Concerns regarding medicines are outlined above.  Orders Placed This Encounter  Procedures  . LONG TERM MONITOR-LIVE TELEMETRY (3-14 DAYS)  . EKG 12-Lead   Medication changes: No orders of the defined types were placed in this encounter.   Signed, Park Liter, MD, [facility] 01/01/2021 11:53 AM    [place]

## 2021-01-15 DIAGNOSIS — R55 Syncope and collapse: Secondary | ICD-10-CM

## 2021-01-18 ENCOUNTER — Institutional Professional Consult (permissible substitution): Payer: Medicare Other | Admitting: Cardiology

## 2021-01-21 DIAGNOSIS — R0602 Shortness of breath: Secondary | ICD-10-CM

## 2021-01-21 HISTORY — DX: Shortness of breath: R06.02

## 2021-01-29 ENCOUNTER — Ambulatory Visit (INDEPENDENT_AMBULATORY_CARE_PROVIDER_SITE_OTHER): Payer: Medicare Other | Admitting: Cardiology

## 2021-01-29 ENCOUNTER — Encounter: Payer: Self-pay | Admitting: Cardiology

## 2021-01-29 ENCOUNTER — Other Ambulatory Visit: Payer: Self-pay

## 2021-01-29 VITALS — BP 136/84 | HR 67 | Ht 69.0 in | Wt 224.0 lb

## 2021-01-29 DIAGNOSIS — R55 Syncope and collapse: Secondary | ICD-10-CM | POA: Diagnosis not present

## 2021-01-29 DIAGNOSIS — I10 Essential (primary) hypertension: Secondary | ICD-10-CM | POA: Diagnosis not present

## 2021-01-29 DIAGNOSIS — R0609 Other forms of dyspnea: Secondary | ICD-10-CM

## 2021-01-29 DIAGNOSIS — I452 Bifascicular block: Secondary | ICD-10-CM

## 2021-01-29 DIAGNOSIS — R0600 Dyspnea, unspecified: Secondary | ICD-10-CM

## 2021-01-29 NOTE — Progress Notes (Signed)
Cardiology Office Note:    Date:  01/29/2021   ID:  Phillip Neal, DOB [birth year], MRN [id number]  PCP:  Raina Mina., MD  Cardiologist:  Jenne Campus, MD    Referring MD: Raina Mina., MD   No chief complaint on file. I am doing fine  History of Present Illness:    Phillip Neal is a 87 y.o. male  with past medical history significant for trifascicular block, essential hypertension, atypical chest pain with stress test being negative done just last year, dyslipidemia, dyspnea on exertion.  Previously we concentrated our work-up on dyspnea exertion have been unrevealing from cardiac standpoint of view echocardiogram showed preserved ejection fraction, stress test no showed no evidence of ischemia.  He came to the office today because he did have episode of syncope he apparently got up in the morning went to the kitchen did some work that he come back and he sits in the recliner he was talking to his wife for a while then he check some phone and then She knows his wife is kind of shaking him and waking him up.  According to his wife she was trying to get his attention but he was not responding when she got up and went to him and started shaking when he woke up.  What is the most worrisome out of the center history is the fact that he wet himself when it happened.  After that he got up he went to the kitchen was able to make breakfast.  Since that time I put Zio patch on him.  Monitor did not show any critical bradycardia episodes of nonconducted P wave which resulted in pauses up to 3.1 seconds but those were asymptomatic.  Since the time I seen him he did not have any more chest pain tightness squeezing pressure burning chest dizziness or passing out. He is busy taking care of his wife who does have reactivation of breast cancer.  Of course he is very concerned about it.  They have been married together for 83 years.  Therefore, he had no chance to exercise since have seen him  last time.  Past Medical History:  Diagnosis Date  . Arthritis    oa bo hips  . Asbestos exposure 04/01/2020  . Atopic dermatitis 01/08/2020  . Atypical chest pain 09/27/2017  . 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/11/2016  . 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 Cardiomyopathy ([place]) 01/08/2020   Formatting of this note might be different from the original. EF 50-55%. Krasowski.  Scar on ST 2019. PVC on monitor.  . Chronic rhinitis 11/07/2018  . CKD (chronic kidney disease), stage III ([place]) 01/11/2016  . Cough last week   no phlegm jill dr Wynelle Link pa aware  . Diabetes mellitus with stage 3 chronic kidney disease ([place]) 01/11/2016  . Diabetes mellitus without complication ([place])   . Dyspnea on exertion 04/01/2020  . Elevated prostate specific antigen (PSA) 03/22/2016   Formatting of this note might be different from the original. No longer following.  . Essential hypertension 01/11/2016  . GERD without esophagitis 05/01/2018  . History of iron deficiency 05/28/2018   Formatting of this note might be different from the original. After surgeries. Follow. No longer anemic.  Marland Kitchen History of kidney stones    years ago, passed on own  . Hyperlipidemia   . Hypertension   . Mixed hyperlipidemia 01/11/2016  . OA (osteoarthritis) of hip 01/31/2018  .  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 Shortness of breath 01/21/2021  . Strain of knee 11/25/2019  . Syncope and collapse 01/01/2021  . Vertigo    jan  2019 surgery rescheduled ent dr Laurance Flatten [place] no vertigo since end of january    Past Surgical History:  Procedure Laterality Date  . colonscopy     with polyp removal  . HERNIA REPAIR  1954   left inguinal hernia  . TOTAL HIP ARTHROPLASTY Left 01/31/2018   Procedure: LEFT TOTAL HIP ARTHROPLASTY ANTERIOR APPROACH;  Surgeon: Gaynelle Arabian, MD;  Location: [facility];  Service: Orthopedics;  Laterality: Left;  . TOTAL HIP ARTHROPLASTY Right 05/16/2018    Procedure: RIGHT TOTAL HIP ARTHROPLASTY ANTERIOR APPROACH;  Surgeon: Gaynelle Arabian, MD;  Location: [facility];  Service: Orthopedics;  Laterality: Right;    Current Medications: Current Meds  Medication Sig  . aspirin EC 81 MG tablet Take 81 mg by mouth daily.  . clotrimazole-betamethasone (LOTRISONE) cream Apply 1 application topically 2 (two) times daily as needed (for groin rash/itch).   . Coenzyme Q10 (CO Q 10) 100 MG CAPS Take 100 mg by mouth 2 (two) times daily.  . fluticasone (FLONASE) 50 MCG/ACT nasal spray Place 2 sprays into both nostrils daily as needed for allergies or rhinitis.  . hydrochlorothiazide (HYDRODIURIL) 12.5 MG tablet Take 12.5 mg by mouth daily.   Marland Kitchen lisinopril (PRINIVIL,ZESTRIL) 40 MG tablet Take 40 mg by mouth daily.   . metFORMIN (GLUCOPHAGE-XR) 500 MG 24 hr tablet Take 1,000 mg by mouth 2 (two) times daily.   . vitamin B-12 (CYANOCOBALAMIN) 1000 MCG tablet Take 1,000 mcg by mouth daily.     Allergies:   Ciprofloxacin, Other, Statins, and Sulfamethoxazole   Social History   Socioeconomic History  . Marital status: Married    Spouse name: Not on file  . Number of children: Not on file  . Years of education: Not on file  . Highest education level: Not on file  Occupational History  . Not on file  Tobacco Use  . Smoking status: Never Smoker  . Smokeless tobacco: Never Used  Vaping Use  . Vaping Use: Never used  Substance and Sexual Activity  . Alcohol use: Never  . Drug use: Never  . Sexual activity: Not on file  Other Topics Concern  . Not on file  Social History Narrative  . Not on file   Social Determinants of Health   Financial Resource Strain: Not on file  Food Insecurity: Not on file  Transportation Needs: Not on file  Physical Activity: Not on file  Stress: Not on file  Social Connections: Not on file     Family History: [name] family history includes Heart failure in his mother. ROS:   Please see the history of present  illness.    All 14 point review of systems negative except as described per history of present illness  EKGs/Labs/Other Studies Reviewed:      Recent Labs: No results found for requested labs within last 8760 hours.  Recent Lipid Panel No results found for: CHOL, TRIG, HDL, CHOLHDL, VLDL, LDLCALC, LDLDIRECT  Physical Exam:    VS:  BP 136/84 (BP Location: Right Arm, Patient Position: Sitting)   Pulse 67   Ht 5\' 9"  (1.753 m)   Wt 224 lb (101.6 kg)   SpO2 96%   BMI 33.08 kg/m     Wt Readings from Last 3 Encounters:  01/29/21 224 lb (101.6 kg)  01/01/21 224 lb (  101.6 kg)  04/01/20 221 lb (100.2 kg)     GEN:  Well nourished, well developed in no acute distress HEENT: Normal NECK: No JVD; No carotid bruits LYMPHATICS: No lymphadenopathy CARDIAC: RRR, no murmurs, no rubs, no gallops RESPIRATORY:  Clear to auscultation without rales, wheezing or rhonchi  ABDOMEN: Soft, non-tender, non-distended MUSCULOSKELETAL:  No edema; No deformity  SKIN: Warm and dry LOWER EXTREMITIES: no swelling NEUROLOGIC:  Alert and oriented x 3 PSYCHIATRIC:  Normal affect   ASSESSMENT:    1. Bifascicular bundle branch block   2. Syncope and collapse   3. Essential hypertension   4. Dyspnea on exertion    PLAN:    In order of problems listed above:  1. Trifascicular block with episode of syncope.  Holter monitor did not show any dramatic arrhythmia however there is some slowing down of the heart rate he is scheduled to see our EP team for consideration of pacing.  Since I seen him last time there was no new episode. 2. Essential hypertension, blood pressure seems to be well controlled we will continue present management. 3. Dyspnea on exertion stable he tells me since I seen him last time he had no chance of exercise therefore he cannot tell me if there is a worsening of this problem. 4. Cholesterol status I did review lab work test done by primary care physician showing LDL of 78 HDL 44 is a  good cholesterol profile we will continue present management.  That was done on 17 February of this year.  He also had hemoglobin A1c done which was mildly elevated at 6.8.  I encouraged him to be a little more active.   Medication Adjustments/Labs and Tests Ordered: Current medicines are reviewed at length with the patient today.  Concerns regarding medicines are outlined above.  No orders of the defined types were placed in this encounter.  Medication changes: No orders of the defined types were placed in this encounter.   Signed, Park Liter, MD, [facility] 01/29/2021 11:42 AM    [place]

## 2021-01-29 NOTE — Patient Instructions (Signed)

## 2021-02-15 ENCOUNTER — Other Ambulatory Visit: Payer: Self-pay

## 2021-02-15 ENCOUNTER — Encounter: Payer: Self-pay | Admitting: Cardiology

## 2021-02-15 ENCOUNTER — Ambulatory Visit (INDEPENDENT_AMBULATORY_CARE_PROVIDER_SITE_OTHER): Payer: Medicare Other | Admitting: Cardiology

## 2021-02-15 VITALS — BP 148/68 | HR 62 | Ht 69.0 in | Wt 222.6 lb

## 2021-02-15 DIAGNOSIS — R55 Syncope and collapse: Secondary | ICD-10-CM

## 2021-02-15 MED ORDER — METOPROLOL SUCCINATE ER 25 MG PO TB24: 25.0000 mg | ORAL_TABLET | Freq: Every day | ORAL | 3 refills | Status: DC

## 2021-02-15 NOTE — Patient Instructions (Addendum)
Medication Instructions:  Your physician has recommended you make the following change in your medication:  1. START Toprol 25 mg once daily  *If you need a refill on your cardiac medications before your next appointment, please call your pharmacy*   Lab Work: None ordered   Testing/Procedures: None ordered   Follow-Up: At [facility], you and your health needs are our priority.  As part of our continuing mission to provide you with exceptional heart care, we have created designated Provider Care Teams.  These Care Teams include your primary Cardiologist (physician) and Advanced Practice Providers (APPs -  Physician Assistants and Nurse Practitioners) who all work together to provide you with the care you need, when you need it.  Your next appointment:   6 week(s) ---  on 03/29/2021 @ 12:00 pm  The format for your next appointment:   In Person  Provider:   Allegra Lai, MD    Thank you for choosing [facility]!!   Phillip Curet, RN [phone]   Other Instructions  Metoprolol Extended-Release Tablets What is this medicine? METOPROLOL (me TOE proe lole) is a beta blocker. It decreases the amount of work your heart has to do and helps your heart beat regularly. It treats high blood pressure and/or prevent chest pain (also called angina). It also treats heart failure. This medicine may be used for other purposes; ask your health care provider or pharmacist if you have questions. COMMON BRAND NAME(S): toprol, Toprol XL What should I tell my health care provider before I take this medicine? They need to know if you have any of these conditions:  diabetes  heart or vessel disease like slow heart rate, worsening heart failure, heart block, sick sinus syndrome or Raynaud's disease  kidney disease  liver disease  lung or breathing disease, like asthma or emphysema  pheochromocytoma  thyroid disease  an unusual or allergic reaction to metoprolol, other  beta-blockers, medicines, foods, dyes, or preservatives  pregnant or trying to get pregnant  breast-feeding How should I use this medicine? Take this drug by mouth. Take it as directed on the prescription label at the same time every day. Take it with food. You may cut the tablet in half if it is scored (has a line in the middle of it). This may help you swallow the tablet if the whole tablet is too big. Be sure to take both halves. Do not take just one-half of the tablet. Keep taking it unless your health care provider tells you to stop. Talk to your health care provider about the use of this drug in children. While it may be prescribed for children as young as 6 for selected conditions, precautions do apply. Overdosage: If you think you have taken too much of this medicine contact a poison control center or emergency room at once. NOTE: This medicine is only for you. Do not share this medicine with others. What if I miss a dose? If you miss a dose, take it as soon as you can. If it is almost time for your next dose, take only that dose. Do not take double or extra doses. What may interact with this medicine? This medicine may interact with the following medications:  certain medicines for blood pressure, heart disease, irregular heart beat  certain medicines for depression, like monoamine oxidase (MAO) inhibitors, fluoxetine, or paroxetine  clonidine  dobutamine  epinephrine  isoproterenol  reserpine This list may not describe all possible interactions. Give your health care provider a list  of all the medicines, herbs, non-prescription drugs, or dietary supplements you use. Also tell them if you smoke, drink alcohol, or use illegal drugs. Some items may interact with your medicine. What should I watch for while using this medicine? Visit your doctor or health care professional for regular check ups. Contact your doctor right away if your symptoms worsen. Check your blood pressure and  pulse rate regularly. Ask your health care professional what your blood pressure and pulse rate should be, and when you should contact them. You may get drowsy or dizzy. Do not drive, use machinery, or do anything that needs mental alertness until you know how this medicine affects you. Do not sit or stand up quickly, especially if you are an older patient. This reduces the risk of dizzy or fainting spells. Contact your doctor if these symptoms continue. Alcohol may interfere with the effect of this medicine. Avoid alcoholic drinks. This medicine may increase blood sugar. Ask your healthcare provider if changes in diet or medicines are needed if you have diabetes. What side effects may I notice from receiving this medicine? Side effects that you should report to your doctor or health care professional as soon as possible:  allergic reactions like skin rash, itching or hives  cold or numb hands or feet  depression  difficulty breathing  faint  fever with sore throat  irregular heartbeat, chest pain  rapid weight gain  signs and symptoms of high blood sugar such as being more thirsty or hungry or having to urinate more than normal. You may also feel very tired or have blurry vision.  swollen legs or ankles Side effects that usually do not require medical attention (report to your doctor or health care professional if they continue or are bothersome):  anxiety or nervousness  change in sex drive or performance  dry skin  headache  nightmares or trouble sleeping  short term memory loss  stomach upset or diarrhea This list may not describe all possible side effects. Call your doctor for medical advice about side effects. You may report side effects to FDA at 1-800-FDA-1088. Where should I keep my medicine? Keep out of the reach of children and pets. Store at room temperature between 20 and 25 degrees C (68 and 77 degrees F). Throw away any unused drug after the expiration  date. NOTE: This sheet is a summary. It may not cover all possible information. If you have questions about this medicine, talk to your doctor, pharmacist, or health care provider.  2021 Elsevier/Gold Standard (2019-06-27 18:23:00)

## 2021-02-15 NOTE — Progress Notes (Signed)
Electrophysiology Office Note   Date:  02/15/2021   ID:  Phillip Neal, DOB June 15, 1931, MRN [id number]  PCP:  Raina Mina., MD  Cardiologist: Agustin Cree Primary Electrophysiologist:  Iniko Robles Meredith Leeds, MD    Chief Complaint: syncope, palpitations   History of Present Illness: Phillip Neal is a 87 y.o. male who is being seen today for the evaluation of syncope, palpitations at the request of Park Liter, MD. Presenting today for electrophysiology evaluation.  He has a history of trifascicular block, hypertension, hyperlipidemia, dyspnea on exertion.  He had a recent episode of syncope.  This occurred when he got up in the morning and went to the kitchen.  He sat back down in his recliner.  His wife tried to wake him up but he would not respond.  He had loss of bladder control when this occurred.  He did have a ZIO monitor, report below.  After wearing the monitor, he had a second episode.  He got up from sleep at approximately 5:30 in the morning.  When he stood up, he had quite a bit of dizziness and weakness.  This lasted for few minutes.  He continued to stand at the side of the bed.  He got himself to the kitchen and drank 2 large glasses of water.  He started to feel better after a few minutes.  He is unsure whether or not he was tachycardic.  He had a third episode of palpitations and dizziness as well as weakness.  He brings in heart rate and blood pressure recordings from this episode with heart rates up to 190/min and blood pressures in the 90s over 60s.  When these episodes are not occurring, he feels well and is without complaint.  Today, he denies symptoms of palpitations, chest pain, shortness of breath, orthopnea, PND, lower extremity edema, claudication, dizziness, presyncope, syncope, bleeding, or neurologic sequela. The patient is tolerating medications without difficulties.    Past Medical History:  Diagnosis Date  . Arthritis    oa bo hips  .  Asbestos exposure 04/01/2020  . Atopic dermatitis 01/08/2020  . Atypical chest pain 09/27/2017  . 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/11/2016  . Cancer ([place])    squamous cell removed 3 yrs ago from upper chest by neck  . Cardiomyopathy ([place]) 01/08/2020   Formatting of this note might be different from the original. EF 50-55%. Krasowski.  Scar on ST 2019. PVC on monitor.  . Chronic rhinitis 11/07/2018  . CKD (chronic kidney disease), stage III ([place]) 01/11/2016  . Cough last week   no phlegm jill dr Wynelle Link pa aware  . Diabetes mellitus with stage 3 chronic kidney disease ([place]) 01/11/2016  . Diabetes mellitus without complication ([place])   . Dyspnea on exertion 04/01/2020  . Elevated prostate specific antigen (PSA) 03/22/2016   Formatting of this note might be different from the original. No longer following.  . Essential hypertension 01/11/2016  . GERD without esophagitis 05/01/2018  . History of iron deficiency 05/28/2018   Formatting of this note might be different from the original. After surgeries. Follow. No longer anemic.  Marland Kitchen History of kidney stones    years ago, passed on own  . Hyperlipidemia   . Hypertension   . Mixed hyperlipidemia 01/11/2016  . OA (osteoarthritis) of hip 01/31/2018  . 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 Shortness of breath 01/21/2021  . Strain of knee 11/25/2019  . Syncope and collapse 01/01/2021  . Vertigo    jan  2019 surgery rescheduled ent dr Laurance Flatten [place] no vertigo since end of january   Past Surgical History:  Procedure Laterality Date  . colonscopy     with polyp removal  . HERNIA REPAIR  1954   left inguinal hernia  . TOTAL HIP ARTHROPLASTY Left 01/31/2018   Procedure: LEFT TOTAL HIP ARTHROPLASTY ANTERIOR APPROACH;  Surgeon: Gaynelle Arabian, MD;  Location: [facility];  Service: Orthopedics;  Laterality: Left;  . TOTAL HIP ARTHROPLASTY Right 05/16/2018   Procedure: RIGHT TOTAL HIP ARTHROPLASTY ANTERIOR APPROACH;  Surgeon: Gaynelle Arabian, MD;   Location: [facility];  Service: Orthopedics;  Laterality: Right;     Current Outpatient Medications  Medication Sig Dispense Refill  . aspirin EC 81 MG tablet Take 81 mg by mouth daily.    . clotrimazole-betamethasone (LOTRISONE) cream Apply 1 application topically 2 (two) times daily as needed (for groin rash/itch).     . Coenzyme Q10 (CO Q 10) 100 MG CAPS Take 100 mg by mouth 2 (two) times daily.    Marland Kitchen ezetimibe (ZETIA) 10 MG tablet Take 1 tablet (10 mg total) by mouth daily. 90 tablet 3  . fluticasone (FLONASE) 50 MCG/ACT nasal spray Place 2 sprays into both nostrils daily as needed for allergies or rhinitis.    . hydrochlorothiazide (HYDRODIURIL) 12.5 MG tablet Take 12.5 mg by mouth daily.     Marland Kitchen lisinopril (PRINIVIL,ZESTRIL) 40 MG tablet Take 40 mg by mouth daily.     . metFORMIN (GLUCOPHAGE-XR) 500 MG 24 hr tablet Take 1,000 mg by mouth 2 (two) times daily.     . metoprolol succinate (TOPROL XL) 25 MG 24 hr tablet Take 1 tablet (25 mg total) by mouth daily. 30 tablet 3  . vitamin B-12 (CYANOCOBALAMIN) 1000 MCG tablet Take 1,000 mcg by mouth daily.     No current facility-administered medications for this visit.    Allergies:   Ciprofloxacin, Other, Statins, and Sulfamethoxazole   Social History:  The patient  reports that he has never smoked. He has never used smokeless tobacco. He reports that he does not drink alcohol and does not use drugs.   Family History:  [name] family history includes Heart failure in his mother.    ROS:  Please see the history of present illness.   Otherwise, review of systems is positive for none.   All other systems are reviewed and negative.    PHYSICAL EXAM: VS:  BP (!) 148/68   Pulse 62   Ht 5\' 9"  (1.753 m)   Wt 222 lb 9.6 oz (101 kg)   SpO2 99%   BMI 32.87 kg/m  , BMI Body mass index is 32.87 kg/m. GEN: Well nourished, well developed, in no acute distress  HEENT: normal  Neck: no JVD, carotid bruits, or masses Cardiac: RRR; no murmurs,  rubs, or gallops,no edema  Respiratory:  clear to auscultation bilaterally, normal work of breathing GI: soft, nontender, nondistended, + BS MS: no deformity or atrophy  Skin: warm and dry Neuro:  Strength and sensation are intact Psych: euthymic mood, full affect  EKG:  EKG is ordered today. Personal review of the ekg ordered shows sinus rhythm, first-degree AV block, right bundle branch block, left anterior fascicular block  Recent Labs: No results found for requested labs within last 8760 hours.    Lipid Panel  No results found for: CHOL, TRIG, HDL, CHOLHDL, VLDL, LDLCALC, LDLDIRECT   Wt Readings from Last 3 Encounters:  02/15/21 222 lb 9.6 oz (101 kg)  01/29/21  224 lb (101.6 kg)  01/01/21 224 lb (101.6 kg)      Other studies Reviewed: Additional studies/ records that were reviewed today include: TTE 03/02/20  Review of the above records today demonstrates:  1. Left ventricular ejection fraction, by estimation, is 60 to 65%. The  left ventricle has normal function. The left ventricle has no regional  wall motion abnormalities. There is mild concentric left ventricular  hypertrophy. Left ventricular diastolic  parameters are consistent with Grade I diastolic dysfunction (impaired  relaxation).  2. Right ventricular systolic function is normal. The right ventricular  size is normal. There is normal pulmonary artery systolic pressure.  3. The mitral valve is normal in structure. No evidence of mitral valve  regurgitation. No evidence of mitral stenosis.  4. The aortic valve is normal in structure. Aortic valve regurgitation is  mild. Mild aortic valve sclerosis is present, with no evidence of aortic  valve stenosis.  5. The inferior vena cava is dilated in size with <50% respiratory  variability, suggesting right atrial pressure of 15 mmHg.   Cardiac monitor 02/12/2021 personally reviewed Episode first-degree AV block as well as second-degree type I AV block that  resulted with pauses up to 3.2 seconds.  Symptomatic PVCs noted    ASSESSMENT AND PLAN:  1.  Syncope: At this point I am unclear as to the cause of his syncope.  He also has quite a bit of dizziness.  While wearing his cardiac monitor,He had a few episodes of tachycardia.  This could be the cause of his symptoms.  Start Toprol-XL 25 to see if this improves.  2.  Hypertension: Mildly elevated today.  Usually well controlled.  No changes.  Case discussed with primary cardiology  Current medicines are reviewed at length with the patient today.   The patient does not have concerns regarding his medicines.  The following changes were made today: Start Toprol-XL  Labs/ tests ordered today include:  Orders Placed This Encounter  Procedures  . EKG 12-Lead     Disposition:   FU with Terren Jandreau 6 weeks  Signed, Wretha Laris Meredith Leeds, MD  02/15/2021 12:46 PM     [facility] [address] [place] [address] [phone] (office) [phone] (fax)

## 2021-03-29 ENCOUNTER — Encounter: Payer: Self-pay | Admitting: Cardiology

## 2021-03-29 ENCOUNTER — Other Ambulatory Visit: Payer: Self-pay

## 2021-03-29 ENCOUNTER — Ambulatory Visit (INDEPENDENT_AMBULATORY_CARE_PROVIDER_SITE_OTHER): Payer: Medicare Other | Admitting: Cardiology

## 2021-03-29 VITALS — BP 142/64 | HR 70 | Ht 69.0 in | Wt 222.4 lb

## 2021-03-29 DIAGNOSIS — R55 Syncope and collapse: Secondary | ICD-10-CM | POA: Diagnosis not present

## 2021-03-29 MED ORDER — METOPROLOL SUCCINATE ER 25 MG PO TB24: 25.0000 mg | ORAL_TABLET | Freq: Every day | ORAL | 2 refills | Status: DC

## 2021-03-29 NOTE — Progress Notes (Signed)
Electrophysiology Office Note   Date:  03/29/2021   ID:  Phillip Neal, DOB 1931-04-13, MRN [id number]  PCP:  Raina Mina., MD  Cardiologist: Agustin Cree Primary Electrophysiologist:  Jeffren Dombek Meredith Leeds, MD    Chief Complaint: syncope, palpitations   History of Present Illness: Phillip Neal is a 87 y.o. male who is being seen today for the evaluation of syncope, palpitations at the request of Raina Mina., MD. Presenting today for electrophysiology evaluation.  He has a history of trifascicular block, hypertension, hyperlipidemia, and dyspnea on exertion.  He had an episode of syncope.  This occurred when he got up in the morning and went to the kitchen.  He sat down in his recliner.  His wife tried to wake him up but he did not respond.  He had loss of bladder control.  He wore a ZIO monitor that showed no obvious cause for syncope.  After wearing the monitor, he had a second episode.  He got up from sleeping at 5:30 in the morning.  He stood up had a quite a bit of dizziness and weakness.  This lasted for a few minutes.  He continued to stand at the bedside.  He got himself to the kitchen and drank 2 large glasses of water.  He started to feel better after few minutes.  He is unsure whether or not he was tachycardic.  He had a third episode of palpitations and dizziness as well as weakness.  Due to his palpitations and tachycardia noted on monitor, he was started on Toprol-XL.  Today, denies symptoms of palpitations, chest pain, shortness of breath, orthopnea, PND, lower extremity edema, claudication, dizziness, presyncope, syncope, bleeding, or neurologic sequela. The patient is tolerating medications without difficulties.  Since last being seen he has done well.  He is tolerating his Toprol-XL.  He has not had any further episodes similar to the episodes above.  He is no longer dizzy.  He does complain of some shortness of breath that is chronic for him.   Past Medical  History:  Diagnosis Date  . Arthritis    oa bo hips  . Asbestos exposure 04/01/2020  . Atopic dermatitis 01/08/2020  . Atypical chest pain 09/27/2017  . 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/11/2016  . Cancer ([place])    squamous cell removed 3 yrs ago from upper chest by neck  . Cardiomyopathy ([place]) 01/08/2020   Formatting of this note might be different from the original. EF 50-55%. [place].  Scar on ST 2019. PVC on monitor.  . Chronic rhinitis 11/07/2018  . CKD (chronic kidney disease), stage III ([place]) 01/11/2016  . Cough last week   no phlegm jill dr Wynelle Link pa aware  . Diabetes mellitus with stage 3 chronic kidney disease ([place]) 01/11/2016  . Diabetes mellitus without complication ([place])   . Dyspnea on exertion 04/01/2020  . Elevated prostate specific antigen (PSA) 03/22/2016   Formatting of this note might be different from the original. No longer following.  . Essential hypertension 01/11/2016  . GERD without esophagitis 05/01/2018  . History of iron deficiency 05/28/2018   Formatting of this note might be different from the original. After surgeries. Follow. No longer anemic.  Marland Kitchen History of kidney stones    years ago, passed on own  . Hyperlipidemia   . Hypertension   . Mixed hyperlipidemia 01/11/2016  . OA (osteoarthritis) of hip 01/31/2018  . 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 Shortness of breath 01/21/2021  . Strain of knee  11/25/2019  . Syncope and collapse 01/01/2021  . Vertigo    jan  2019 surgery rescheduled ent dr Laurance Flatten [place] no vertigo since end of january   Past Surgical History:  Procedure Laterality Date  . colonscopy     with polyp removal  . HERNIA REPAIR  1954   left inguinal hernia  . TOTAL HIP ARTHROPLASTY Left 01/31/2018   Procedure: LEFT TOTAL HIP ARTHROPLASTY ANTERIOR APPROACH;  Surgeon: Gaynelle Arabian, MD;  Location: [facility];  Service: Orthopedics;  Laterality: Left;  . TOTAL HIP ARTHROPLASTY Right 05/16/2018   Procedure: RIGHT TOTAL HIP  ARTHROPLASTY ANTERIOR APPROACH;  Surgeon: Gaynelle Arabian, MD;  Location: [facility];  Service: Orthopedics;  Laterality: Right;     Current Outpatient Medications  Medication Sig Dispense Refill  . aspirin EC 81 MG tablet Take 81 mg by mouth daily.    . clotrimazole-betamethasone (LOTRISONE) cream Apply 1 application topically 2 (two) times daily as needed (for groin rash/itch).     . Coenzyme Q10 (CO Q 10) 100 MG CAPS Take 100 mg by mouth 2 (two) times daily.    Marland Kitchen ezetimibe (ZETIA) 10 MG tablet Take 1 tablet (10 mg total) by mouth daily. 90 tablet 3  . fluticasone (FLONASE) 50 MCG/ACT nasal spray Place 2 sprays into both nostrils daily as needed for allergies or rhinitis.    . hydrochlorothiazide (HYDRODIURIL) 12.5 MG tablet Take 12.5 mg by mouth daily.     Marland Kitchen lisinopril (PRINIVIL,ZESTRIL) 40 MG tablet Take 40 mg by mouth daily.     . metFORMIN (GLUCOPHAGE-XR) 500 MG 24 hr tablet Take 1,000 mg by mouth 2 (two) times daily.     . metoprolol succinate (TOPROL XL) 25 MG 24 hr tablet Take 1 tablet (25 mg total) by mouth daily. 30 tablet 3  . vitamin B-12 (CYANOCOBALAMIN) 1000 MCG tablet Take 1,000 mcg by mouth daily.     No current facility-administered medications for this visit.    Allergies:   Ciprofloxacin, Other, Statins, and Sulfamethoxazole   Social History:  The patient  reports that he has never smoked. He has never used smokeless tobacco. He reports that he does not drink alcohol and does not use drugs.   Family History:  [name] family history includes Heart failure in his mother.   ROS:  Please see the history of present illness.   Otherwise, review of systems is positive for none.   All other systems are reviewed and negative.   PHYSICAL EXAM: VS:  BP (!) 142/64   Pulse 70   Ht 5\' 9"  (1.753 m)   Wt 222 lb 6.4 oz (100.9 kg)   SpO2 96%   BMI 32.84 kg/m  , BMI Body mass index is 32.84 kg/m. GEN: Well nourished, well developed, in no acute distress  HEENT: normal  Neck:  no JVD, carotid bruits, or masses Cardiac: RRR; no murmurs, rubs, or gallops,no edema  Respiratory:  clear to auscultation bilaterally, normal work of breathing GI: soft, nontender, nondistended, + BS MS: no deformity or atrophy  Skin: warm and dry Neuro:  Strength and sensation are intact Psych: euthymic mood, full affect  EKG:  EKG is not ordered today. Personal review of the ekg ordered 02/15/21 shows sinus rhythm, first-degree AV block, right bundle branch block, left anterior fascicular block  Recent Labs: No results found for requested labs within last 8760 hours.    Lipid Panel  No results found for: CHOL, TRIG, HDL, CHOLHDL, VLDL, LDLCALC, LDLDIRECT   Wt  Readings from Last 3 Encounters:  03/29/21 222 lb 6.4 oz (100.9 kg)  02/15/21 222 lb 9.6 oz (101 kg)  01/29/21 224 lb (101.6 kg)      Other studies Reviewed: Additional studies/ records that were reviewed today include: TTE 03/02/20  Review of the above records today demonstrates:  1. Left ventricular ejection fraction, by estimation, is 60 to 65%. The  left ventricle has normal function. The left ventricle has no regional  wall motion abnormalities. There is mild concentric left ventricular  hypertrophy. Left ventricular diastolic  parameters are consistent with Grade I diastolic dysfunction (impaired  relaxation).  2. Right ventricular systolic function is normal. The right ventricular  size is normal. There is normal pulmonary artery systolic pressure.  3. The mitral valve is normal in structure. No evidence of mitral valve  regurgitation. No evidence of mitral stenosis.  4. The aortic valve is normal in structure. Aortic valve regurgitation is  mild. Mild aortic valve sclerosis is present, with no evidence of aortic  valve stenosis.  5. The inferior vena cava is dilated in size with <50% respiratory  variability, suggesting right atrial pressure of 15 mmHg.   Cardiac monitor 02/12/2021 personally  reviewed Episode first-degree AV block as well as second-degree type I AV block that resulted with pauses up to 3.2 seconds.  Symptomatic PVCs noted    ASSESSMENT AND PLAN:  1.  Syncope: Since starting his Toprol-XL, he has had no further episodes of syncope.  He is currently feeling well and is without major complaint.  Able to do all of his daily activities and is without restriction.  2.  Hypertension: Mildly elevated today.  Usually well controlled.  Plan per primary cardiology.  Case discussed with primary cardiology   Current medicines are reviewed at length with the patient today.   The patient does not have concerns regarding his medicines.  The following changes were made today: None  Labs/ tests ordered today include:  No orders of the defined types were placed in this encounter.    Disposition:   FU with Welborn Keena 6 months  Signed, Darric Plante Meredith Leeds, MD  03/29/2021 12:14 PM     [address] [place] [id number] [phone] (office) [phone] (fax)

## 2021-03-29 NOTE — Patient Instructions (Signed)
Medication Instructions:  Your physician recommends that you continue on your current medications as directed. Please refer to the Current Medication list given to you today.  *If you need a refill on your cardiac medications before your next appointment, please call your pharmacy*   Lab Work: None ordered If you have labs (blood work) drawn today and your tests are completely normal, you will receive your results only by: . MyChart Message (if you have MyChart) OR . A paper copy in the mail If you have any lab test that is abnormal or we need to change your treatment, we will call you to review the results.   Testing/Procedures: None ordered   Follow-Up: At [facility], you and your health needs are our priority.  As part of our continuing mission to provide you with exceptional heart care, we have created designated Provider Care Teams.  These Care Teams include your primary Cardiologist (physician) and Advanced Practice Providers (APPs -  Physician Assistants and Nurse Practitioners) who all work together to provide you with the care you need, when you need it.  We recommend signing up for the patient portal called "MyChart".  Sign up information is provided on this After Visit Summary.  MyChart is used to connect with patients for Virtual Visits (Telemedicine).  Patients are able to view lab/test results, encounter notes, upcoming appointments, etc.  Non-urgent messages can be sent to your provider as well.   To learn more about what you can do with MyChart, go to [email or web address].    Your next appointment:   6 month(s)  The format for your next appointment:   In Person  Provider:   Will Camnitz, MD   Thank you for choosing [facility]!!   Rashid Whitenight, RN [phone]     

## 2021-05-12 ENCOUNTER — Ambulatory Visit (INDEPENDENT_AMBULATORY_CARE_PROVIDER_SITE_OTHER): Payer: Medicare Other | Admitting: Cardiology

## 2021-05-12 ENCOUNTER — Other Ambulatory Visit: Payer: Self-pay

## 2021-05-12 ENCOUNTER — Encounter: Payer: Self-pay | Admitting: Cardiology

## 2021-05-12 VITALS — BP 144/80 | HR 58 | Ht 69.0 in | Wt 226.0 lb

## 2021-05-12 DIAGNOSIS — E782 Mixed hyperlipidemia: Secondary | ICD-10-CM

## 2021-05-12 DIAGNOSIS — R0789 Other chest pain: Secondary | ICD-10-CM | POA: Diagnosis not present

## 2021-05-12 DIAGNOSIS — I452 Bifascicular block: Secondary | ICD-10-CM

## 2021-05-12 DIAGNOSIS — I10 Essential (primary) hypertension: Secondary | ICD-10-CM

## 2021-05-12 DIAGNOSIS — E119 Type 2 diabetes mellitus without complications: Secondary | ICD-10-CM | POA: Diagnosis not present

## 2021-05-12 NOTE — Patient Instructions (Signed)

## 2021-05-12 NOTE — Progress Notes (Signed)
Cardiology Office Note:    Date:  05/12/2021   ID:  Phillip Neal, DOB 07-17-1931, MRN [id number]  PCP:  Phillip Neal., MD  Cardiologist:  Phillip Campus, MD    Referring MD: Phillip Neal., MD   Chief Complaint  Patient presents with   Follow-up  I am feeling better  History of Present Illness:    Phillip Neal is a 87 y.o. male    with past medical history significant for trifascicular block, essential hypertension, atypical chest pain with stress test being negative done just last year, dyslipidemia, dyspnea on exertion.  Previously we concentrated our work-up on dyspnea exertion have been unrevealing from cardiac standpoint of view echocardiogram showed preserved ejection fraction, stress test no showed no evidence of ischemia.  He came to the office today because he did have episode of syncope he apparently got up in the morning went to the kitchen did some work that he come back and he sits in the recliner he was talking to his wife for a while then he check some phone and then She knows his wife is kind of shaking him and waking him up.  According to his wife she was trying to get his attention but he was not responding when she got up and went to him and started shaking when he woke up.  What is the most worrisome out of the center history is the fact that he wet himself when it happened.  After that he got up he went to the kitchen was able to make breakfast.  Since that time I put Zio patch on him.  Monitor did not show any critical bradycardia episodes of nonconducted P wave which resulted in pauses up to 3.1 seconds but those were asymptomatic.  Since the time I seen him he did not have any more chest pain tightness squeezing pressure burning chest dizziness or passing out. He is busy taking care of his wife who does have reactivation of breast cancer.  Of course he is very concerned about it.  They have been married together for 52 years.  Therefore, he had no chance  to exercise since have seen him last time. He eventually was referred to EP team, he felt not to be candidate for pacemaker, encounter he was put on beta-blocker which seems to be working well.  Overall he said that he does not have any passing out episodes since he was put in this medication.  He described to have situation that look like vertigo he said at one time he sit down and the room started spinning to the point that he had to close his eyes when he closes his eyes he laid down for about 15 minutes and after that everything subsided.  Past Medical History:  Diagnosis Date   Arthritis    oa bo hips   Asbestos exposure 04/01/2020   Atopic dermatitis 01/08/2020   Atypical chest pain 09/27/2017   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/11/2016   Cancer ([place])    squamous cell removed 3 yrs ago from upper chest by neck   Cardiomyopathy ([place]) 01/08/2020   Formatting of this note might be different from the original. EF 50-55%. Phillip Neal.  Scar on ST 2019. PVC on monitor.   Chronic rhinitis 11/07/2018   CKD (chronic kidney disease), stage III ([place]) 01/11/2016   Cough last week   no phlegm Phillip Neal dr Phillip Link pa aware   Diabetes mellitus with stage 3 chronic kidney disease ([place]) 01/11/2016   Diabetes  mellitus without complication ([place])    Dyspnea on exertion 04/01/2020   Elevated prostate specific antigen (PSA) 03/22/2016   Formatting of this note might be different from the original. No longer following.   Essential hypertension 01/11/2016   GERD without esophagitis 05/01/2018   History of iron deficiency 05/28/2018   Formatting of this note might be different from the original. After surgeries. Follow. No longer anemic.   History of kidney stones    years ago, passed on own   Hyperlipidemia    Hypertension    Mixed hyperlipidemia 01/11/2016   OA (osteoarthritis) of hip 01/31/2018   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   Shortness of breath 01/21/2021   Strain of knee 11/25/2019    Syncope and collapse 01/01/2021   Vertigo    jan  2019 surgery rescheduled ent dr Phillip Neal [place] no vertigo since end of january    Past Surgical History:  Procedure Laterality Date   colonscopy     with polyp removal   HERNIA REPAIR  1954   left inguinal hernia   TOTAL HIP ARTHROPLASTY Left 01/31/2018   Procedure: LEFT TOTAL HIP ARTHROPLASTY ANTERIOR APPROACH;  Surgeon: Phillip Arabian, MD;  [facility];  Service: Orthopedics;  Laterality: Left;   TOTAL HIP ARTHROPLASTY Right 05/16/2018   Procedure: RIGHT TOTAL HIP ARTHROPLASTY ANTERIOR APPROACH;  Surgeon: Phillip Arabian, MD;  [facility];  Service: Orthopedics;  Laterality: Right;    Current Medications: Current Meds  Medication Sig   aspirin EC 81 MG tablet Take 81 mg by mouth daily.   clotrimazole-betamethasone (LOTRISONE) cream Apply 1 application topically 2 (two) times daily as needed (for groin rash/itch).    Coenzyme Q10 (CO Q 10) 100 MG CAPS Take 100 mg by mouth 2 (two) times daily.   dicyclomine (BENTYL) 10 MG capsule Take 10 mg by mouth as needed for cramping.   ezetimibe (ZETIA) 10 MG tablet Take 1 tablet (10 mg total) by mouth daily.   fluticasone (FLONASE) 50 MCG/ACT nasal spray Place 2 sprays into both nostrils daily as needed for allergies or rhinitis.   hydrochlorothiazide (HYDRODIURIL) 12.5 MG tablet Take 12.5 mg by mouth daily.    lisinopril (PRINIVIL,ZESTRIL) 40 MG tablet Take 40 mg by mouth daily.    metFORMIN (GLUCOPHAGE-XR) 500 MG 24 hr tablet Take 1,000 mg by mouth 2 (two) times daily.    metoprolol succinate (TOPROL XL) 25 MG 24 hr tablet Take 1 tablet (25 mg total) by mouth daily.   vitamin B-12 (CYANOCOBALAMIN) 1000 MCG tablet Take 1,000 mcg by mouth daily.     Allergies:   Ciprofloxacin, Other, Statins, and Sulfamethoxazole   Social History   Socioeconomic History   Marital status: Married    Spouse name: Not on file   Number of children: Not on file   Years of education: Not on file    Highest education level: Not on file  Occupational History   Not on file  Tobacco Use   Smoking status: Never   Smokeless tobacco: Never  Vaping Use   Vaping Use: Never used  Substance and Sexual Activity   Alcohol use: Never   Drug use: Never   Sexual activity: Not on file  Other Topics Concern   Not on file  Social History Narrative   Not on file   Social Determinants of Health   Financial Resource Strain: Not on file  Food Insecurity: Not on file  Transportation Needs: Not on file  Physical Activity: Not on  file  Stress: Not on file  Social Connections: Not on file     Family History: [name] family history includes Heart failure in his mother. ROS:   Please see the history of present illness.    All 14 point review of systems negative except as described per history of present illness  EKGs/Labs/Other Studies Reviewed:      Recent Labs: No results found for requested labs within last 8760 hours.  Recent Lipid Panel No results found for: CHOL, TRIG, HDL, CHOLHDL, VLDL, LDLCALC, LDLDIRECT  Physical Exam:    VS:  Ht 5\' 9"  (1.753 m)   Wt 226 lb (102.5 kg)   BMI 33.37 kg/m     Wt Readings from Last 3 Encounters:  05/12/21 226 lb (102.5 kg)  03/29/21 222 lb 6.4 oz (100.9 kg)  02/15/21 222 lb 9.6 oz (101 kg)     GEN:  Well nourished, well developed in no acute distress HEENT: Normal NECK: No JVD; No carotid bruits LYMPHATICS: No lymphadenopathy CARDIAC: RRR, no murmurs, no rubs, no gallops RESPIRATORY:  Clear to auscultation without rales, wheezing or rhonchi  ABDOMEN: Soft, non-tender, non-distended MUSCULOSKELETAL:  No edema; No deformity  SKIN: Warm and dry LOWER EXTREMITIES: no swelling NEUROLOGIC:  Alert and oriented x 3 PSYCHIATRIC:  Normal affect   ASSESSMENT:    1. Bifascicular bundle branch block   2. Essential hypertension   3. Diabetes mellitus without complication ([place])   4. Atypical chest pain   5. Mixed hyperlipidemia     PLAN:    In order of problems listed above:  Bifascicular block.  Did have episode of syncope was evaluated by EP team feeling is that he does not require pacing.  He was actually put on small dose of beta-blocker which seems to be working quite well for him. Essential hypertension slightly elevated today but he said that he check it at home and it is always good we will continue present management. Diabetes: Hemoglobin A1c I have in K PN was 6.8 which is quite reasonable number.  Continue diet as well as Glucophage.  I also find latest data done by his primary care physician with hemoglobin A1c of 6.8 which is 3 months ago. Atypical chest pain denies having any Mixed dyslipidemia, I did review data by his primary care physician LDL 78 HDL 44 acceptable we will continue   Medication Adjustments/Labs and Tests Ordered: Current medicines are reviewed at length with the patient today.  Concerns regarding medicines are outlined above.  No orders of the defined types were placed in this encounter.  Medication changes: No orders of the defined types were placed in this encounter.   Signed, Park Liter, MD, [facility] 05/12/2021 10:05 AM    [place]

## 2021-06-22 ENCOUNTER — Telehealth: Payer: Medicare Other | Admitting: Physician Assistant

## 2021-06-22 DIAGNOSIS — U071 COVID-19: Secondary | ICD-10-CM | POA: Diagnosis not present

## 2021-06-22 MED ORDER — BENZONATATE 100 MG PO CAPS: 100.0000 mg | ORAL_CAPSULE | Freq: Three times a day (TID) | ORAL | 0 refills | Status: DC | PRN

## 2021-06-22 MED ORDER — MOLNUPIRAVIR EUA 200MG CAPSULE: 4.0000 | ORAL_CAPSULE | Freq: Two times a day (BID) | ORAL | 0 refills | Status: AC

## 2021-06-22 NOTE — Patient Instructions (Signed)
Phillip Neal, thank you for joining Leeanne Rio, PA-C for today's virtual visit.  While this provider is not your primary care provider (PCP), if your PCP is located in our provider database this encounter information will be shared with them immediately following your visit.  Consent: (Patient) Phillip Neal provided verbal consent for this virtual visit at the beginning of the encounter.  Current Medications:  Current Outpatient Medications:    benzonatate (TESSALON) 100 MG capsule, Take 1 capsule (100 mg total) by mouth 3 (three) times daily as needed for cough., Disp: 30 capsule, Rfl: 0   molnupiravir EUA 200 mg CAPS, Take 4 capsules (800 mg total) by mouth 2 (two) times daily for 5 days., Disp: 40 capsule, Rfl: 0   aspirin EC 81 MG tablet, Take 81 mg by mouth daily., Disp: , Rfl:    clotrimazole-betamethasone (LOTRISONE) cream, Apply 1 application topically 2 (two) times daily as needed (for groin rash/itch). , Disp: , Rfl:    Coenzyme Q10 (CO Q 10) 100 MG CAPS, Take 100 mg by mouth 2 (two) times daily., Disp: , Rfl:    dicyclomine (BENTYL) 10 MG capsule, Take 10 mg by mouth as needed for cramping., Disp: , Rfl:    ezetimibe (ZETIA) 10 MG tablet, Take 1 tablet (10 mg total) by mouth daily., Disp: 90 tablet, Rfl: 3   fluticasone (FLONASE) 50 MCG/ACT nasal spray, Place 2 sprays into both nostrils daily as needed for allergies or rhinitis., Disp: , Rfl:    hydrochlorothiazide (HYDRODIURIL) 12.5 MG tablet, Take 12.5 mg by mouth daily. , Disp: , Rfl:    lisinopril (PRINIVIL,ZESTRIL) 40 MG tablet, Take 40 mg by mouth daily. , Disp: , Rfl:    metFORMIN (GLUCOPHAGE-XR) 500 MG 24 hr tablet, Take 1,000 mg by mouth 2 (two) times daily. , Disp: , Rfl:    metoprolol succinate (TOPROL XL) 25 MG 24 hr tablet, Take 1 tablet (25 mg total) by mouth daily., Disp: 90 tablet, Rfl: 2   vitamin B-12 (CYANOCOBALAMIN) 1000 MCG tablet, Take 1,000 mcg by mouth daily., Disp: , Rfl:    Medications  ordered in this encounter:  Meds ordered this encounter  Medications   benzonatate (TESSALON) 100 MG capsule    Sig: Take 1 capsule (100 mg total) by mouth 3 (three) times daily as needed for cough.    Dispense:  30 capsule    Refill:  0    Order Specific Question:   Supervising Provider    Answer:   Sabra Heck, BRIAN [[id number]]   molnupiravir EUA 200 mg CAPS    Sig: Take 4 capsules (800 mg total) by mouth 2 (two) times daily for 5 days.    Dispense:  40 capsule    Refill:  0    Order Specific Question:   Supervising Provider    Answer:   Sabra Heck, [place]     *If you need refills on other medications prior to your next appointment, please contact your pharmacy*  Follow-Up: Call back or seek an in-person evaluation if the symptoms worsen or if the condition fails to improve as anticipated.  Other Instructions Please keep well-hydrated and get plenty of rest. Start a saline nasal rinse to flush out your nasal passages. You can use plain Mucinex to help thin congestion. If you have a humidifier, running in the bedroom at night. I want you to start OTC vitamin D3 1000 units daily, vitamin C 1000 mg daily, and a zinc supplement. Please take prescribed medications as directed.  You have been enrolled in a MyChart symptom monitoring program. Please answer these questions daily so we can keep track of how you are doing.  You were to quarantine for 5 days from onset of your symptoms.  After day 5, if you have had no fever and you are feeling better, you can end quarantine but need to mask for an additional 5 days. After day 5 if you have a fever or are having significant symptoms, please quarantine for full 10 days.  If you note any worsening of symptoms, any significant shortness of breath or any chest pain, please seek ER evaluation ASAP.  Please do not delay care!    If you have been instructed to have an in-person evaluation today at a local Urgent Care facility, please use the link  below. It will take you to a list of all of our available [facility], including address, phone number and hours of operation. Please do not delay care.  [facility]  If you or a family member do not have a primary care provider, use the link below to schedule a visit and establish care. When you choose a [facility] primary care physician or advanced practice provider, you gain a long-term partner in health. Find a Primary Care Provider  Learn more about [facility]'s in-office and virtual care options: [facility]

## 2021-06-22 NOTE — Progress Notes (Signed)
Virtual Visit Consent   Gardiner Werden, you are scheduled for a virtual visit with a [facility] provider today.     Just as with appointments in the office, your consent must be obtained to participate.  Your consent will be active for this visit and any virtual visit you may have with one of our providers in the next 365 days.     If you have a MyChart account, a copy of this consent can be sent to you electronically.  All virtual visits are billed to your insurance company just like a traditional visit in the office.    As this is a virtual visit, video technology does not allow for your provider to perform a traditional examination.  This may limit your provider's ability to fully assess your condition.  If your provider identifies any concerns that need to be evaluated in person or the need to arrange testing (such as labs, EKG, etc.), we will make arrangements to do so.     Although advances in technology are sophisticated, we cannot ensure that it will always work on either your end or our end.  If the connection with a video visit is poor, the visit may have to be switched to a telephone visit.  With either a video or telephone visit, we are not always able to ensure that we have a secure connection.     I need to obtain your verbal consent now.   Are you willing to proceed with your visit today?    Lonzie Reuss has provided verbal consent on 06/22/2021 for a virtual visit (video or telephone).   Leeanne Rio, [place]   Date: 06/22/2021 7:56 PM   Virtual Visit via Video Note   I, Leeanne Rio, connected with  Kedus Drouhard  ([id number], 27-Jul-1931) on 06/22/21 at  7:30 PM EDT by a video-enabled telemedicine application and verified that I am speaking with the correct person using two identifiers.  Location: Patient: Virtual Visit Location Patient: Home Provider: Virtual Visit Location Provider: Home Office   I discussed the limitations of evaluation and  management by telemedicine and the availability of in person appointments. The patient expressed understanding and agreed to proceed.    History of Present Illness: Phillip Neal is a 87 y.o. who identifies as a male who was assigned male at birth, and is being seen today for COVID-19.  Patient endorses symptoms starting yesterday morning with fatigue, nasal congestion and cough.  Denies any sinus pain, ear pain or tooth pain.  Denies chest pain or shortness of breath.  Has noted some mild chest wall tenderness with coughing spells.  Wife also with similar symptoms, both testing positive for COVID today.  Is not currently taking anything for symptoms.  Wife was just seen by this provider and started on antiviral.  He is wondering if he should also start antiviral medication.  HPI: HPI  Problems:  Patient Active Problem List   Diagnosis Date Noted   Shortness of breath 01/21/2021   Syncope and collapse 01/01/2021   Vertigo    Hypertension    Hyperlipidemia    History of kidney stones    Diabetes mellitus without complication (HCC)    Cough    Cancer ([place])    Dyspnea on exertion 04/01/2020   Asbestos exposure 04/01/2020   Atopic dermatitis 01/08/2020   Cardiomyopathy ([place]) 01/08/2020   Strain of knee 11/25/2019   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   Chronic rhinitis 11/07/2018   History  of iron deficiency 05/28/2018   GERD without esophagitis 05/01/2018   OA (osteoarthritis) of hip 01/31/2018   Atypical chest pain 09/27/2017   Elevated prostate specific antigen (PSA) 03/22/2016   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/11/2016   CKD (chronic kidney disease), stage III ([place]) 01/11/2016   Diabetes mellitus with stage 3 chronic kidney disease ([place]) 01/11/2016   Essential hypertension 01/11/2016   Mixed hyperlipidemia 01/11/2016    Allergies:  Allergies  Allergen Reactions   Ciprofloxacin Other (See Comments)    Unknown   Other     BEE STINGS AND YELLOW  JACKETS HAVE REQUIRED MEDICAL TREATMENT IN PAST   Statins Other (See Comments)    Unknown   Sulfamethoxazole Itching and Rash   Medications:  Current Outpatient Medications:    benzonatate (TESSALON) 100 MG capsule, Take 1 capsule (100 mg total) by mouth 3 (three) times daily as needed for cough., Disp: 30 capsule, Rfl: 0   molnupiravir EUA 200 mg CAPS, Take 4 capsules (800 mg total) by mouth 2 (two) times daily for 5 days., Disp: 40 capsule, Rfl: 0   aspirin EC 81 MG tablet, Take 81 mg by mouth daily., Disp: , Rfl:    clotrimazole-betamethasone (LOTRISONE) cream, Apply 1 application topically 2 (two) times daily as needed (for groin rash/itch). , Disp: , Rfl:    Coenzyme Q10 (CO Q 10) 100 MG CAPS, Take 100 mg by mouth 2 (two) times daily., Disp: , Rfl:    dicyclomine (BENTYL) 10 MG capsule, Take 10 mg by mouth as needed for cramping., Disp: , Rfl:    ezetimibe (ZETIA) 10 MG tablet, Take 1 tablet (10 mg total) by mouth daily., Disp: 90 tablet, Rfl: 3   fluticasone (FLONASE) 50 MCG/ACT nasal spray, Place 2 sprays into both nostrils daily as needed for allergies or rhinitis., Disp: , Rfl:    hydrochlorothiazide (HYDRODIURIL) 12.5 MG tablet, Take 12.5 mg by mouth daily. , Disp: , Rfl:    lisinopril (PRINIVIL,ZESTRIL) 40 MG tablet, Take 40 mg by mouth daily. , Disp: , Rfl:    metFORMIN (GLUCOPHAGE-XR) 500 MG 24 hr tablet, Take 1,000 mg by mouth 2 (two) times daily. , Disp: , Rfl:    metoprolol succinate (TOPROL XL) 25 MG 24 hr tablet, Take 1 tablet (25 mg total) by mouth daily., Disp: 90 tablet, Rfl: 2   vitamin B-12 (CYANOCOBALAMIN) 1000 MCG tablet, Take 1,000 mcg by mouth daily., Disp: , Rfl:   Observations/Objective: Patient is well-developed, well-nourished in no acute distress.  Resting comfortable at home.  Head is normocephalic, atraumatic.  No labored breathing. Speech is clear and coherent with logical content.  Patient is alert and oriented at baseline.   Assessment and Plan: 1.  COVID-19 - benzonatate (TESSALON) 100 MG capsule; Take 1 capsule (100 mg total) by mouth 3 (three) times daily as needed for cough.  Dispense: 30 capsule; Refill: 0 - molnupiravir EUA 200 mg CAPS; Take 4 capsules (800 mg total) by mouth 2 (two) times daily for 5 days.  Dispense: 40 capsule; Refill: 0 - MyChart COVID-19 home monitoring program; Future  Patient with multiple risk factors for complicated course of illness. Discussed risks/benefits of antiviral medications including most common potential ADRs. Patient voiced understanding and would like to proceed with antiviral medication. They are candidate for molnupiravir. Rx sent to pharmacy. Supportive measures, OTC medications and vitamin regimen reviewed. Tessalon per orders. Patient has been enrolled in a MyChart COVID symptom monitoring program. Samule Dry reviewed in detail. Strict ER precautions discussed with  patient.    Follow Up Instructions: I discussed the assessment and treatment plan with the patient. The patient was provided an opportunity to ask questions and all were answered. The patient agreed with the plan and demonstrated an understanding of the instructions.  A copy of instructions were sent to the patient via MyChart.  The patient was advised to call back or seek an in-person evaluation if the symptoms worsen or if the condition fails to improve as anticipated.  Time:  I spent 20 minutes with the patient via telehealth technology discussing the above problems/concerns.    Leeanne Rio, PA-C

## 2021-08-13 DIAGNOSIS — M791 Myalgia, unspecified site: Secondary | ICD-10-CM | POA: Insufficient documentation

## 2021-08-13 HISTORY — DX: Myalgia, unspecified site: M79.10

## 2021-08-15 DIAGNOSIS — D649 Anemia, unspecified: Secondary | ICD-10-CM | POA: Insufficient documentation

## 2021-08-15 HISTORY — DX: Anemia, unspecified: D64.9

## 2021-12-06 ENCOUNTER — Ambulatory Visit (INDEPENDENT_AMBULATORY_CARE_PROVIDER_SITE_OTHER): Payer: Medicare Other | Admitting: Cardiology

## 2021-12-06 ENCOUNTER — Encounter: Payer: Self-pay | Admitting: Cardiology

## 2021-12-06 ENCOUNTER — Other Ambulatory Visit: Payer: Self-pay

## 2021-12-06 VITALS — BP 124/70 | HR 60 | Ht 69.75 in | Wt 225.2 lb

## 2021-12-06 VITALS — BP 134/62 | HR 77 | Ht 69.75 in | Wt 225.2 lb

## 2021-12-06 DIAGNOSIS — R55 Syncope and collapse: Secondary | ICD-10-CM

## 2021-12-06 DIAGNOSIS — I10 Essential (primary) hypertension: Secondary | ICD-10-CM | POA: Diagnosis not present

## 2021-12-06 DIAGNOSIS — E782 Mixed hyperlipidemia: Secondary | ICD-10-CM | POA: Diagnosis not present

## 2021-12-06 DIAGNOSIS — R0602 Shortness of breath: Secondary | ICD-10-CM

## 2021-12-06 DIAGNOSIS — I452 Bifascicular block: Secondary | ICD-10-CM | POA: Diagnosis not present

## 2021-12-06 NOTE — Progress Notes (Signed)
Cardiology Office Note:    Date:  12/06/2021   ID:  Phillip Neal, DOB 06/10/1931, MRN [id number]  PCP:  Raina Mina., MD  Cardiologist:  Jenne Campus, MD    Referring MD: Raina Mina., MD   Chief Complaint  Patient presents with   Follow-up  I am doing fine  History of Present Illness:    Phillip Neal is a 88 y.o. male  with past medical history significant for trifascicular block, essential hypertension, atypical chest pain with stress test being negative done just last year, dyslipidemia, dyspnea on exertion.  Previously we concentrated our work-up on dyspnea exertion have been unrevealing from cardiac standpoint of view echocardiogram showed preserved ejection fraction, stress test no showed no evidence of ischemia.  He came to the office today because he did have episode of syncope he apparently got up in the morning went to the kitchen did some work that he come back and he sits in the recliner he was talking to his wife for a while then he check some phone and then She knows his wife is kind of shaking him and waking him up.  According to his wife she was trying to get his attention but he was not responding when she got up and went to him and started shaking when he woke up.  What is the most worrisome out of the center history is the fact that he wet himself when it happened.  After that he got up he went to the kitchen was able to make breakfast.  Since that time I put Zio patch on him.  Monitor did not show any critical bradycardia episodes of nonconducted P wave which resulted in pauses up to 3.1 seconds but those were asymptomatic.  Since the time I seen him he did not have any more chest pain tightness squeezing pressure burning chest dizziness or passing out. He is busy taking care of his wife who does have reactivation of breast cancer.  Of course he is very concerned about it.  They have been married together for 43 years.  Therefore, he had no chance to  exercise since have seen him last time. He eventually was referred to EP team, he felt not to be candidate for pacemaker, encounter he was put on beta-blocker which seems to be working well.  Comes today to my office for follow-up.  Overall he is doing quite well still taking care of his sick wife.  Described to have momentary dizziness but not to the point of passing out.  But still able to Do a daycare of activities daily living with no difficulty.  He complained about the fact that he does not exercise and he thinks this is because he have to take care of his wife.  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 EF 50-55%. Dionis Autry.  Scar on ST 2019. PVC on monitor.   Chronic rhinitis 11/07/2018   CKD (chronic kidney disease), stage III ([place]) 01/11/2016   Cough last week   no phlegm jill dr Wynelle Link pa aware   Diabetes mellitus  with stage 3 chronic kidney disease ([place]) 01/11/2016   Diabetes mellitus without complication (HCC)    Dyspnea on exertion 04/01/2020   Elevated prostate specific antigen (PSA) 03/22/2016   Formatting of this note might be different from the original. No longer following.   Essential hypertension 01/11/2016   GERD without esophagitis 05/01/2018   History of iron deficiency 05/28/2018   Formatting of this note might be different from the original. After surgeries. Follow. No longer anemic.   History of kidney stones    years ago, passed on own   Hyperlipidemia    Hypertension    Mixed hyperlipidemia 01/11/2016   Myalgia due to statin 08/13/2021   OA (osteoarthritis) of hip 01/31/2018   Primary osteoarthritis  involving multiple joints 07/02/2019   Shortness of breath 01/21/2021   Strain of knee 11/25/2019   Syncope and collapse 01/01/2021   Vertigo    jan  2019 surgery rescheduled ent dr Laurance Fl[place] no vertigo since end of january    Past Surgical History:  Procedure Laterality Date   colonscopy     with polyp removal   HERNIA REPAIR  1954   left inguinal hernia   TOTAL HIP ARTHROPLASTY Left 01/31/2018   Procedure: LEFT TOTAL HIP ARTHROPLASTY ANTERIOR APPROACH;  Surgeon: Gaynelle Arabian, MD;  Location: [facility];  Service: Orthopedics;  Laterality: Left;   TOTAL HIP ARTHROPLASTY Right 05/16/2018   Procedure: RIGHT TOTAL HIP ARTHROPLASTY ANTERIOR APPROACH;  Surgeon: Gaynelle Arabian, MD;  Location: [facility];  Service: Orthopedics;  Laterality: Right;    Current Medications: Current Meds  Medication Sig   aspirin EC 81 MG tablet Take 81 mg by mouth daily.   Cholecalciferol (VITAMIN D3) 25 MCG (1000 UT) CAPS Take 1 capsule by mouth daily.   Coenzyme Q10 (CO Q 10) 100 MG CAPS Take 100 mg by mouth 2 (two) times daily.   dicyclomine (BENTYL) 10 MG capsule Take 10 mg by mouth as needed for cramping.   dicyclomine (BENTYL) 10 MG capsule Take 10 mg by mouth every 6 (six) hours.   ezetimibe (ZETIA) 10 MG tablet Take 1 tablet (10 mg total) by mouth daily.   fluticasone (FLONASE) 50 MCG/ACT nasal spray Place 2 sprays into both nostrils daily as needed for allergies or rhinitis.   hydrochlorothiazide (HYDRODIURIL) 12.5 MG tablet Take 12.5 mg by mouth daily.    lisinopril (PRINIVIL,ZESTRIL) 40 MG tablet Take 40 mg by mouth daily.    metFORMIN (GLUCOPHAGE-XR) 500 MG 24 hr tablet Take 1,000 mg by mouth 2 (two) times daily.    metoprolol succinate (TOPROL XL) 25 MG 24 hr tablet Take 1 tablet (25 mg total) by mouth daily.   vitamin B-12 (CYANOCOBALAMIN) 1000 MCG tablet Take 1,000 mcg by mouth daily.     Allergies:   Ciprofloxacin, Other, Statins, and Sulfamethoxazole   Social History   Socioeconomic History    Marital status: Married    Spouse name: Not on file   Number of children: Not on file   Years of education: Not on file   Highest education level: Not on file  Occupational History   Not on file  Tobacco Use   Smoking status: Never   Smokeless tobacco: Never  Vaping Use   Vaping Use: Never used  Substance and Sexual Activity   Alcohol use: Never   Drug use: Never   Sexual activity: Not on file  Other Topics Concern   Not on file  Social History Narrative   Not on file  Social Determinants of Health   Financial Resource Strain: Not on file  Food Insecurity: Not on file  Transportation Needs: Not on file  Physical Activity: Not on file  Stress: Not on file  Social Connections: Not on file     Family History: [name] family history includes Heart failure in his mother. ROS:   Please see the history of present illness.    All 14 point review of systems negative except as described per history of present illness  EKGs/Labs/Other Studies Reviewed:      Recent Labs: No results found for requested labs within last 8760 hours.  Recent Lipid Panel No results found for: CHOL, TRIG, HDL, CHOLHDL, VLDL, LDLCALC, LDLDIRECT  Physical Exam:    VS:  BP 134/62 (BP Location: Left Arm, Patient Position: Sitting)    Pulse 77    Ht 5' 9.75" (1.772 m)    Wt 225 lb 3.2 oz (102.2 kg)    SpO2 97%    BMI 32.54 kg/m     Wt Readings from Last 3 Encounters:  12/06/21 225 lb 3.2 oz (102.2 kg)  05/12/21 226 lb (102.5 kg)  03/29/21 222 lb 6.4 oz (100.9 kg)     GEN:  Well nourished, well developed in no acute distress HEENT: Normal NECK: No JVD; No carotid bruits LYMPHATICS: No lymphadenopathy CARDIAC: RRR, no murmurs, no rubs, no gallops RESPIRATORY:  Clear to auscultation without rales, wheezing or rhonchi  ABDOMEN: Soft, non-tender, non-distended MUSCULOSKELETAL:  No edema; No deformity  SKIN: Warm and dry LOWER EXTREMITIES: no swelling NEUROLOGIC:  Alert and oriented x  3 PSYCHIATRIC:  Normal affect   ASSESSMENT:    1. Bifascicular bundle branch block   2. Essential hypertension   3. Primary hypertension   4. Mixed hyperlipidemia   5. Syncope and collapse    PLAN:    In order of problems listed above:  Bifascicular bundle branch block.  Stable from that point review no new symptoms.  We will consider putting a monitor on him. Essential hypertension blood pressure seems to be well controlled continue present management. Dyslipidemia I did review lab work test done by primary care physician he is direct LDL is 73 which is acceptable continue present management. Syncope and collapse: Denies having any.   Medication Adjustments/Labs and Tests Ordered: Current medicines are reviewed at length with the patient today.  Concerns regarding medicines are outlined above.  No orders of the defined types were placed in this encounter.  Medication changes: No orders of the defined types were placed in this encounter.   Signed, Park Liter, MD, [place] 12/06/2021 2:57 PM    [facility]

## 2021-12-06 NOTE — Patient Instructions (Signed)
Medication Instructions:  Your physician recommends that you continue on your current medications as directed. Please refer to the Current Medication list given to you today.  *If you need a refill on your cardiac medications before your next appointment, please call your pharmacy*   Lab Work: BNP today If you have labs (blood work) drawn today and your tests are completely normal, you will receive your results only by: [place] (if you have MyChart) OR A paper copy in the mail If you have any lab test that is abnormal or we need to change your treatment, we will call you to review the results.   Testing/Procedures: None ordered   Follow-Up: At [facility], you and your health needs are our priority.  As part of our continuing mission to provide you with exceptional heart care, we have created designated Provider Care Teams.  These Care Teams include your primary Cardiologist (physician) and Advanced Practice Providers (APPs -  Physician Assistants and Nurse Practitioners) who all work together to provide you with the care you need, when you need it.  Your next appointment:   1 year(s)  The format for your next appointment:   In Person  Provider:   Allegra Lai, MD    Thank you for choosing [facility]!!   Trinidad Curet, RN [phone]

## 2021-12-06 NOTE — Patient Instructions (Signed)

## 2021-12-06 NOTE — Progress Notes (Signed)
Electrophysiology Office Note   Date:  12/06/2021   ID:  Phillip Neal, DOB 03/14/1931, MRN [id number]  PCP:  Raina Mina., MD  Cardiologist: Agustin Cree Primary Electrophysiologist:  Maja Mccaffery Meredith Leeds, MD    Chief Complaint: syncope, palpitations   History of Present Illness: Phillip Neal is a 88 y.o. male who is being seen today for the evaluation of syncope, palpitations at the request of Raina Mina., MD. Presenting today for electrophysiology evaluation.  He has a history significant for trifascicular block, hypertension, hyperlipidemia, dyspnea on exertion.  He had an episode of syncope.  This occurred when he got up in the morning and went to the kitchen.  He sat down in his recliner.  His wife tried to wake him but he did not respond.  He had loss of bladder function.  He wore a ZIO monitor that showed no obvious cause for syncope.  After wearing the monitor he had a second episode of syncope.  He got up from sleeping at 5:30 in the morning.  He stood up and had dizziness and weakness.  This lasted a few minutes.  He had a third episodes of palpitations and dizziness as well as weakness.  Due to palpitations and tachycardia noted on monitor, he was started on Toprol-XL.  Today, denies symptoms of palpitations, chest pain, shortness of breath, PND, lower extremity edema, claudication, dizziness, presyncope, syncope, bleeding, or neurologic sequela. The patient is tolerating medications without difficulties.  He overall feels well.  He is able to exert himself without issue.  He works in his yard for up to 5 hours every few days.  Unfortunately his wife is sick with breast cancer and he is taking care of her most of the time.  He does complain of shortness of breath.  His shortness of breath caused him to feel like he needs to take a deep breath.  He has been sleeping in a recliner as he gets short of breath when he lays down flat.  He has to sit up at times to catch his  breath, or take a deep breath.   Past Medical History:  Diagnosis Date   Anemia of unknown etiology 08/15/2021   Arthritis    oa bo hips   Asbestos exposure 04/01/2020   Atopic dermatitis 01/08/2020   Atypical chest pain 09/27/2017   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/11/2016   Cancer ([place])    squamous cell removed 3 yrs ago from upper chest by neck   Cardiomyopathy ([place]) 01/08/2020   Formatting of this note might be different from the original. EF 50-55%. Krasowski.  Scar on ST 2019. PVC on monitor.   Chronic rhinitis 11/07/2018   CKD (chronic kidney disease), stage III ([place]) 01/11/2016   Cough last week   no phlegm jill dr Wynelle Link pa aware   Diabetes mellitus with stage 3 chronic kidney disease ([place]) 01/11/2016   Diabetes mellitus without complication ([place])    Dyspnea on exertion 04/01/2020   Elevated prostate specific antigen (PSA) 03/22/2016   Formatting of this note might be different from the original. No longer following.   Essential hypertension 01/11/2016   GERD without esophagitis 05/01/2018   History of iron deficiency 05/28/2018   Formatting of this note might be different from the original. After surgeries. Follow. No longer anemic.   History of kidney stones    years ago, passed on own   Hyperlipidemia    Hypertension    Mixed hyperlipidemia 01/11/2016   Myalgia due to  statin 08/13/2021   OA (osteoarthritis) of hip 01/31/2018   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   Shortness of breath 01/21/2021   Strain of knee 11/25/2019   Syncope and collapse 01/01/2021   Vertigo    jan  2019 surgery rescheduled ent dr Laurance Flatten [place] no vertigo since end of january   Past Surgical History:  Procedure Laterality Date   colonscopy     with polyp removal   HERNIA REPAIR  1954   left inguinal hernia   TOTAL HIP ARTHROPLASTY Left 01/31/2018   Procedure: LEFT TOTAL HIP ARTHROPLASTY ANTERIOR APPROACH;  Surgeon: Gaynelle Arabian, MD;  Location: [facility];  Service: Orthopedics;   Laterality: Left;   TOTAL HIP ARTHROPLASTY Right 05/16/2018   Procedure: RIGHT TOTAL HIP ARTHROPLASTY ANTERIOR APPROACH;  Surgeon: Gaynelle Arabian, MD;  Location: [facility];  Service: Orthopedics;  Laterality: Right;     Current Outpatient Medications  Medication Sig Dispense Refill   aspirin EC 81 MG tablet Take 81 mg by mouth daily.     Cholecalciferol (VITAMIN D3) 25 MCG (1000 UT) CAPS Take 1 capsule by mouth daily.     Coenzyme Q10 (CO Q 10) 100 MG CAPS Take 100 mg by mouth 2 (two) times daily.     dicyclomine (BENTYL) 10 MG capsule Take 10 mg by mouth as needed for cramping.     dicyclomine (BENTYL) 10 MG capsule Take 10 mg by mouth every 6 (six) hours.     ezetimibe (ZETIA) 10 MG tablet Take 1 tablet (10 mg total) by mouth daily. 90 tablet 3   fluticasone (FLONASE) 50 MCG/ACT nasal spray Place 2 sprays into both nostrils daily as needed for allergies or rhinitis.     hydrochlorothiazide (HYDRODIURIL) 12.5 MG tablet Take 12.5 mg by mouth daily.      lisinopril (PRINIVIL,ZESTRIL) 40 MG tablet Take 40 mg by mouth daily.      metFORMIN (GLUCOPHAGE-XR) 500 MG 24 hr tablet Take 1,000 mg by mouth 2 (two) times daily.      metoprolol succinate (TOPROL XL) 25 MG 24 hr tablet Take 1 tablet (25 mg total) by mouth daily. 90 tablet 2   vitamin B-12 (CYANOCOBALAMIN) 1000 MCG tablet Take 1,000 mcg by mouth daily.     No current facility-administered medications for this visit.    Allergies:   Ciprofloxacin, Other, Statins, and Sulfamethoxazole   Social History:  The patient  reports that he has never smoked. He has never used smokeless tobacco. He reports that he does not drink alcohol and does not use drugs.   Family History:  [name] family history includes Heart failure in his mother.   ROS:  Please see the history of present illness.   Otherwise, review of systems is positive for none.   All other systems are reviewed and negative.   PHYSICAL EXAM: VS:  BP 124/70    Pulse 60    Ht 5'  9.75" (1.772 m)    Wt 225 lb 3.2 oz (102.2 kg)    SpO2 97%    BMI 32.54 kg/m  , BMI Body mass index is 32.54 kg/m. GEN: Well nourished, well developed, in no acute distress  HEENT: normal  Neck: no JVD, carotid bruits, or masses Cardiac: RRR; no murmurs, rubs, or gallops,no edema  Respiratory:  clear to auscultation bilaterally, normal work of breathing GI: soft, nontender, nondistended, + BS MS: no deformity or atrophy  Skin: warm and dry Neuro:  Strength and sensation are intact Psych: euthymic  mood, full affect  EKG:  EKG is ordered today. Personal review of the ekg ordered shows sinus rhythm, right bundle branch block, first-degree AV block, left anterior fascicular block, PVC  Recent Labs: No results found for requested labs within last 8760 hours.    Lipid Panel  No results found for: CHOL, TRIG, HDL, CHOLHDL, VLDL, LDLCALC, LDLDIRECT   Wt Readings from Last 3 Encounters:  12/06/21 225 lb 3.2 oz (102.2 kg)  12/06/21 225 lb 3.2 oz (102.2 kg)  05/12/21 226 lb (102.5 kg)      Other studies Reviewed: Additional studies/ records that were reviewed today include: TTE 03/02/20  Review of the above records today demonstrates:   1. Left ventricular ejection fraction, by estimation, is 60 to 65%. The  left ventricle has normal function. The left ventricle has no regional  wall motion abnormalities. There is mild concentric left ventricular  hypertrophy. Left ventricular diastolic  parameters are consistent with Grade I diastolic dysfunction (impaired  relaxation).   2. Right ventricular systolic function is normal. The right ventricular  size is normal. There is normal pulmonary artery systolic pressure.   3. The mitral valve is normal in structure. No evidence of mitral valve  regurgitation. No evidence of mitral stenosis.   4. The aortic valve is normal in structure. Aortic valve regurgitation is  mild. Mild aortic valve sclerosis is present, with no evidence of aortic   valve stenosis.   5. The inferior vena cava is dilated in size with <50% respiratory  variability, suggesting right atrial pressure of 15 mmHg.   Cardiac monitor 02/12/2021 personally reviewed Episode first-degree AV block as well as second-degree type I AV block that resulted with pauses up to 3.2 seconds.  Symptomatic PVCs noted    ASSESSMENT AND PLAN:  1.  Syncope: Currently on Toprol-XL.  He has fortunately had no further episodes of syncope.  He is overall happy with his control and without complaint.  No changes at this time.  2.  Hypertension: Currently well controlled.  No changes at this time.  3.  Shortness of breath: Unclear as to the cause of his shortness of breath.  He has very typical orthopnea, but no evidence of volume overload on exam.  He had an echo in 2021 with a normal ejection fraction.  We Mycheal Veldhuizen check a BNP today.  Case discussed with primary cardiology  Current medicines are reviewed at length with the patient today.   The patient does not have concerns regarding his medicines.  The following changes were made today: None  Labs/ tests ordered today include:  Orders Placed This Encounter  Procedures   Pro b natriuretic peptide (BNP)   EKG 12-Lead     Disposition:   FU with Janda Cargo 12 months  Signed, Aryam Zhan Meredith Leeds, MD  12/06/2021 3:40 PM     [facility] [address] [place] [address] [phone] (office) [phone] (fax)

## 2021-12-07 LAB — PRO B NATRIURETIC PEPTIDE: NT-Pro BNP: 160 pg/mL (ref 0–486)

## 2021-12-18 ENCOUNTER — Other Ambulatory Visit: Payer: Self-pay | Admitting: Cardiology

## 2022-02-14 DIAGNOSIS — B351 Tinea unguium: Secondary | ICD-10-CM | POA: Insufficient documentation

## 2022-02-14 HISTORY — DX: Tinea unguium: B35.1

## 2022-07-28 DIAGNOSIS — I872 Venous insufficiency (chronic) (peripheral): Secondary | ICD-10-CM | POA: Insufficient documentation

## 2022-07-28 HISTORY — DX: Venous insufficiency (chronic) (peripheral): I87.2

## 2022-08-08 ENCOUNTER — Ambulatory Visit (INDEPENDENT_AMBULATORY_CARE_PROVIDER_SITE_OTHER): Payer: Medicare Other | Admitting: Cardiology

## 2022-08-08 ENCOUNTER — Ambulatory Visit: Payer: Medicare Other | Attending: Cardiology | Admitting: Cardiology

## 2022-08-08 ENCOUNTER — Encounter: Payer: Self-pay | Admitting: Cardiology

## 2022-08-08 VITALS — BP 138/76 | HR 76 | Ht 69.75 in | Wt 222.6 lb

## 2022-08-08 VITALS — BP 138/76 | HR 64 | Ht 69.75 in | Wt 222.6 lb

## 2022-08-08 DIAGNOSIS — I452 Bifascicular block: Secondary | ICD-10-CM

## 2022-08-08 DIAGNOSIS — R55 Syncope and collapse: Secondary | ICD-10-CM | POA: Insufficient documentation

## 2022-08-08 DIAGNOSIS — E782 Mixed hyperlipidemia: Secondary | ICD-10-CM

## 2022-08-08 DIAGNOSIS — R0609 Other forms of dyspnea: Secondary | ICD-10-CM

## 2022-08-08 DIAGNOSIS — I10 Essential (primary) hypertension: Secondary | ICD-10-CM

## 2022-08-08 DIAGNOSIS — R0602 Shortness of breath: Secondary | ICD-10-CM

## 2022-08-08 NOTE — Progress Notes (Signed)
Cardiology Office Note:    Date:  08/08/2022   ID:  Phillip Neal, DOB 26-Sep-1931, MRN [id number]  PCP:  Raina Mina., MD  Cardiologist:  Jenne Campus, MD    Referring MD: Raina Mina., MD   Chief Complaint  Patient presents with   Follow-up    History of Present Illness:    Phillip Neal is a 88 y.o. male   with past medical history significant for trifascicular block, essential hypertension, atypical chest pain with stress test being negative done just last year, dyslipidemia, dyspnea on exertion.  Previously we concentrated our work-up on dyspnea exertion have been unrevealing from cardiac standpoint of view echocardiogram showed preserved ejection fraction, stress test no showed no evidence of ischemia.  He came to the office today because he did have episode of syncope he apparently got up in the morning went to the kitchen did some work that he come back and he sits in the recliner he was talking to his wife for a while then he check some phone and then She knows his wife is kind of shaking him and waking him up.  According to his wife she was trying to get his attention but he was not responding when she got up and went to him and started shaking when he woke up.  What is the most worrisome out of the center history is the fact that he wet himself when it happened.  After that he got up he went to the kitchen was able to make breakfast.  Since that time I put Zio patch on him.  Monitor did not show any critical bradycardia episodes of nonconducted P wave which resulted in pauses up to 3.1 seconds but those were asymptomatic.  Since the time I seen him he did not have any more chest pain tightness squeezing pressure burning chest dizziness or passing out. He is coming today to my office for follow-up tragedy strikes his wife that he has been married for 4 years died.  He seems to be coping with this addition quite well, he got 3 daughter who is living close by and they  taking care of him and helping him.  Described to have some dizziness recently hydrochlorothiazide has been discontinued dizziness better no passing out.  He did notice some swelling of lower extremities.  He also noticed some palpitations  Past Medical History:  Diagnosis Date   Anemia of unknown etiology 08/15/2021   Arthritis    oa bo hips   Asbestos exposure 04/01/2020   Atopic dermatitis 01/08/2020   Atypical chest pain 09/27/2017   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/11/2016   Cancer ([place])    squamous cell removed 3 yrs ago from upper chest by neck   Cardiomyopathy ([place]) 01/08/2020   Formatting of this note might be different from the original. EF 50-55%. [name].  Scar on ST 2019. PVC on monitor.   Chronic rhinitis 11/07/2018   CKD (chronic kidney disease), stage III ([place]) 01/11/2016   Cough last week   no phlegm jill dr Wynelle Link pa aware   Diabetes mellitus with stage 3 chronic kidney disease ([place]) 01/11/2016   Diabetes mellitus without complication ([place])    Dyspnea on exertion 04/01/2020   Elevated prostate specific antigen (PSA) 03/22/2016   Formatting of this note might be different from the original. No longer following.   Essential hypertension 01/11/2016   GERD without esophagitis 05/01/2018   History of iron deficiency 05/28/2018   Formatting of this note might  be different from the original. After surgeries. Follow. No longer anemic.   History of kidney stones    years ago, passed on own   Hyperlipidemia    Hypertension    Mixed hyperlipidemia 01/11/2016   Myalgia due to statin 08/13/2021   OA (osteoarthritis) of hip 01/31/2018   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   Shortness of breath 01/21/2021   Strain of knee 11/25/2019   Syncope and collapse 01/01/2021   Vertigo    jan  2019 surgery rescheduled ent dr Laurance Flatten [place] no vertigo since end of january    Past Surgical History:  Procedure Laterality Date   colonscopy     with polyp removal   HERNIA  REPAIR  1954   left inguinal hernia   TOTAL HIP ARTHROPLASTY Left 01/31/2018   Procedure: LEFT TOTAL HIP ARTHROPLASTY ANTERIOR APPROACH;  Surgeon: Gaynelle Arabian, MD;  Location: [facility];  Service: Orthopedics;  Laterality: Left;   TOTAL HIP ARTHROPLASTY Right 05/16/2018   Procedure: RIGHT TOTAL HIP ARTHROPLASTY ANTERIOR APPROACH;  Surgeon: Gaynelle Arabian, MD;  Location: [facility];  Service: Orthopedics;  Laterality: Right;    Current Medications: Current Meds  Medication Sig   aspirin EC 81 MG tablet Take 81 mg by mouth daily.   Cholecalciferol (VITAMIN D3) 25 MCG (1000 UT) CAPS Take 1 capsule by mouth daily.   Coenzyme Q10 (CO Q 10) 100 MG CAPS Take 100 mg by mouth 2 (two) times daily.   dicyclomine (BENTYL) 10 MG capsule Take 10 mg by mouth as needed for cramping.   ezetimibe (ZETIA) 10 MG tablet Take 1 tablet (10 mg total) by mouth daily.   fluticasone (FLONASE) 50 MCG/ACT nasal spray Place 2 sprays into both nostrils daily as needed for allergies or rhinitis.   Furosemide (LASIX PO) Take 1 tablet by mouth daily.   lisinopril (PRINIVIL,ZESTRIL) 40 MG tablet Take 40 mg by mouth daily.    metFORMIN (GLUCOPHAGE-XR) 500 MG 24 hr tablet Take 1,000 mg by mouth 2 (two) times daily.    metoprolol succinate (TOPROL-XL) 25 MG 24 hr tablet TAKE 1 TABLET BY MOUTH  DAILY (Patient taking differently: Take 25 mg by mouth daily.)   vitamin B-12 (CYANOCOBALAMIN) 1000 MCG tablet Take 1,000 mcg by mouth daily.   [DISCONTINUED] dicyclomine (BENTYL) 10 MG capsule Take 10 mg by mouth every 6 (six) hours.   [DISCONTINUED] hydrochlorothiazide (HYDRODIURIL) 12.5 MG tablet Take 12.5 mg by mouth daily.      Allergies:   Ciprofloxacin, Other, Statins, and Sulfamethoxazole   Social History   Socioeconomic History   Marital status: Married    Spouse name: Not on file   Number of children: Not on file   Years of education: Not on file   Highest education level: Not on file  Occupational History   Not on file   Tobacco Use   Smoking status: Never   Smokeless tobacco: Never  Vaping Use   Vaping Use: Never used  Substance and Sexual Activity   Alcohol use: Never   Drug use: Never   Sexual activity: Not on file  Other Topics Concern   Not on file  Social History Narrative   Not on file   Social Determinants of Health   Financial Resource Strain: Not on file  Food Insecurity: Not on file  Transportation Needs: Not on file  Physical Activity: Not on file  Stress: Not on file  Social Connections: Not on file     Family History: [name]  family history includes Heart failure in his mother. ROS:   Please see the history of present illness.    All 14 point review of systems negative except as described per history of present illness  EKGs/Labs/Other Studies Reviewed:      Recent Labs: 12/06/2021: NT-Pro BNP 160  Recent Lipid Panel No results found for: "CHOL", "TRIG", "HDL", "CHOLHDL", "VLDL", "LDLCALC", "LDLDIRECT"  Physical Exam:    VS:  BP 138/76 (BP Location: Left Arm, Patient Position: Sitting)   Pulse 76   Ht 5' 9.75" (1.772 m)   Wt 222 lb 9.6 oz (101 kg)   SpO2 98%   BMI 32.17 kg/m     Wt Readings from Last 3 Encounters:  08/08/22 222 lb 9.6 oz (101 kg)  12/06/21 225 lb 3.2 oz (102.2 kg)  12/06/21 225 lb 3.2 oz (102.2 kg)     GEN:  Well nourished, well developed in no acute distress HEENT: Normal NECK: No JVD; No carotid bruits LYMPHATICS: No lymphadenopathy CARDIAC: RRR, no murmurs, no rubs, no gallops RESPIRATORY:  Clear to auscultation without rales, wheezing or rhonchi  ABDOMEN: Soft, non-tender, non-distended MUSCULOSKELETAL:  No edema; No deformity  SKIN: Warm and dry LOWER EXTREMITIES: no swelling NEUROLOGIC:  Alert and oriented x 3 PSYCHIATRIC:  Normal affect   ASSESSMENT:    1. Essential hypertension   2. Bifascicular bundle branch block   3. Dyspnea on exertion   4. Shortness of breath   5. Mixed hyperlipidemia    PLAN:    In order  of problems listed above:  Dizziness denies having any significant right now feeling better and doing well Essential hypertension.  Blood pressure well controlled continue present management Bifascicular block.  He was seen by our EP colleagues medication for pacemaker Dyspnea on exertion with swelling of lower extremities I will ask him to have echocardiogram done a more obesity about stress-induced cardiomyopathy dyslipidemia, direct LDL 77.  We will continue present management which include Zetia   Medication Adjustments/Labs and Tests Ordered: Current medicines are reviewed at length with the patient today.  Concerns regarding medicines are outlined above.  No orders of the defined types were placed in this encounter.  Medication changes: No orders of the defined types were placed in this encounter.   Signed, Park Liter, MD, [facility] 08/08/2022 2:11 PM    [place]

## 2022-08-08 NOTE — Patient Instructions (Signed)

## 2022-08-08 NOTE — Progress Notes (Signed)
Electrophysiology Office Note   Date:  08/08/2022   ID:  Tymeer Vaquera, DOB Sep 24, 1931, MRN [id number]  PCP:  Raina Mina., MD  Cardiologist: Agustin Cree Primary Electrophysiologist:  Marcele Kosta Meredith Leeds, MD    Chief Complaint: syncope, palpitations   History of Present Illness: Phillip Neal is a 88 y.o. male who is being seen today for the evaluation of syncope, palpitations at the request of Raina Mina., MD. Presenting today for electrophysiology evaluation.  He has a history significant for trifascicular block, hypertension, hyperlipidemia, dyspnea on exertion.  He had an episode of syncope when he was in the kitchen with his wife.  He wore a Zio monitor that showed no obvious cause for syncope.  He did have nocturnal pauses.  Had another episode where he was sleeping at around 5:30 in the morning.  He stood up and felt dizzy and weak.  This lasted for a few minutes.  He had another episode where he felt dizziness and palpitations as well as weakness.  He was started on Toprol-XL.  He is continued to have episodes of dizziness.  Dizziness is not related to any position.  He is able to do all of his daily activities, though he does have to sit down and rest at times.  He does not have palpitations any longer.  Today, denies symptoms of palpitations, chest pain, shortness of breath, orthopnea, PND, lower extremity edema, claudication, dizziness, presyncope, syncope, bleeding, or neurologic sequela. The patient is tolerating medications without difficulties.    Past Medical History:  Diagnosis Date   Anemia of unknown etiology 08/15/2021   Arthritis    oa bo hips   Asbestos exposure 04/01/2020   Atopic dermatitis 01/08/2020   Atypical chest pain 09/27/2017   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/11/2016   Cancer ([place])    squamous cell removed 3 yrs ago from upper chest by neck   Cardiomyopathy ([place]) 01/08/2020   Formatting of this note might be different from the  original. EF 50-55%. Krasowski.  Scar on ST 2019. PVC on monitor.   Chronic rhinitis 11/07/2018   CKD (chronic kidney disease), stage III ([place]) 01/11/2016   Cough last week   no phlegm jill dr Wynelle Link pa aware   Diabetes mellitus with stage 3 chronic kidney disease ([place]) 01/11/2016   Diabetes mellitus without complication ([place])    Dyspnea on exertion 04/01/2020   Elevated prostate specific antigen (PSA) 03/22/2016   Formatting of this note might be different from the original. No longer following.   Essential hypertension 01/11/2016   GERD without esophagitis 05/01/2018   History of iron deficiency 05/28/2018   Formatting of this note might be different from the original. After surgeries. Follow. No longer anemic.   History of kidney stones    years ago, passed on own   Hyperlipidemia    Hypertension    Mixed hyperlipidemia 01/11/2016   Myalgia due to statin 08/13/2021   OA (osteoarthritis) of hip 01/31/2018   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   Shortness of breath 01/21/2021   Strain of knee 11/25/2019   Syncope and collapse 01/01/2021   Vertigo    jan  2019 surgery rescheduled ent dr Laurance Flatten [place] no vertigo since end of january   Past Surgical History:  Procedure Laterality Date   colonscopy     with polyp removal   HERNIA REPAIR  1954   left inguinal hernia   TOTAL HIP ARTHROPLASTY Left 01/31/2018   Procedure: LEFT TOTAL HIP ARTHROPLASTY  ANTERIOR APPROACH;  Surgeon: Gaynelle Arabian, MD;  Location: [facility];  Service: Orthopedics;  Laterality: Left;   TOTAL HIP ARTHROPLASTY Right 05/16/2018   Procedure: RIGHT TOTAL HIP ARTHROPLASTY ANTERIOR APPROACH;  Surgeon: Gaynelle Arabian, MD;  Location: [facility];  Service: Orthopedics;  Laterality: Right;     Current Outpatient Medications  Medication Sig Dispense Refill   aspirin EC 81 MG tablet Take 81 mg by mouth daily.     Cholecalciferol (VITAMIN D3) 25 MCG (1000 UT) CAPS Take 1 capsule by mouth daily.     Coenzyme Q10 (CO Q  10) 100 MG CAPS Take 100 mg by mouth 2 (two) times daily.     dicyclomine (BENTYL) 10 MG capsule Take 10 mg by mouth as needed for cramping.     ezetimibe (ZETIA) 10 MG tablet Take 1 tablet (10 mg total) by mouth daily. 90 tablet 3   fluticasone (FLONASE) 50 MCG/ACT nasal spray Place 2 sprays into both nostrils daily as needed for allergies or rhinitis.     Furosemide (LASIX PO) Take 20 mg by mouth daily.     lisinopril (PRINIVIL,ZESTRIL) 40 MG tablet Take 40 mg by mouth daily.      metFORMIN (GLUCOPHAGE-XR) 500 MG 24 hr tablet Take 1,000 mg by mouth 2 (two) times daily.      metoprolol succinate (TOPROL-XL) 25 MG 24 hr tablet TAKE 1 TABLET BY MOUTH  DAILY (Patient taking differently: Take 25 mg by mouth daily.) 90 tablet 3   vitamin B-12 (CYANOCOBALAMIN) 1000 MCG tablet Take 1,000 mcg by mouth daily.     No current facility-administered medications for this visit.    Allergies:   Ciprofloxacin, Other, Statins, and Sulfamethoxazole   Social History:  The patient  reports that he has never smoked. He has never used smokeless tobacco. He reports that he does not drink alcohol and does not use drugs.   Family History:  [name] family history includes Heart failure in his mother.   ROS:  Please see the history of present illness.   Otherwise, review of systems is positive for none.   All other systems are reviewed and negative.   PHYSICAL EXAM: VS:  BP 138/76   Pulse 64   Ht 5' 9.75" (1.772 m)   Wt 222 lb 9.6 oz (101 kg)   SpO2 98%   BMI 32.17 kg/m  , BMI Body mass index is 32.17 kg/m. GEN: Well nourished, well developed, in no acute distress  HEENT: normal  Neck: no JVD, carotid bruits, or masses Cardiac: RRR; no murmurs, rubs, or gallops,no edema  Respiratory:  clear to auscultation bilaterally, normal work of breathing GI: soft, nontender, nondistended, + BS MS: no deformity or atrophy  Skin: warm and dry Neuro:  Strength and sensation are intact Psych: euthymic mood, full  affect  EKG:  EKG is ordered today. Personal review of the ekg ordered shows sinus rhythm, trifascicular block  Recent Labs: 12/06/2021: NT-Pro BNP 160    Lipid Panel  No results found for: "CHOL", "TRIG", "HDL", "CHOLHDL", "VLDL", "LDLCALC", "LDLDIRECT"   Wt Readings from Last 3 Encounters:  08/08/22 222 lb 9.6 oz (101 kg)  08/08/22 222 lb 9.6 oz (101 kg)  12/06/21 225 lb 3.2 oz (102.2 kg)      Other studies Reviewed: Additional studies/ records that were reviewed today include: TTE 03/02/20  Review of the above records today demonstrates:   1. Left ventricular ejection fraction, by estimation, is 60 to 65%. The  left ventricle has  normal function. The left ventricle has no regional  wall motion abnormalities. There is mild concentric left ventricular  hypertrophy. Left ventricular diastolic  parameters are consistent with Grade I diastolic dysfunction (impaired  relaxation).   2. Right ventricular systolic function is normal. The right ventricular  size is normal. There is normal pulmonary artery systolic pressure.   3. The mitral valve is normal in structure. No evidence of mitral valve  regurgitation. No evidence of mitral stenosis.   4. The aortic valve is normal in structure. Aortic valve regurgitation is  mild. Mild aortic valve sclerosis is present, with no evidence of aortic  valve stenosis.   5. The inferior vena cava is dilated in size with <50% respiratory  variability, suggesting right atrial pressure of 15 mmHg.   Cardiac monitor 02/12/2021 personally reviewed Episode first-degree AV block as well as second-degree type I AV block that resulted with pauses up to 3.2 seconds.  Symptomatic PVCs noted    ASSESSMENT AND PLAN:  1.  Syncope: Currently on Toprol-XL.  He has trifascicular block, though he has not had any further episodes syncope.  He does have dizziness, based on history it does not sound that his dizziness is cardiac related.  We Keva Darty continue with  current management.  2.  Hypertension: Currently well controlled.  Being managed by primary physician.  Current medicines are reviewed at length with the patient today.   The patient does not have concerns regarding his medicines.  The following changes were made today: none  Labs/ tests ordered today include:  Orders Placed This Encounter  Procedures   EKG 12-Lead     Disposition:   FU with Chrishawna Farina 12 months  Signed, Jone Panebianco Meredith Leeds, MD  08/08/2022 3:22 PM     [address] [place] [id number] [phone] (office) [phone] (fax)

## 2022-08-08 NOTE — Addendum Note (Signed)
Addended by: Jacobo Forest D on: 08/08/2022 02:15 PM   Modules accepted: Orders

## 2022-08-17 ENCOUNTER — Ambulatory Visit: Payer: Medicare Other | Admitting: Cardiology

## 2022-08-19 ENCOUNTER — Ambulatory Visit: Payer: Medicare Other | Attending: Cardiology

## 2022-08-19 DIAGNOSIS — R0609 Other forms of dyspnea: Secondary | ICD-10-CM | POA: Diagnosis present

## 2022-08-20 LAB — ECHOCARDIOGRAM COMPLETE
Area-P 1/2: 3.06 cm2
P 1/2 time: 1497 msec
S' Lateral: 2.6 cm

## 2022-08-25 ENCOUNTER — Telehealth: Payer: Self-pay

## 2022-08-25 NOTE — Telephone Encounter (Signed)
Results sent and viewed by pt in My Chart. Routed to PCP.

## 2022-12-28 ENCOUNTER — Other Ambulatory Visit: Payer: Self-pay | Admitting: Cardiology

## 2023-01-18 DIAGNOSIS — R195 Other fecal abnormalities: Secondary | ICD-10-CM

## 2023-01-18 HISTORY — DX: Other fecal abnormalities: R19.5

## 2023-02-03 DIAGNOSIS — H903 Sensorineural hearing loss, bilateral: Secondary | ICD-10-CM | POA: Insufficient documentation

## 2023-02-03 HISTORY — DX: Sensorineural hearing loss, bilateral: H90.3

## 2023-02-06 ENCOUNTER — Encounter: Payer: Self-pay | Admitting: Cardiology

## 2023-02-06 ENCOUNTER — Ambulatory Visit: Payer: Medicare Other | Attending: Cardiology | Admitting: Cardiology

## 2023-02-06 ENCOUNTER — Ambulatory Visit (INDEPENDENT_AMBULATORY_CARE_PROVIDER_SITE_OTHER): Payer: Medicare Other

## 2023-02-06 VITALS — BP 150/80 | HR 60 | Ht 69.75 in | Wt 223.0 lb

## 2023-02-06 DIAGNOSIS — R55 Syncope and collapse: Secondary | ICD-10-CM | POA: Diagnosis present

## 2023-02-06 DIAGNOSIS — I10 Essential (primary) hypertension: Secondary | ICD-10-CM | POA: Diagnosis present

## 2023-02-06 DIAGNOSIS — R0609 Other forms of dyspnea: Secondary | ICD-10-CM | POA: Insufficient documentation

## 2023-02-06 DIAGNOSIS — I420 Dilated cardiomyopathy: Secondary | ICD-10-CM | POA: Insufficient documentation

## 2023-02-06 DIAGNOSIS — I452 Bifascicular block: Secondary | ICD-10-CM | POA: Diagnosis present

## 2023-02-06 MED ORDER — HYDROCHLOROTHIAZIDE 25 MG PO TABS: 25.0000 mg | ORAL_TABLET | Freq: Every day | ORAL | 3 refills | Status: DC

## 2023-02-06 NOTE — Progress Notes (Signed)
Cardiology Office Note:    Date:  02/06/2023   ID:  Phillip Neal, DOB 02/13/1931, MRN [id number]  PCP:  Raina Mina., MD  Cardiologist:  Jenne Campus, MD    Referring MD: Raina Mina., MD   Chief Complaint  Patient presents with   Follow-up    History of Present Illness:    Phillip Neal is a 89 y.o. male   with past medical history significant for trifascicular block, essential hypertension, atypical chest pain with stress test being negative done just last year, dyslipidemia, dyspnea on exertion.  Previously we concentrated our work-up on dyspnea exertion have been unrevealing from cardiac standpoint of view echocardiogram showed preserved ejection fraction, stress test no showed no evidence of ischemia.  He came to the office today because he did have episode of syncope he apparently got up in the morning went to the kitchen did some work that he come back and he sits in the recliner he was talking to his wife for a while then he check some phone and then She knows his wife is kind of shaking him and waking him up.  According to his wife she was trying to get his attention but he was not responding when she got up and went to him and started shaking when he woke up.  What is the most worrisome out of the center history is the fact that he wet himself when it happened.  After that he got up he went to the kitchen was able to make breakfast.  Since that time I put Zio patch on him.  Monitor did not show any critical bradycardia episodes of nonconducted P wave which resulted in pauses up to 3.1 seconds but those were asymptomatic.  Since the time I seen him he did not have any more chest pain tightness squeezing pressure burning chest dizziness or passing out. He comes today to my office and he had few issues first of all we did notice that changed from hydrochlorothiazide to Lasix make him urinate a lot keep his legs without swelling but blood pressure seems to be elevated  he wanted me to switch him back to hydrochlorothiazide which I do not mind to do another condition that he will get a watch for any signs of congestion/swelling of lower extremities.  Another issue that he brought up today is the fact that he thinks he is dehydrated he describes situation that he wants to go in the garden and he works hard in the garden apparently he put some 8 blocks of 50 pounds of mulch and garden with no major difficulties however at the end of that work he was very dizzy he had to go home and drink some extra water he noticed he started being slow which he is obviously very concerned about.  Another issue that he brought up is to the fact that he does have some stool for guaiac being positive, previously had a lot of polyps he is contemplating colonoscopy which from my point of view should be okay to perform.  Past Medical History:  Diagnosis Date   Anemia of unknown etiology 08/15/2021   Arthritis    oa bo hips   Asbestos exposure 04/01/2020   Atopic dermatitis 01/08/2020   Atypical chest pain 09/27/2017   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/11/2016   Cancer ([place])    squamous cell removed 3 yrs ago from upper chest by neck   Cardiomyopathy ([place]) 01/08/2020   Formatting of this note might be  different from the original. EF 50-55%. Bee Marchiano.  Scar on ST 2019. PVC on monitor.   Chronic rhinitis 11/07/2018   CKD (chronic kidney disease), stage III ([place]) 01/11/2016   Cough last week   no phlegm jill dr Wynelle Link pa aware   Diabetes mellitus with stage 3 chronic kidney disease ([place]) 01/11/2016   Diabetes mellitus without complication ([place])    Dyspnea on exertion 04/01/2020   Elevated prostate specific antigen (PSA) 03/22/2016   Formatting of this note might be different from the original. No longer following.   Essential hypertension 01/11/2016   GERD without esophagitis 05/01/2018   History of iron deficiency 05/28/2018   Formatting of this note might be different from the original. After  surgeries. Follow. No longer anemic.   History of kidney stones    years ago, passed on own   Hyperlipidemia    Hypertension    Mixed hyperlipidemia 01/11/2016   Myalgia due to statin 08/13/2021   OA (osteoarthritis) of hip 01/31/2018   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   Shortness of breath 01/21/2021   Strain of knee 11/25/2019   Syncope and collapse 01/01/2021   Vertigo    jan  2019 surgery rescheduled ent dr Laurance Flatten [place] no vertigo since end of january    Past Surgical History:  Procedure Laterality Date   colonscopy     with polyp removal   HERNIA REPAIR  1954   left inguinal hernia   TOTAL HIP ARTHROPLASTY Left 01/31/2018   Procedure: LEFT TOTAL HIP ARTHROPLASTY ANTERIOR APPROACH;  Surgeon: Gaynelle Arabian, MD;  Location: [facility];  Service: Orthopedics;  Laterality: Left;   TOTAL HIP ARTHROPLASTY Right 05/16/2018   Procedure: RIGHT TOTAL HIP ARTHROPLASTY ANTERIOR APPROACH;  Surgeon: Gaynelle Arabian, MD;  Location: [facility];  Service: Orthopedics;  Laterality: Right;    Current Medications: Current Meds  Medication Sig   aspirin EC 81 MG tablet Take 81 mg by mouth daily.   Cholecalciferol (VITAMIN D3) 25 MCG (1000 UT) CAPS Take 1 capsule by mouth daily.   Coenzyme Q10 (CO Q 10) 100 MG CAPS Take 100 mg by mouth 2 (two) times daily.   dicyclomine (BENTYL) 10 MG capsule Take 10 mg by mouth as needed for cramping.   ezetimibe (ZETIA) 10 MG tablet Take 1 tablet (10 mg total) by mouth daily.   fluticasone (FLONASE) 50 MCG/ACT nasal spray Place 2 sprays into both nostrils daily as needed for allergies or rhinitis.   hydrochlorothiazide (HYDRODIURIL) 25 MG tablet Take 1 tablet (25 mg total) by mouth daily.   lisinopril (PRINIVIL,ZESTRIL) 40 MG tablet Take 40 mg by mouth daily.    metFORMIN (GLUCOPHAGE-XR) 500 MG 24 hr tablet Take 1,000 mg by mouth 2 (two) times daily.    metoprolol succinate (TOPROL-XL) 25 MG 24 hr tablet TAKE 1 TABLET BY MOUTH ONCE  DAILY   vitamin  B-12 (CYANOCOBALAMIN) 1000 MCG tablet Take 1,000 mcg by mouth daily.   [DISCONTINUED] Furosemide (LASIX PO) Take 20 mg by mouth daily.     Allergies:   Ciprofloxacin, Other, Statins, and Sulfamethoxazole   Social History   Socioeconomic History   Marital status: Single    Spouse name: Not on file   Number of children: Not on file   Years of education: Not on file   Highest education level: Not on file  Occupational History   Not on file  Tobacco Use   Smoking status: Never   Smokeless tobacco: Never  Vaping Use  Vaping Use: Never used  Substance and Sexual Activity   Alcohol use: Never   Drug use: Never   Sexual activity: Not on file  Other Topics Concern   Not on file  Social History Narrative   Not on file   Social Determinants of Health   Financial Resource Strain: Not on file  Food Insecurity: Not on file  Transportation Needs: Not on file  Physical Activity: Not on file  Stress: Not on file  Social Connections: Not on file     Family History: [name] family history includes Heart failure in his mother. ROS:   Please see the history of present illness.    All 14 point review of systems negative except as described per history of present illness  EKGs/Labs/Other Studies Reviewed:      Recent Labs: No results found for requested labs within last 365 days.  Recent Lipid Panel No results found for: "CHOL", "TRIG", "HDL", "CHOLHDL", "VLDL", "LDLCALC", "LDLDIRECT"  Physical Exam:    VS:  BP (!) 150/80 (BP Location: Left Arm, Patient Position: Sitting, Cuff Size: Normal)   Pulse 60   Ht 5' 9.75" (1.772 m)   Wt 223 lb (101.2 kg)   SpO2 98%   BMI 32.23 kg/m     Wt Readings from Last 3 Encounters:  02/06/23 223 lb (101.2 kg)  08/08/22 222 lb 9.6 oz (101 kg)  08/08/22 222 lb 9.6 oz (101 kg)     GEN:  Well nourished, well developed in no acute distress HEENT: Normal NECK: No JVD; No carotid bruits LYMPHATICS: No lymphadenopathy CARDIAC: RRR,  no murmurs, no rubs, no gallops RESPIRATORY:  Clear to auscultation without rales, wheezing or rhonchi  ABDOMEN: Soft, non-tender, non-distended MUSCULOSKELETAL:  No edema; No deformity  SKIN: Warm and dry LOWER EXTREMITIES: no swelling NEUROLOGIC:  Alert and oriented x 3 PSYCHIATRIC:  Normal affect   ASSESSMENT:    1. Syncope, unspecified syncope type   2. Dilated cardiomyopathy ([place])   3. Bifascicular bundle branch block   4. Essential hypertension   5. Dyspnea on exertion    PLAN:    In order of problems listed above:  Dizziness.  I will ask him to wear Zio patch to see if he get any significant arrhythmia. Essential hypertension which is uncontrolled.  Will switch him back to hydrochlorothiazide I told him that he Flextra swelling he can take can take as needed Lasix. Bifascicular bundle branch block.  Again monitor will be placed to make sure he does not have any significant arrhythmia. We had a long discussion about multiple issues.  From my point of view it is okay for him to have colonoscopy done   Medication Adjustments/Labs and Tests Ordered: Current medicines are reviewed at length with the patient today.  Concerns regarding medicines are outlined above.  Orders Placed This Encounter  Procedures   LONG TERM MONITOR (3-14 DAYS)   Medication changes:  Meds ordered this encounter  Medications   hydrochlorothiazide (HYDRODIURIL) 25 MG tablet    Sig: Take 1 tablet (25 mg total) by mouth daily.    Dispense:  90 tablet    Refill:  3    Signed, Park Liter, MD, [facility] 02/06/2023 11:52 AM    [place]

## 2023-02-06 NOTE — Patient Instructions (Signed)
Medication Instructions:  Stop Lasix Start HCTZ '25mg'$  1 tablet daily   Lab Work: None Ordered If you have labs (blood work) drawn today and your tests are completely normal, you will receive your results only by: MyChart Message (if you have MyChart) OR A paper copy in the mail If you have any lab test that is abnormal or we need to change your treatment, we will call you to review the results.   Testing/Procedures:  WHY IS MY DOCTOR PRESCRIBING ZIO? The Zio system is proven and trusted by physicians to detect and diagnose irregular heart rhythms -- and has been prescribed to hundreds of thousands of patients.  The FDA has cleared the Zio system to monitor for many different kinds of irregular heart rhythms. In a study, physicians were able to reach a diagnosis 90% of the time with the Zio system1.  You can wear the Zio monitor -- a small, discreet, comfortable patch -- during your normal day-to-day activity, including while you sleep, shower, and exercise, while it records every single heartbeat for analysis.  1Barrett, P., et al. Comparison of 24 Hour Holter Monitoring Versus 14 Day Novel Adhesive Patch Electrocardiographic Monitoring. [place], 2014.  ZIO VS. HOLTER MONITORING The Zio monitor can be comfortably worn for up to 14 days. Holter monitors can be worn for 24 to 48 hours, limiting the time to record any irregular heart rhythms you may have. Zio is able to capture data for the 51% of patients who have their first symptom-triggered arrhythmia after 48 hours.1  LIVE WITHOUT RESTRICTIONS The Zio ambulatory cardiac monitor is a small, unobtrusive, and water-resistant patch--you might even forget you're wearing it. The Zio monitor records and stores every beat of your heart, whether you're sleeping, working out, or showering.     Follow-Up: At [facility], you and your health needs are our priority.  As part of our continuing mission to provide you with  exceptional heart care, we have created designated Provider Care Teams.  These Care Teams include your primary Cardiologist (physician) and Advanced Practice Providers (APPs -  Physician Assistants and Nurse Practitioners) who all work together to provide you with the care you need, when you need it.  We recommend signing up for the patient portal called "MyChart".  Sign up information is provided on this After Visit Summary.  MyChart is used to connect with patients for Virtual Visits (Telemedicine).  Patients are able to view lab/test results, encounter notes, upcoming appointments, etc.  Non-urgent messages can be sent to your provider as well.   To learn more about what you can do with MyChart, go to [email or web address].    Your next appointment:   6 month(s)  The format for your next appointment:   In Person  Provider:   Jenne Campus, MD    Other Instructions NA

## 2023-02-24 ENCOUNTER — Other Ambulatory Visit: Payer: Self-pay

## 2023-02-24 ENCOUNTER — Telehealth: Payer: Self-pay

## 2023-02-24 ENCOUNTER — Telehealth: Payer: Self-pay | Admitting: Cardiology

## 2023-02-24 NOTE — Telephone Encounter (Signed)
Calling with critical zio patch results. Please advise

## 2023-02-24 NOTE — Telephone Encounter (Signed)
Received a call from Eugenio Saenz at [facility] with a zio alert. She reported that the patient had 44 episodes of second degree AVB and 4 episodes of pauses greater than 3 seconds. I printed off the report and showed Dr. Agustin Cree and he recommended stopping the patients metoprolol. Attempted to call the patient and inform him of Dr. Wendy Poet recommendation. He did not answer the phone. Left message for the patient to call back.

## 2023-02-24 NOTE — Telephone Encounter (Signed)
Received a call from [place] at [facility] with a zio alert. She reported that the patient had 44 episodes of second degree AVB and 4 episodes of pauses greater than 3 seconds. I printed off the report and showed Dr. Agustin Cree and he recommended stopping the patients metoprolol. Attempted to call the patient and inform him of Dr. Wendy Poet recommendation. He did not answer the phone. Left message for the patient to call back.

## 2023-02-27 ENCOUNTER — Other Ambulatory Visit: Payer: Self-pay

## 2023-02-27 ENCOUNTER — Telehealth: Payer: Self-pay | Admitting: Cardiology

## 2023-02-27 NOTE — Telephone Encounter (Signed)
Called patient and informed him of the results of his heart monitor and that Dr. Agustin Cree recommended that he stop taking his Metoprolol. Patient was agreeable with that plan and verbalized understanding. Patient had no further questions at this time.

## 2023-02-27 NOTE — Telephone Encounter (Signed)
Pt requested both providers give feedback regarding his monitor results. Pt states that he has stopped the Metoprolol as advised.  Patch Wear Time:  13 days and 21 hours (2024-03-11T11:26:39-0400 to 2024-03-25T08:41:11-398)   Patient had a min HR of 21 bpm, max HR of 154 bpm, and avg HR of 60 bpm. Predominant underlying rhythm was Sinus Rhythm. First Degree AV Block was present. Bundle Branch Block/IVCD was present. QRS morphology changes were present. 894 Supraventricular  Tachycardia runs occurred, the run with the fastest interval lasting 4 beats with a max rate of 154 bpm, the longest lasting 4 mins 33 secs with an avg rate of 92 bpm. 4 Pauses occurred, the longest lasting 3.3 secs (19 bpm). Pauses occurred due to  Second Degree AV Block. 44 episode(s) of AV Block (2nd) occurred, lasting a total of 4 mins 54 secs. Isolated SVEs were occasional (1.7%, 19915), SVE Couplets were rare (<1.0%, 338), and SVE Triplets were rare (<1.0%, 79). Isolated VEs were rare  (<1.0%), VE Couplets were rare (<1.0%), and no VE Triplets were present. MD notification criteria for Second Degree AV Block (3.0-3.3 seconds of Ventricular Asystole) and Bradycardia met - report posted prior to notification per account request (EB).   Summary conclusions: 4 pauses noted longest lasting 3.3 seconds mechanism was second-degree AV block.

## 2023-02-27 NOTE — Telephone Encounter (Signed)
Patient was calling back with some questions concerning the heart monitor. Please advise

## 2023-03-01 NOTE — Telephone Encounter (Signed)
Recommendations reviewed with pt as per Dr. Macky Lower note.  Pt verbalized understanding and had no additional questions.

## 2023-03-08 ENCOUNTER — Telehealth: Payer: Self-pay | Admitting: Cardiology

## 2023-03-08 NOTE — Telephone Encounter (Signed)
Talked with pt about monitor results and he decided he wanted to be seen for a follow up. Sent to front desk to schedule appt

## 2023-03-08 NOTE — Telephone Encounter (Signed)
Pt calling to f/u on monitor results. Please advise

## 2023-04-05 ENCOUNTER — Ambulatory Visit: Payer: Medicare Other | Admitting: Cardiology

## 2023-08-04 ENCOUNTER — Encounter: Payer: Self-pay | Admitting: Cardiology

## 2023-08-04 DIAGNOSIS — H9193 Unspecified hearing loss, bilateral: Secondary | ICD-10-CM

## 2023-08-04 HISTORY — DX: Unspecified hearing loss, bilateral: H91.93

## 2023-08-07 ENCOUNTER — Ambulatory Visit: Payer: Medicare Other | Attending: Cardiology | Admitting: Cardiology

## 2023-08-07 ENCOUNTER — Encounter: Payer: Self-pay | Admitting: Cardiology

## 2023-08-07 VITALS — BP 144/70 | HR 64 | Ht 69.0 in | Wt 221.4 lb

## 2023-08-07 DIAGNOSIS — E6609 Other obesity due to excess calories: Secondary | ICD-10-CM | POA: Insufficient documentation

## 2023-08-07 DIAGNOSIS — I452 Bifascicular block: Secondary | ICD-10-CM | POA: Diagnosis not present

## 2023-08-07 DIAGNOSIS — I420 Dilated cardiomyopathy: Secondary | ICD-10-CM | POA: Insufficient documentation

## 2023-08-07 DIAGNOSIS — I10 Essential (primary) hypertension: Secondary | ICD-10-CM | POA: Insufficient documentation

## 2023-08-07 DIAGNOSIS — E1122 Type 2 diabetes mellitus with diabetic chronic kidney disease: Secondary | ICD-10-CM | POA: Insufficient documentation

## 2023-08-07 DIAGNOSIS — Z6833 Body mass index (BMI) 33.0-33.9, adult: Secondary | ICD-10-CM | POA: Diagnosis present

## 2023-08-07 DIAGNOSIS — N183 Chronic kidney disease, stage 3 unspecified: Secondary | ICD-10-CM | POA: Diagnosis present

## 2023-08-07 DIAGNOSIS — E782 Mixed hyperlipidemia: Secondary | ICD-10-CM | POA: Diagnosis not present

## 2023-08-07 DIAGNOSIS — Z7984 Long term (current) use of oral hypoglycemic drugs: Secondary | ICD-10-CM

## 2023-08-07 NOTE — Progress Notes (Signed)
Cardiology Office Note:    Date:  08/07/2023   ID:  Phillip Neal, DOB 03/12/1931, MRN [id number]  PCP:  Gordan Payment., MD  Cardiologist:  Gypsy Balsam, MD    Referring MD: Gordan Payment., MD   No chief complaint on file. Doing well  History of Present Illness:    Phillip Neal is a 89 y.o. male past medical history significant for trifascicular block, essential hypertension, atypical chest pain stress test being negative he did have some complaining of dyspnea echocardiogram showed preserved ejection fraction last year.  He also got episode of passing out after that Zio patch showed no significant arrhythmia.  He comes today to months for follow-up overall doing well.  He denies have any chest pain tightness squeezing pressure burning chest.  Still working the garden he is 89 years old but able to do working the garden with no major difficulties he said he slowed down a little bit but overall seems to doing quite well no passing out he described the fact when he turned very quickly room can spin around a little bit but no other symptoms  Past Medical History:  Diagnosis Date   Anemia of unknown etiology 08/15/2021   Asbestos exposure 04/01/2020   Atopic dermatitis 01/08/2020   Atypical chest pain 09/27/2017   Benign prostatic hyperplasia 03/22/2016   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/11/2016   Bilateral hearing loss 08/04/2023   Cancer (HCC)    squamous cell removed 3 yrs ago from upper chest by neck   Cardiomyopathy (HCC) 01/08/2020   Formatting of this note might be different from the original. EF 50-55%. Phillip Neal.  Scar on ST 2019. PVC on monitor.   Chronic rhinitis 11/07/2018   Chronic venous insufficiency 07/28/2022   CKD (chronic kidney disease), stage III (HCC) 01/11/2016   Class 1 obesity due to excess calories with serious comorbidity and body mass index (BMI) of 33.0 to 33.9 in adult 03/22/2016   Cough last week   no phlegm jill dr Lequita Halt pa aware    Diabetes mellitus with stage 3 chronic kidney disease (HCC) 01/11/2016   Diabetes mellitus without complication (HCC)    Dyspnea on exertion 04/01/2020   Elevated prostate specific antigen (PSA) 03/22/2016   Formatting of this note might be different from the original. No longer following.   Essential hypertension 01/11/2016   GERD without esophagitis 05/01/2018   Gouty arthropathy 03/22/2016   Hematest positive stools 01/18/2023   History of iron deficiency 05/28/2018   Formatting of this note might be different from the original. After surgeries. Follow. No longer anemic.   History of kidney stones    years ago, passed on own   Hyperlipidemia    Hypertension    Mixed hyperlipidemia 01/11/2016   Myalgia due to statin 08/13/2021   OA (osteoarthritis) of hip 01/31/2018   Onychomycosis 02/14/2022   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   Sensorineural hearing loss, bilateral 02/03/2023   Shortness of breath 01/21/2021   Strain of knee 11/25/2019   Syncope and collapse 01/01/2021   Vertigo    jan  2019 surgery rescheduled ent dr Christell Constant [place] no vertigo since end of january   Vitamin B12 deficiency 01/11/2016    Past Surgical History:  Procedure Laterality Date   colonscopy     with polyp removal   HERNIA REPAIR  1954   left inguinal hernia   TOTAL HIP ARTHROPLASTY Left 01/31/2018   Procedure: LEFT TOTAL HIP ARTHROPLASTY ANTERIOR APPROACH;  Surgeon: Lequita Halt,  Homero Fellers, MD;  Location: [facility];  Service: Orthopedics;  Laterality: Left;   TOTAL HIP ARTHROPLASTY Right 05/16/2018   Procedure: RIGHT TOTAL HIP ARTHROPLASTY ANTERIOR APPROACH;  Surgeon: Ollen Gross, MD;  Location: [facility];  Service: Orthopedics;  Laterality: Right;    Current Medications: Current Meds  Medication Sig   aspirin EC 81 MG tablet Take 81 mg by mouth daily.   Cholecalciferol (VITAMIN D3) 25 MCG (1000 UT) CAPS Take 1 capsule by mouth daily.   Coenzyme Q10 (CO Q 10) 100 MG CAPS Take 100  mg by mouth 2 (two) times daily.   dicyclomine (BENTYL) 10 MG capsule Take 10 mg by mouth as needed for cramping.   ezetimibe (ZETIA) 10 MG tablet Take 10 mg by mouth daily.   fluticasone (FLONASE) 50 MCG/ACT nasal spray Place 2 sprays into both nostrils daily as needed for allergies or rhinitis.   hydrochlorothiazide (HYDRODIURIL) 25 MG tablet Take 1 tablet (25 mg total) by mouth daily.   lisinopril (PRINIVIL,ZESTRIL) 40 MG tablet Take 40 mg by mouth daily.    metFORMIN (GLUCOPHAGE-XR) 500 MG 24 hr tablet Take 1,000 mg by mouth 2 (two) times daily.    vitamin B-12 (CYANOCOBALAMIN) 1000 MCG tablet Take 1,000 mcg by mouth daily.     Allergies:   Sulfa antibiotics, Ciprofloxacin, Other, Statins, and Sulfamethoxazole   Social History   Socioeconomic History   Marital status: Single    Spouse name: Not on file   Number of children: Not on file   Years of education: Not on file   Highest education level: Not on file  Occupational History   Not on file  Tobacco Use   Smoking status: Never   Smokeless tobacco: Never  Vaping Use   Vaping status: Never Used  Substance and Sexual Activity   Alcohol use: Never   Drug use: Never   Sexual activity: Not on file  Other Topics Concern   Not on file  Social History Narrative   Not on file   Social Determinants of Health   Financial Resource Strain: Not on file  Food Insecurity: Low Risk  (06/26/2023)   Received from [facility]   Hunger Vital Sign    Worried About Running Out of Food in the Last Year: Never true    Ran Out of Food in the Last Year: Never true  Transportation Needs: Not on file (06/26/2023)  Physical Activity: Not on file  Stress: Not on file  Social Connections: Not on file     Family History: [name] family history includes Heart failure in his mother. ROS:   Please see the history of present illness.    All 14 point review of systems negative except as described per history of present  illness  EKGs/Labs/Other Studies Reviewed:    EKG Interpretation Date/Time:  Monday August 07 2023 09:54:00 EDT Ventricular Rate:  64 PR Interval:  238 QRS Duration:  146 QT Interval:  426 QTC Calculation: 439 R Axis:   -43  Text Interpretation: Sinus rhythm with 1st degree A-V block Left axis deviation Right bundle branch block Left ventricular hypertrophy with repolarization abnormality Cannot rule out Septal infarct , age undetermined Abnormal ECG No previous ECGs available Confirmed by Gypsy Balsam [phone]) on 08/07/2023 10:09:48 AM    Recent Labs: No results found for requested labs within last 365 days.  Recent Lipid Panel No results found for: "CHOL", "TRIG", "HDL", "CHOLHDL", "VLDL", "LDLCALC", "LDLDIRECT"  Physical Exam:    VS:  BP Marland Kitchen)  144/70   Pulse 64   Ht 5\' 9"  (1.753 m)   Wt 221 lb 6.4 oz (100.4 kg)   SpO2 96%   BMI 32.70 kg/m     Wt Readings from Last 3 Encounters:  08/07/23 221 lb 6.4 oz (100.4 kg)  02/06/23 223 lb (101.2 kg)  08/08/22 222 lb 9.6 oz (101 kg)     GEN:  Well nourished, well developed in no acute distress HEENT: Normal NECK: No JVD; No carotid bruits LYMPHATICS: No lymphadenopathy CARDIAC: RRR, no murmurs, no rubs, no gallops RESPIRATORY:  Clear to auscultation without rales, wheezing or rhonchi  ABDOMEN: Soft, non-tender, non-distended MUSCULOSKELETAL:  No edema; No deformity  SKIN: Warm and dry LOWER EXTREMITIES: no swelling NEUROLOGIC:  Alert and oriented x 3 PSYCHIATRIC:  Normal affect   ASSESSMENT:    1. Mixed hyperlipidemia   2. Bifascicular bundle branch block   3. Dilated cardiomyopathy (HCC)   4. Essential hypertension   5. Diabetes mellitus with stage 3 chronic kidney disease (HCC)   6. Class 1 obesity due to excess calories with serious comorbidity and body mass index (BMI) of 33.0 to 33.9 in adult    PLAN:    In order of problems listed above:  Trifascicular block with episode of syncope a while ago.  Monitor  did not show any significant arrhythmia.  He seems to be doing well.  I told him if there will develop dizziness or passing out he needs to let me know immediately. History of cardiomyopathy I will ask him to have another echocardiogram done we will check left ventricle ejection fraction. Dyslipidemia he is on Zetia followed by internal medicine team. Essential hypertension only slightly elevated in the office he said that at home always.   Medication Adjustments/Labs and Tests Ordered: Current medicines are reviewed at length with the patient today.  Concerns regarding medicines are outlined above.  Orders Placed This Encounter  Procedures   EKG 12-Lead   Medication changes: No orders of the defined types were placed in this encounter.   Signed, Georgeanna Lea, MD, [facility] 08/07/2023 10:31 AM    [facility]

## 2023-08-07 NOTE — Patient Instructions (Signed)
Medication Instructions:  Your physician recommends that you continue on your current medications as directed. Please refer to the Current Medication list given to you today.  *If you need a refill on your cardiac medications before your next appointment, please call your pharmacy*   Lab Work: None If you have labs (blood work) drawn today and your tests are completely normal, you will receive your results only by: [place] (if you have MyChart) OR A paper copy in the mail If you have any lab test that is abnormal or we need to change your treatment, we will call you to review the results.   Testing/Procedures: Your physician has requested that you have an echocardiogram. Echocardiography is a painless test that uses sound waves to create images of your heart. It provides your doctor with information about the size and shape of your heart and how well your heart's chambers and valves are working. This procedure takes approximately one hour. There are no restrictions for this procedure. Please do NOT wear cologne, perfume, aftershave, or lotions (deodorant is allowed). Please arrive 15 minutes prior to your appointment time.    Follow-Up: At [facility], you and your health needs are our priority.  As part of our continuing mission to provide you with exceptional heart care, we have created designated Provider Care Teams.  These Care Teams include your primary Cardiologist (physician) and Advanced Practice Providers (APPs -  Physician Assistants and Nurse Practitioners) who all work together to provide you with the care you need, when you need it.  We recommend signing up for the patient portal called "MyChart".  Sign up information is provided on this After Visit Summary.  MyChart is used to connect with patients for Virtual Visits (Telemedicine).  Patients are able to view lab/test results, encounter notes, upcoming appointments, etc.  Non-urgent messages can be sent to your  provider as well.   To learn more about what you can do with MyChart, go to [email or web address].    Your next appointment:   6 month(s)  Provider:   Jenne Campus, MD    Other Instructions None

## 2023-08-07 NOTE — Addendum Note (Signed)
Addended by: Roxanne Mins I on: 08/07/2023 10:37 AM   Modules accepted: Orders

## 2023-08-18 ENCOUNTER — Emergency Department (HOSPITAL_BASED_OUTPATIENT_CLINIC_OR_DEPARTMENT_OTHER)
Admission: EM | Admit: 2023-08-18 | Discharge: 2023-08-18 | Disposition: A | Discharge status: Home / Self Care | Payer: Medicare Other | Attending: Emergency Medicine | Admitting: Emergency Medicine

## 2023-08-18 ENCOUNTER — Emergency Department (HOSPITAL_BASED_OUTPATIENT_CLINIC_OR_DEPARTMENT_OTHER): Payer: Medicare Other

## 2023-08-18 ENCOUNTER — Encounter (HOSPITAL_BASED_OUTPATIENT_CLINIC_OR_DEPARTMENT_OTHER): Payer: Self-pay

## 2023-08-18 DIAGNOSIS — I129 Hypertensive chronic kidney disease with stage 1 through stage 4 chronic kidney disease, or unspecified chronic kidney disease: Secondary | ICD-10-CM | POA: Diagnosis not present

## 2023-08-18 DIAGNOSIS — M79672 Pain in left foot: Secondary | ICD-10-CM | POA: Diagnosis present

## 2023-08-18 DIAGNOSIS — E1122 Type 2 diabetes mellitus with diabetic chronic kidney disease: Secondary | ICD-10-CM | POA: Diagnosis not present

## 2023-08-18 DIAGNOSIS — N183 Chronic kidney disease, stage 3 unspecified: Secondary | ICD-10-CM | POA: Insufficient documentation

## 2023-08-18 DIAGNOSIS — I8002 Phlebitis and thrombophlebitis of superficial vessels of left lower extremity: Secondary | ICD-10-CM | POA: Insufficient documentation

## 2023-08-18 DIAGNOSIS — Z7982 Long term (current) use of aspirin: Secondary | ICD-10-CM | POA: Insufficient documentation

## 2023-08-18 DIAGNOSIS — Z7984 Long term (current) use of oral hypoglycemic drugs: Secondary | ICD-10-CM | POA: Insufficient documentation

## 2023-08-18 DIAGNOSIS — Z79899 Other long term (current) drug therapy: Secondary | ICD-10-CM | POA: Diagnosis not present

## 2023-08-18 NOTE — ED Triage Notes (Addendum)
Pt ambulatory to triage. Reports pain in top of left foot x 3-4 days. Walking to bathroom pain increase and noticed veins sticking out on top of foot and concerned for blood clot. Pain intermittent

## 2023-08-18 NOTE — ED Notes (Signed)
Discharge paperwork reviewed entirely with patient, including follow up care. Pain was under control. No prescriptions were called in, but all questions were addressed.  Pt verbalized understanding as well as all parties involved. No questions or concerns voiced at the time of discharge. No acute distress noted.   Pt ambulated out to PVA without incident or assistance.  

## 2023-08-18 NOTE — Discharge Instructions (Signed)
Thank you for coming to [facility] Emergency Department. You were seen for pain to the top of your left foot with a prominent vein. We did an exam, and this showed likely superficial thrombophlebitis. You can treat this with tylenol 1g every 8 hours, continue your baby aspirin daily, and treat with a heat pack. This should resorb over the next couple of weeks.  Please follow up with your primary care provider within 1 week.   Do not hesitate to return to the ED or call 911 if you experience: -Worsening symptoms -Swelling, pain, or redness in the left calf or leg -Numbness/tingling in the left foot -Lightheadedness, passing out -Fevers/chills -Anything else that concerns you

## 2023-08-18 NOTE — ED Provider Notes (Signed)
[facility] Provider Note   CSN: [id number] Arrival date & time: 08/18/23  7829     History  Chief Complaint  Patient presents with   Foot Pain    Phillip Neal is a 89 y.o. male with PMH as listed below who presents with pain to the top of his left foot that started a few days ago.  He has no history of this.  He has had no swelling, redness, or pain to his left ankle or calf.  He has had no trauma to the foot, does not stubbed his foot or dropped anything on it.  He has no pain with weightbearing, just intermittent sharp pains located to a very small area on the top of the left foot.  No fevers or chills.  Otherwise in his normal state of health.  Takes a baby aspirin, does not take any blood thinners.  No history of DVT or PE.    Past Medical History:  Diagnosis Date   Anemia of unknown etiology 08/15/2021   Asbestos exposure 04/01/2020   Atopic dermatitis 01/08/2020   Atypical chest pain 09/27/2017   Benign prostatic hyperplasia 03/22/2016   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/11/2016   Bilateral hearing loss 08/04/2023   Cancer (HCC)    squamous cell removed 3 yrs ago from upper chest by neck   Cardiomyopathy (HCC) 01/08/2020   Formatting of this note might be different from the original. EF 50-55%. Krasowski.  Scar on ST 2019. PVC on monitor.   Chronic rhinitis 11/07/2018   Chronic venous insufficiency 07/28/2022   CKD (chronic kidney disease), stage III (HCC) 01/11/2016   Class 1 obesity due to excess calories with serious comorbidity and body mass index (BMI) of 33.0 to 33.9 in adult 03/22/2016   Cough last week   no phlegm jill dr Lequita Halt pa aware   Diabetes mellitus with stage 3 chronic kidney disease (HCC) 01/11/2016   Diabetes mellitus without complication (HCC)    Dyspnea on exertion 04/01/2020   Elevated prostate specific antigen (PSA) 03/22/2016   Formatting of this note might be different from the original. No  longer following.   Essential hypertension 01/11/2016   GERD without esophagitis 05/01/2018   Gouty arthropathy 03/22/2016   Hematest positive stools 01/18/2023   History of iron deficiency 05/28/2018   Formatting of this note might be different from the original. After surgeries. Follow. No longer anemic.   History of kidney stones    years ago, passed on own   Hyperlipidemia    Hypertension    Mixed hyperlipidemia 01/11/2016   Myalgia due to statin 08/13/2021   OA (osteoarthritis) of hip 01/31/2018   Onychomycosis 02/14/2022   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   Sensorineural hearing loss, bilateral 02/03/2023   Shortness of breath 01/21/2021   Strain of knee 11/25/2019   Syncope and collapse 01/01/2021   Vertigo    jan  2019 surgery rescheduled ent dr Christell Constant [place] no vertigo since end of january   Vitamin B12 deficiency 01/11/2016       Home Medications Prior to Admission medications   Medication Sig Start Date End Date Taking? Authorizing Provider  aspirin EC 81 MG tablet Take 81 mg by mouth daily.    [provider]  Cholecalciferol (VITAMIN D3) 25 MCG (1000 UT) CAPS Take 1 capsule by mouth daily.    [provider]  Coenzyme Q10 (CO Q 10) 100 MG CAPS Take 100 mg by mouth  2 (two) times daily.    [provider]  dicyclomine (BENTYL) 10 MG capsule Take 10 mg by mouth as needed for cramping. 04/30/21   [provider]  ezetimibe (ZETIA) 10 MG tablet Take 10 mg by mouth daily.    [provider]  fluticasone (FLONASE) 50 MCG/ACT nasal spray Place 2 sprays into both nostrils daily as needed for allergies or rhinitis.    [provider]  hydrochlorothiazide (HYDRODIURIL) 25 MG tablet Take 1 tablet (25 mg total) by mouth daily. 02/06/23   Georgeanna Lea, MD  lisinopril (PRINIVIL,ZESTRIL) 40 MG tablet Take 40 mg by mouth daily.  09/19/17   [provider]  metFORMIN (GLUCOPHAGE-XR) 500  MG 24 hr tablet Take 1,000 mg by mouth 2 (two) times daily.  09/19/17   [provider]  vitamin B-12 (CYANOCOBALAMIN) 1000 MCG tablet Take 1,000 mcg by mouth daily.    [provider]      Allergies    Sulfa antibiotics, Ciprofloxacin, Other, Statins, and Sulfamethoxazole    Review of Systems   Review of Systems A 10 point review of systems was performed and is negative unless otherwise reported in HPI.  Physical Exam Updated Vital Signs BP (!) 151/75 (BP Location: Left Arm)   Pulse 66   Temp 97.8 F (36.6 C) (Oral)   Resp 17   Wt 101.2 kg   SpO2 98%   BMI 32.93 kg/m  Physical Exam General: Normal appearing male, lying in bed.  HEENT: Sclera anicteric, MMM, trachea midline.  Cardiology: RRR Resp: Normal respiratory rate and effort.  Abd: Soft, non-tender, non-distended. No rebound tenderness or guarding.  GU: Deferred. MSK: No peripheral edema or signs of trauma.  Very small localized area of tenderness to palpation on the dorsal left foot with prominent superficial vein noted that is hard to palpation and a small, 0.5 cm length.  Intact DP/PT pulses bilaterally.  No peripheral edema, no erythema.  No tender palpation of the ankle or calf.  Full range of motion of the left lower extremity without pain.  Intact sensation in bilateral lower extremities. Skin: warm, dry.  Neuro: A&Ox4, CNs II-XII grossly intact. MAEs. Sensation grossly intact.  Psych: Normal mood and affect.   ED Results / Procedures / Treatments   Labs (all labs ordered are listed, but only abnormal results are displayed) Labs Reviewed - No data to display  EKG None  Radiology No results found.  Procedures Procedures    Medications Ordered in ED Medications - No data to display  ED Course/ Medical Decision Making/ A&P                          Medical Decision Making   MDM:    Patient with exam consistent with a very small superficial thrombophlebitis in the left dorsal  foot.  He has no findings that would indicate a lower extremity DVT.  He is neurovascular intact with good pulses, no numbness tingling indicate any arterial ischemia.  No open wounds, no induration fluctuance or significant erythema that would indicate an area of cellulitis.  No trauma to the foot to indicate fracture or dislocation and no deformity, no pain with weightbearing.  Patient is instructed to continue his baby aspirin daily as he originally would have, apply a heat pack, and take Tylenol for discomfort.  He is given discharge instructions and return precautions, advised to follow-up with his PCP within 1 to 2 weeks.  All  questions answered to he and his daughters satisfaction.    Additional history obtained from chart review, daughters at bedside.   Social Determinants of Health:  lives independently  Disposition:  DC w/ discharge instructions/return precautions. All questions answered to patient's satisfaction.    Co morbidities that complicate the patient evaluation  Past Medical History:  Diagnosis Date   Anemia of unknown etiology 08/15/2021   Asbestos exposure 04/01/2020   Atopic dermatitis 01/08/2020   Atypical chest pain 09/27/2017   Benign prostatic hyperplasia 03/22/2016   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/11/2016   Bilateral hearing loss 08/04/2023   Cancer (HCC)    squamous cell removed 3 yrs ago from upper chest by neck   Cardiomyopathy (HCC) 01/08/2020   Formatting of this note might be different from the original. EF 50-55%. Krasowski.  Scar on ST 2019. PVC on monitor.   Chronic rhinitis 11/07/2018   Chronic venous insufficiency 07/28/2022   CKD (chronic kidney disease), stage III (HCC) 01/11/2016   Class 1 obesity due to excess calories with serious comorbidity and body mass index (BMI) of 33.0 to 33.9 in adult 03/22/2016   Cough last week   no phlegm jill dr Lequita Halt pa aware   Diabetes mellitus with stage 3 chronic kidney disease (HCC) 01/11/2016    Diabetes mellitus without complication (HCC)    Dyspnea on exertion 04/01/2020   Elevated prostate specific antigen (PSA) 03/22/2016   Formatting of this note might be different from the original. No longer following.   Essential hypertension 01/11/2016   GERD without esophagitis 05/01/2018   Gouty arthropathy 03/22/2016   Hematest positive stools 01/18/2023   History of iron deficiency 05/28/2018   Formatting of this note might be different from the original. After surgeries. Follow. No longer anemic.   History of kidney stones    years ago, passed on own   Hyperlipidemia    Hypertension    Mixed hyperlipidemia 01/11/2016   Myalgia due to statin 08/13/2021   OA (osteoarthritis) of hip 01/31/2018   Onychomycosis 02/14/2022   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   Sensorineural hearing loss, bilateral 02/03/2023   Shortness of breath 01/21/2021   Strain of knee 11/25/2019   Syncope and collapse 01/01/2021   Vertigo    jan  2019 surgery rescheduled ent dr Christell Constant [place] no vertigo since end of january   Vitamin B12 deficiency 01/11/2016     Medicines No orders of the defined types were placed in this encounter.   I have reviewed the patients home medicines and have made adjustments as needed  Problem List / ED Course: Problem List Items Addressed This Visit   None Visit Diagnoses     Thrombophlebitis of superficial veins of left lower extremity    -  Primary                   This note was created using dictation software, which may contain spelling or grammatical errors.    Loetta Rough, MD 08/18/23 1050

## 2023-09-07 ENCOUNTER — Other Ambulatory Visit: Payer: Medicare Other

## 2023-09-19 ENCOUNTER — Ambulatory Visit: Payer: Medicare Other | Attending: Cardiology

## 2023-09-19 DIAGNOSIS — N183 Chronic kidney disease, stage 3 unspecified: Secondary | ICD-10-CM | POA: Diagnosis present

## 2023-09-19 DIAGNOSIS — I420 Dilated cardiomyopathy: Secondary | ICD-10-CM

## 2023-09-19 DIAGNOSIS — E66811 Obesity, class 1: Secondary | ICD-10-CM | POA: Insufficient documentation

## 2023-09-19 DIAGNOSIS — I10 Essential (primary) hypertension: Secondary | ICD-10-CM | POA: Diagnosis present

## 2023-09-19 DIAGNOSIS — I452 Bifascicular block: Secondary | ICD-10-CM | POA: Insufficient documentation

## 2023-09-19 DIAGNOSIS — E1122 Type 2 diabetes mellitus with diabetic chronic kidney disease: Secondary | ICD-10-CM | POA: Diagnosis present

## 2023-09-19 DIAGNOSIS — E6609 Other obesity due to excess calories: Secondary | ICD-10-CM | POA: Diagnosis present

## 2023-09-19 DIAGNOSIS — E782 Mixed hyperlipidemia: Secondary | ICD-10-CM | POA: Diagnosis present

## 2023-09-19 DIAGNOSIS — Z6833 Body mass index (BMI) 33.0-33.9, adult: Secondary | ICD-10-CM | POA: Diagnosis present

## 2023-09-19 LAB — ECHOCARDIOGRAM COMPLETE
Area-P 1/2: 2.9 cm2
P 1/2 time: 580 ms
S' Lateral: 2.5 cm

## 2023-09-26 ENCOUNTER — Telehealth: Payer: Self-pay

## 2023-09-26 NOTE — Telephone Encounter (Signed)
-----   Message from Gypsy Balsam sent at 09/22/2023  1:38 PM EDT ----- Echocardiogram showed preserved left ventricular ejection fraction,

## 2023-09-26 NOTE — Telephone Encounter (Signed)
Patient notified of results and verbalized understanding.  

## 2023-10-11 ENCOUNTER — Encounter (HOSPITAL_BASED_OUTPATIENT_CLINIC_OR_DEPARTMENT_OTHER): Payer: Self-pay

## 2023-10-11 ENCOUNTER — Ambulatory Visit (HOSPITAL_BASED_OUTPATIENT_CLINIC_OR_DEPARTMENT_OTHER)
Admission: EM | Admit: 2023-10-11 | Discharge: 2023-10-11 | Disposition: A | Discharge status: Home / Self Care | Payer: Medicare Other

## 2023-10-11 DIAGNOSIS — M79672 Pain in left foot: Secondary | ICD-10-CM

## 2023-10-11 DIAGNOSIS — I872 Venous insufficiency (chronic) (peripheral): Secondary | ICD-10-CM

## 2023-10-11 DIAGNOSIS — I8002 Phlebitis and thrombophlebitis of superficial vessels of left lower extremity: Secondary | ICD-10-CM | POA: Diagnosis not present

## 2023-10-11 NOTE — ED Triage Notes (Signed)
Pt c/o lt foot pain with swelling to veins on top of foot. State went to ED and tx'd for peripheral vein swelling a few weeks ago. States woke up today the swelling is worse.

## 2023-10-11 NOTE — ED Provider Notes (Signed)
Phillip [facility]    CSN: [id number] Arrival date & time: 10/11/23  1216      History   Chief Complaint Chief Complaint  Patient presents with   Foot Pain    HPI Phillip Neal is a 89 y.o. male.   Phillip Neal is a 89 y.o. male presenting for chief complaint of pain to the left foot that initially started 1 month ago and worsened overnight last night. Pain began to the dorsal aspect of the left midfoot and was described as "twinges" to the left foot.  He also noticed some increased swelling to the veins of the left foot causing concern for "blockage".  He went to [facility] emergency department where he was found to have thrombophlebitis of the left foot and advised to use warm compresses, rest, elevation, and Tylenol as needed for symptoms.  He has been using these interventions with some relief until this morning when pain started to the medial left midfoot.  Pain is currently a 3 on a scale of 0-10 and does not change with weightbearing activity/movement.  No recent trauma or injuries to the left foot.  No numbness or tingling distally to pain, rash, redness, warmth, or bruising.  No new orthopnea or leg swelling reported.  He sleeps in his recliner nightly with his legs elevated.  History of chronic venous insufficiency.  States his symptoms worsened after he stopped using compression stockings.  He did not put his compression stockings back on because he states the ER provider did not tell him to.    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 EF 50-55%. Krasowski.  Scar on ST 2019. PVC on monitor.   Chronic rhinitis 11/07/2018   Chronic venous insufficiency 07/28/2022   CKD (chronic kidney disease), stage III (HCC) 01/11/2016   Class 1 obesity due to excess calories with serious comorbidity and body mass index (BMI) of 33.0 to 33.9 in adult 03/22/2016   Cough last week   no phlegm jill dr Lequita Halt pa aware   Diabetes mellitus with stage 3 chronic kidney disease (HCC) 01/11/2016   Diabetes mellitus without complication (HCC)    Dyspnea on exertion 04/01/2020   Elevated prostate specific antigen (PSA) 03/22/2016   Formatting of this note might be different from the original. No longer following.   Essential hypertension 01/11/2016   GERD without esophagitis 05/01/2018   Gouty arthropathy 03/22/2016   Hematest positive stools 01/18/2023   History of iron deficiency 05/28/2018   Formatting of this note might be different from the original. After surgeries. Follow. No longer anemic.   History of kidney stones    years ago, passed on own   Hyperlipidemia    Hypertension    Mixed hyperlipidemia 01/11/2016   Myalgia due to statin 08/13/2021   OA (osteoarthritis) of hip 01/31/2018   Onychomycosis 02/14/2022   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   Sensorineural hearing loss, bilateral 02/03/2023   Shortness of breath 01/21/2021   Strain of knee 11/25/2019   Syncope  and collapse 01/01/2021   Vertigo    jan  2019 surgery rescheduled ent dr Christell Constant [place] no vertigo since end of january   Vitamin B12 deficiency 01/11/2016    Patient Active Problem List   Diagnosis Date Noted   Bilateral hearing loss 08/04/2023   Sensorineural hearing loss, bilateral 02/03/2023   Hematest positive stools 01/18/2023   Chronic venous insufficiency 07/28/2022   Onychomycosis 02/14/2022   Anemia of unknown etiology 08/15/2021   Myalgia due to statin 08/13/2021   Shortness of breath  01/21/2021   Syncope and collapse 01/01/2021   Vertigo    Hypertension    Hyperlipidemia    History of kidney stones    Diabetes mellitus without complication (HCC)    Cough    Cancer (HCC)    Dyspnea on exertion 04/01/2020   Asbestos exposure 04/01/2020   Atopic dermatitis 01/08/2020   Cardiomyopathy (HCC) 01/08/2020   Strain of knee 11/25/2019   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   Chronic rhinitis 11/07/2018   History of iron deficiency 05/28/2018   GERD without esophagitis 05/01/2018   OA (osteoarthritis) of hip 01/31/2018   Atypical chest pain 09/27/2017   Elevated prostate specific antigen (PSA) 03/22/2016   Benign prostatic hyperplasia 03/22/2016   Class 1 obesity due to excess calories with serious comorbidity and body mass index (BMI) of 33.0 to 33.9 in adult 03/22/2016   Gouty arthropathy 03/22/2016   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/11/2016   CKD (chronic kidney disease), stage III (HCC) 01/11/2016   Diabetes mellitus with stage 3 chronic kidney disease (HCC) 01/11/2016   Essential hypertension 01/11/2016   Mixed hyperlipidemia 01/11/2016   Vitamin B12 deficiency 01/11/2016    Past Surgical History:  Procedure Laterality Date   colonscopy     with polyp removal   HERNIA REPAIR  1954   left inguinal hernia   TOTAL HIP ARTHROPLASTY Left 01/31/2018   Procedure: LEFT TOTAL HIP ARTHROPLASTY ANTERIOR APPROACH;  Surgeon: Ollen Gross, MD;  Location: [facility];  Service: Orthopedics;  Laterality: Left;   TOTAL HIP ARTHROPLASTY Right 05/16/2018   Procedure: RIGHT TOTAL HIP ARTHROPLASTY ANTERIOR APPROACH;  Surgeon: Ollen Gross, MD;  Location: [facility];  Service: Orthopedics;  Laterality: Right;       Home Medications    Prior to Admission medications   Medication Sig Start Date End Date Taking? Authorizing Provider  aspirin EC 81 MG tablet Take 81 mg by mouth daily.    [provider]  Cholecalciferol (VITAMIN D3) 25 MCG (1000 UT)  CAPS Take 1 capsule by mouth daily.    [provider]  Coenzyme Q10 (CO Q 10) 100 MG CAPS Take 100 mg by mouth 2 (two) times daily.    [provider]  dicyclomine (BENTYL) 10 MG capsule Take 10 mg by mouth as needed for cramping. 04/30/21   [provider]  ezetimibe (ZETIA) 10 MG tablet Take 10 mg by mouth daily.    [provider]  fluticasone (FLONASE) 50 MCG/ACT nasal spray Place 2 sprays into both nostrils daily as needed for allergies or rhinitis.    [provider]  hydrochlorothiazide (HYDRODIURIL) 25 MG tablet Take 1 tablet (25 mg total) by mouth daily. 02/06/23   Georgeanna Lea, MD  lisinopril (PRINIVIL,ZESTRIL) 40 MG tablet Take 40 mg by mouth daily.  09/19/17   [provider]  metFORMIN (GLUCOPHAGE-XR) 500 MG 24 hr tablet Take 1,000 mg by mouth 2 (two) times daily.  09/19/17   [provider]  vitamin B-12 (CYANOCOBALAMIN) 1000 MCG tablet Take 1,000 mcg by mouth daily.    [provider]    Family History Family History  Problem Relation Age of Onset   Heart failure Mother     Social History Social History   Tobacco Use   Smoking status: Never   Smokeless tobacco: Never  Vaping Use   Vaping status: Never Used  Substance Use Topics   Alcohol use: Never   Drug use: Never     Allergies   Sulfa antibiotics, Ciprofloxacin, Other, Statins, and Sulfamethoxazole   Review of Systems Review of Systems Per HPI  Physical Exam Triage Vital Signs ED Triage Vitals  Encounter Vitals Group     BP 10/11/23 1232 131/70     Systolic BP Percentile --      Diastolic BP Percentile --      Pulse Rate 10/11/23 1232 81     Resp 10/11/23 1232 20     Temp 10/11/23 1232 98.3 F (36.8 C)     Temp Source 10/11/23 1232 Oral     SpO2 10/11/23 1232 96 %     Weight --      Height --      Head Circumference --      Peak Flow --      Pain Score 10/11/23 1233 3     Pain Loc --      Pain Education --       Exclude from Growth Chart --    No data found.  Updated Vital Signs BP 131/70 (BP Location: Right Arm)   Pulse 81   Temp 98.3 F (36.8 C) (Oral)   Resp 20   SpO2 96%   Visual Acuity Right Eye Distance:   Left Eye Distance:   Bilateral Distance:    Right Eye Near:   Left Eye Near:    Bilateral Near:     Physical Exam Vitals and nursing note reviewed.  Constitutional:      Appearance: He is not ill-appearing or toxic-appearing.  HENT:     Head: Normocephalic and atraumatic.     Right Ear: Hearing and external ear normal.     Left Ear: Hearing and external ear normal.     Nose: Nose normal.     Mouth/Throat:     Lips: Pink.  Eyes:     General: Lids are normal. Vision grossly intact. Gaze aligned appropriately.     Extraocular Movements: Extraocular movements intact.     Conjunctiva/sclera: Conjunctivae normal.  Cardiovascular:     Rate and Rhythm: Normal rate and regular rhythm.     Pulses:          Dorsalis pedis pulses are 1+ on the right side and 1+ on the left side.       Posterior tibial pulses are 1+ on the right side and 1+ on the left side.     Heart sounds: Normal heart sounds, S1 normal and S2 normal.  Pulmonary:     Effort: Pulmonary effort is normal. No respiratory distress.     Breath sounds: Normal breath sounds and air entry.  Musculoskeletal:     Cervical back: Neck supple.     Right lower leg: Edema (trace pitting) present.     Left lower leg: Edema (trace pitting) present.  Skin:    General: Skin is warm and dry.     Capillary Refill: Capillary refill takes less than 2 seconds.     Findings: No rash.  Comments: Prominent veins of left foot present without signs of erythema, warmth, or swelling. Sensation and strength intact distally to bilateral lower extremities.   Neurological:     General: No focal deficit present.     Mental Status: He is alert and oriented to person, place, and time. Mental status is at baseline.     Cranial Nerves: No  dysarthria or facial asymmetry.  Psychiatric:        Mood and Affect: Mood normal.        Speech: Speech normal.        Behavior: Behavior normal.        Thought Content: Thought content normal.        Judgment: Judgment normal.      UC Treatments / Results  Labs (all labs ordered are listed, but only abnormal results are displayed) Labs Reviewed - No data to display  EKG   Radiology No results found.  Procedures Procedures (including critical care time)  Medications Ordered in UC Medications - No data to display  Initial Impression / Assessment and Plan / UC Course  I have reviewed the triage vital signs and the nursing notes.  Pertinent labs & imaging results that were available during my care of the patient were reviewed by me and considered in my medical decision making (see chart for details).   1. Chronic venous insufficiency, left foot pain, superficial thrombophlebitis Superficial thrombophlebitis to the left dorsal foot similar to findings in the emergency department at visit on September 20th, 2024. No signs of cellulitis/DVT/arterial ischemia. Warm compresses advised with elevation of the bilateral lower extremities. Encouraged use of compression stockings as this helped with symptoms previously. PCP follow-up in 1-2 weeks if symptoms persist or fail to improve. Tylenol as needed.  Counseled patient on potential for adverse effects with medications prescribed/recommended today, strict ER and return-to-clinic precautions discussed, patient verbalized understanding.    Patient left prior to receiving AVS.  Final Clinical Impressions(s) / UC Diagnoses   Final diagnoses:  Chronic venous insufficiency  Left foot pain   Discharge Instructions   None    ED Prescriptions   None    PDMP not reviewed this encounter.   Carlisle Beers, [place] 10/11/23 1333

## 2023-10-17 ENCOUNTER — Telehealth: Payer: Self-pay | Admitting: Cardiology

## 2023-10-17 NOTE — Telephone Encounter (Signed)
Pt c/o BP issue: STAT if pt c/o blurred vision, one-sided weakness or slurred speech  1. What are your last 5 BP readings?   11/18 - 9am  153/71            3:00 pm  153/77   11/19 - 9am 148/76             12:20pm  105/61             1:00pm   120/63             1:30 pm  126/69              2:45pm   116/675  2. Are you having any other symptoms (ex. Dizziness, headache, blurred vision, passed out)?  A little dizzy  3. What is your BP issue?   Patient stated his blood pressure has dropped way down and is going back up but patient is concerned this also happened a week ago.

## 2023-10-18 NOTE — Telephone Encounter (Signed)
Patient called to follow-up on his BP issue and stated he has not taken any medication today and will wait to hear back on next steps.

## 2023-10-18 NOTE — Telephone Encounter (Signed)
Spoke with pt. Regarding message. Dr. Bing Matter recommended that he stop the hydrochlorothiazide to see if it helps with his symptoms. Pt agreed and will send BP reading and updates. He verbalized understanding and had no other questions.

## 2023-12-27 DIAGNOSIS — R531 Weakness: Secondary | ICD-10-CM | POA: Insufficient documentation

## 2023-12-27 DIAGNOSIS — Z9181 History of falling: Secondary | ICD-10-CM

## 2023-12-27 DIAGNOSIS — M1A9XX0 Chronic gout, unspecified, without tophus (tophi): Secondary | ICD-10-CM | POA: Insufficient documentation

## 2023-12-27 HISTORY — DX: Chronic gout, unspecified, without tophus (tophi): M1A.9XX0

## 2023-12-27 HISTORY — DX: Weakness: R53.1

## 2023-12-27 HISTORY — DX: History of falling: Z91.81

## 2024-01-06 ENCOUNTER — Telehealth: Payer: Self-pay | Admitting: Physician Assistant

## 2024-01-06 DIAGNOSIS — I10 Essential (primary) hypertension: Secondary | ICD-10-CM

## 2024-01-06 MED ORDER — AMLODIPINE BESYLATE 5 MG PO TABS
5.0000 mg | ORAL_TABLET | Freq: Every day | ORAL | 0 refills | Status: DC
Start: 2024-01-06 — End: 2024-01-11

## 2024-01-06 NOTE — Telephone Encounter (Signed)
 Patient called the on-call answering service reporting ongoing issues with elevated BP readings following the discontinuation of hydrochlorothiazide  and Lasix . Currently taking lisinopril 40 mg daily. BP reading have predominantly been running in the 150s to 170s systolic and occasional readings into the 180s systolic. Heart rates have ranged from the 40s to 60s bpm. He is without symptoms of angina, dizziness, presyncope, syncope, or cardiac decompensation. He otherwise feels well and does not have complaints outside of concern over his BP. He has taken 20 mg of lisinopril around 12:30 AM today and another 20 mg several hours later. BP currently in the 160s systolic.   Recommendations: -Start amlodipine  5 mg daily today -Continue lisinopril 40 mg (next dose 2/9 since he has already taken 40 mg total today) -He is to call his primary cardiologist on 2/10 to discuss further

## 2024-01-09 ENCOUNTER — Telehealth: Payer: Self-pay | Admitting: Cardiology

## 2024-01-09 ENCOUNTER — Telehealth: Payer: Self-pay

## 2024-01-09 NOTE — Telephone Encounter (Signed)
STAT if HR is under 50 or over 120 (normal HR is 60-100 beats per minute)  What is your heart rate? 41  Do you have a log of your heart rate readings (document readings)? Jumping between 38 - 41   Do you have any other symptoms? Higher BP readings.

## 2024-01-09 NOTE — Telephone Encounter (Signed)
Spoke with pt. He stated that since his fluid medications were discontinued, the Lisinopril 40mg  alone does not seem to be helping keep his blood pressure reading down. He reported many readings ranging from 199/93- 157/72 over the last 3 days. HR ranging 38-56. He spoke with a PA on Saturday that added Amlodipine 5mg  daily. He stated that it doesn't seem to have helped much. Per Dr. Bing Matter increase Amlodipine to 10mg  daily and order 24 hour BP monitor.

## 2024-01-11 ENCOUNTER — Ambulatory Visit: Payer: Medicare Other | Attending: Cardiology | Admitting: Cardiology

## 2024-01-11 ENCOUNTER — Encounter: Payer: Self-pay | Admitting: Cardiology

## 2024-01-11 VITALS — BP 152/64 | HR 43 | Ht 69.0 in | Wt 237.0 lb

## 2024-01-11 DIAGNOSIS — I4892 Unspecified atrial flutter: Secondary | ICD-10-CM

## 2024-01-11 DIAGNOSIS — I420 Dilated cardiomyopathy: Secondary | ICD-10-CM | POA: Insufficient documentation

## 2024-01-11 DIAGNOSIS — I452 Bifascicular block: Secondary | ICD-10-CM | POA: Insufficient documentation

## 2024-01-11 DIAGNOSIS — I10 Essential (primary) hypertension: Secondary | ICD-10-CM | POA: Diagnosis not present

## 2024-01-11 HISTORY — DX: Unspecified atrial flutter: I48.92

## 2024-01-11 MED ORDER — APIXABAN 2.5 MG PO TABS: 2.5000 mg | ORAL_TABLET | Freq: Two times a day (BID) | ORAL | 3 refills | Status: DC

## 2024-01-11 MED ORDER — FUROSEMIDE 20 MG PO TABS: 20.0000 mg | ORAL_TABLET | Freq: Every day | ORAL | 3 refills | Status: DC

## 2024-01-11 MED ORDER — AMLODIPINE BESYLATE 5 MG PO TABS: 5.0000 mg | ORAL_TABLET | Freq: Every day | ORAL | 3 refills | Status: DC

## 2024-01-11 NOTE — Patient Instructions (Signed)
Medication Instructions:  Your physician has recommended you make the following change in your medication:   START: Lasix 20 mg daily START: Amlodipine 5 mg daily Stop: Aspirin START: Eliquis 2.5 mg two times daily  *If you need a refill on your cardiac medications before your next appointment, please call your pharmacy*   Lab Work: Your physician recommends that you return for lab work in:   Labs today: CBC, BMP, Stool for [employer] in 1 week: BMP  If you have labs (blood work) drawn today and your tests are completely normal, you will receive your results only by: MyChart Message (if you have MyChart) OR A paper copy in the mail If you have any lab test that is abnormal or we need to change your treatment, we will call you to review the results.   Testing/Procedures: None   Follow-Up: At [facility], you and your health needs are our priority.  As part of our continuing mission to provide you with exceptional heart care, we have created designated Provider Care Teams.  These Care Teams include your primary Cardiologist (physician) and Advanced Practice Providers (APPs -  Physician Assistants and Nurse Practitioners) who all work together to provide you with the care you need, when you need it.  We recommend signing up for the patient portal called "MyChart".  Sign up information is provided on this After Visit Summary.  MyChart is used to connect with patients for Virtual Visits (Telemedicine).  Patients are able to view lab/test results, encounter notes, upcoming appointments, etc.  Non-urgent messages can be sent to your provider as well.   To learn more about what you can do with MyChart, go to [email or web address].    Your next appointment:   1 month(s)  Provider:   Gypsy Balsam, MD    Other Instructions None

## 2024-01-11 NOTE — Progress Notes (Unsigned)
Cardiology Office Note:    Date:  01/11/2024   ID:  Phillip Neal, DOB 06/17/1931, MRN [id number]  PCP:  Gordan Payment., MD  Cardiologist:  Gypsy Balsam, MD    Referring MD: Gordan Payment., MD   No chief complaint on file.   History of Present Illness:    Phillip Neal is a 89 y.o. male past medical history significant for trifascicular block, essential hypertension, atypical chest pain stress test negative, he comes today to my office because recently he noticed increased he in his blood pressure.  He was given instruction to add amlodipine initially 5 and then 10 however that led to significant swelling of lower extremities.  I understand the reason for discontinuation of his diuretics was related to kidney dysfunction.  He comes today for follow-up overall he is at moderate He is doing fine but he complained of being much more short of breath and tired than before.  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 EF 50-55%. Phillip Neal.  Scar on ST 2019. PVC on monitor.   Chronic rhinitis 11/07/2018   Chronic venous insufficiency 07/28/2022   CKD (chronic kidney disease), stage III (HCC) 01/11/2016   Class 1 obesity due to excess calories with serious comorbidity and body mass index (BMI) of 33.0 to 33.9 in adult 03/22/2016   Cough last week   no phlegm jill dr Lequita Halt pa aware   Diabetes mellitus with stage 3 chronic kidney disease (HCC) 01/11/2016   Diabetes mellitus  without complication (HCC)    Dyspnea on exertion 04/01/2020   Elevated prostate specific antigen (PSA) 03/22/2016   Formatting of this note might be different from the original. No longer following.   Essential hypertension 01/11/2016   GERD without esophagitis 05/01/2018   Gouty arthropathy 03/22/2016   Hematest positive stools 01/18/2023   History of iron deficiency 05/28/2018   Formatting of this note might be different from the original. After surgeries. Follow. No longer anemic.   History of kidney stones    years ago, passed on own   Hyperlipidemia    Hypertension    Mixed hyperlipidemia 01/11/2016   Myalgia due to statin 08/13/2021   OA (osteoarthritis) of hip 01/31/2018   Onychomycosis 02/14/2022   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   Sensorineural hearing loss, bilateral 02/03/2023   Shortness of breath 01/21/2021   Strain of knee 11/25/2019   Syncope and collapse 01/01/2021   Vertigo    jan  2019 surgery rescheduled ent dr Christell Constant [place] no vertigo since end of january   Vitamin B12 deficiency 01/11/2016    Past Surgical History:  Procedure Laterality Date   colonscopy     with polyp removal   HERNIA REPAIR  1954   left inguinal hernia   TOTAL HIP ARTHROPLASTY Left 01/31/2018   Procedure: LEFT TOTAL HIP ARTHROPLASTY ANTERIOR APPROACH;  Surgeon: Ollen Gross, MD;  Location: [facility];  Service: Orthopedics;  Laterality: Left;   TOTAL HIP ARTHROPLASTY Right 05/16/2018  Procedure: RIGHT TOTAL HIP ARTHROPLASTY ANTERIOR APPROACH;  Surgeon: Ollen Gross, MD;  Location: [facility];  Service: Orthopedics;  Laterality: Right;    Current Medications: Current Meds  Medication Sig   Accu-Chek FastClix Lancets MISC 1 Lancet by Other route as directed.   ACCU-CHEK GUIDE TEST test strip 1 each by Other route daily.   allopurinol (ZYLOPRIM) 100 MG tablet Take 1 tablet by mouth daily.   amLODipine (NORVASC) 5 MG tablet Take 1 tablet (5 mg total) by mouth  daily.   aspirin EC 81 MG tablet Take 81 mg by mouth daily.   Cholecalciferol (VITAMIN D3) 25 MCG (1000 UT) CAPS Take 1 capsule by mouth daily.   clotrimazole-betamethasone (LOTRISONE) cream Apply 1 Application topically 2 (two) times daily.   Coenzyme Q10 (CO Q 10) 100 MG CAPS Take 100 mg by mouth 2 (two) times daily.   dicyclomine (BENTYL) 10 MG capsule Take 10 mg by mouth as needed for cramping.   ezetimibe (ZETIA) 10 MG tablet Take 10 mg by mouth daily.   fluticasone (FLONASE) 50 MCG/ACT nasal spray Place 2 sprays into both nostrils daily as needed for allergies or rhinitis.   lisinopril (PRINIVIL,ZESTRIL) 40 MG tablet Take 40 mg by mouth daily.    metFORMIN (GLUCOPHAGE-XR) 500 MG 24 hr tablet Take 1,000 mg by mouth 2 (two) times daily.    vitamin B-12 (CYANOCOBALAMIN) 1000 MCG tablet Take 1,000 mcg by mouth daily.     Allergies:   Sulfa antibiotics, Ciprofloxacin, Other, Statins, and Sulfamethoxazole   Social History   Socioeconomic History   Marital status: Widowed    Spouse name: Not on file   Number of children: Not on file   Years of education: Not on file   Highest education level: Not on file  Occupational History   Not on file  Tobacco Use   Smoking status: Never   Smokeless tobacco: Never  Vaping Use   Vaping status: Never Used  Substance and Sexual Activity   Alcohol use: Never   Drug use: Never   Sexual activity: Not on file  Other Topics Concern   Not on file  Social History Narrative   Not on file   Social Drivers of Health   Financial Resource Strain: Not on file  Food Insecurity: Low Risk  (08/22/2023)   Received from [facility]   Hunger Vital Sign    Worried About Running Out of Food in the Last Year: Never true    Ran Out of Food in the Last Year: Never true  Transportation Needs: No Transportation Needs (08/22/2023)   Received from [facility]    In the past 12 months, has lack of reliable transportation kept you from medical  appointments, meetings, work or from getting things needed for daily living? : No  Physical Activity: Not on file  Stress: Not on file  Social Connections: Not on file     Family History: [name] family history includes Heart failure in his mother. ROS:   Please see the history of present illness.    All 14 point review of systems negative except as described per history of present illness  EKGs/Labs/Other Studies Reviewed:         Recent Labs: No results found for requested labs within last 365 days.  Recent Lipid Panel No results found for: "CHOL", "TRIG", "HDL", "CHOLHDL", "VLDL", "LDLCALC", "LDLDIRECT"  Physical Exam:    VS:  BP (!) 152/64   Pulse (!) 43  Ht 5\' 9"  (1.753 m)   Wt 237 lb (107.5 kg)   SpO2 97%   BMI 35.00 kg/m     Wt Readings from Last 3 Encounters:  01/11/24 237 lb (107.5 kg)  08/18/23 223 lb (101.2 kg)  08/07/23 221 lb 6.4 oz (100.4 kg)     GEN:  Well nourished, well developed in no acute distress HEENT: Normal NECK: No JVD; No carotid bruits LYMPHATICS: No lymphadenopathy CARDIAC: RRR, no murmurs, no rubs, no gallops RESPIRATORY:  Clear to auscultation without rales, wheezing or rhonchi  ABDOMEN: Soft, non-tender, non-distended MUSCULOSKELETAL:  No edema; No deformity  SKIN: Warm and dry LOWER EXTREMITIES: no swelling NEUROLOGIC:  Alert and oriented x 3 PSYCHIATRIC:  Normal affect   ASSESSMENT:    1. Essential hypertension   2. Paroxysmal atrial flutter (HCC)   3. Dilated cardiomyopathy (HCC)   4. Bifascicular bundle branch block    PLAN:    In order of problems listed above:  Atrial flutter EKG revealed today controlled ventricular rate 51.  That is a new discovery.  New diagnosis.  Obviously he need to be anticoagulated.  I will check his CBC stool for guaiac will give him samples of Eliquis discontinue aspirin and start giving Eliquis 2.5 mg twice daily.  He is more than 89 years old and his kidney function is more than  creatinine more than 1.5.  Continue watching CBC.  I will bring him back to the office in about 1 month to see if he converted to sinus rhythm if not then we will talk about potential cardioversion. Essential hypertension I will cut down his amlodipine to only 5 mg daily, will add 20 mg of Lasix, will check Chem-7 today we will repeat Chem-7 next week. History of cardiomyopathy, last echocardiogram done in October of last year showed normal ejection fraction. Bifascicular bundle branch block.  Luckily asymptomatic   Medication Adjustments/Labs and Tests Ordered: Current medicines are reviewed at length with the patient today.  Concerns regarding medicines are outlined above.  Orders Placed This Encounter  Procedures   EKG 12-Lead   Medication changes: No orders of the defined types were placed in this encounter.   Signed, Georgeanna Lea, MD, [facility] 01/11/2024 9:23 AM     [facility]

## 2024-01-12 LAB — CBC
Hematocrit: 39.9 % (ref 37.5–51.0)
Hemoglobin: 13.1 g/dL (ref 13.0–17.7)
MCH: 33 pg (ref 26.6–33.0)
MCHC: 32.8 g/dL (ref 31.5–35.7)
MCV: 101 fL — ABNORMAL HIGH (ref 79–97)
Platelets: 235 10*3/uL (ref 150–450)
RBC: 3.97 x10E6/uL — ABNORMAL LOW (ref 4.14–5.80)
RDW: 12 % (ref 11.6–15.4)
WBC: 9.5 10*3/uL (ref 3.4–10.8)

## 2024-01-12 LAB — BASIC METABOLIC PANEL
BUN/Creatinine Ratio: 17 (ref 10–24)
BUN: 27 mg/dL (ref 10–36)
CO2: 21 mmol/L (ref 20–29)
Calcium: 9.3 mg/dL (ref 8.6–10.2)
Chloride: 108 mmol/L — ABNORMAL HIGH (ref 96–106)
Creatinine, Ser: 1.6 mg/dL — ABNORMAL HIGH (ref 0.76–1.27)
Glucose: 111 mg/dL — ABNORMAL HIGH (ref 70–99)
Potassium: 4.5 mmol/L (ref 3.5–5.2)
Sodium: 144 mmol/L (ref 134–144)
eGFR: 41 mL/min/{1.73_m2} — ABNORMAL LOW (ref >59)

## 2024-01-14 ENCOUNTER — Telehealth: Payer: Self-pay | Admitting: Physician Assistant

## 2024-01-14 LAB — FECAL OCCULT BLOOD, IMMUNOCHEMICAL: Fecal Occult Bld: POSITIVE — AB

## 2024-01-14 NOTE — Telephone Encounter (Signed)
Patient called because his lower extremity edema has gotten worse.  He saw Dr. Shary Decamp, his PCP and at that time, he was having feet and ankle edema.  Dr. Shary Decamp told him to call if it got worse.  It got worse, and he saw Dr. Bing Matter.  Dr. Bing Matter restarted his Lasix, but did it by mail order so he has not yet received the medication.  He has some old Lasix 20 mg tablets and took those.  After the first 1, he urinated a great deal, but does not feel like he urinated much after the next 2 doses.  He states that his lower extremity edema is spreading into his thighs.  I had him weigh during our conversation and his weight is up 14 pounds from 08/18/2023.  Mr. Lalli states the weight gain has occurred in the last few weeks.  At this time, he does not have presyncope or syncope.  We discussed options of letting him stay home and wait to get the Lasix by mail order probably tomorrow, versus going to the emergency room.  Because of the amount of weight gain, and the lack of response to the previous Lasix tablets, I greatly prefer that he go to the emergency room as he probably needs some IV Lasix and possible admission.  I will route this note to Dr. Bing Matter so he can check on him tomorrow.  Theodore Demark, PA-C 01/14/2024 1:19 PM

## 2024-01-15 ENCOUNTER — Encounter: Payer: Self-pay | Admitting: Cardiology

## 2024-01-15 ENCOUNTER — Encounter (HOSPITAL_BASED_OUTPATIENT_CLINIC_OR_DEPARTMENT_OTHER): Payer: Self-pay | Admitting: Emergency Medicine

## 2024-01-15 ENCOUNTER — Other Ambulatory Visit: Payer: Self-pay

## 2024-01-15 ENCOUNTER — Emergency Department (HOSPITAL_BASED_OUTPATIENT_CLINIC_OR_DEPARTMENT_OTHER): Payer: Medicare Other

## 2024-01-15 ENCOUNTER — Emergency Department (HOSPITAL_BASED_OUTPATIENT_CLINIC_OR_DEPARTMENT_OTHER)
Admission: EM | Admit: 2024-01-15 | Discharge: 2024-01-16 | Disposition: A | Discharge status: Home / Self Care | Payer: Medicare Other | Attending: Emergency Medicine | Admitting: Emergency Medicine

## 2024-01-15 DIAGNOSIS — I483 Typical atrial flutter: Secondary | ICD-10-CM | POA: Insufficient documentation

## 2024-01-15 DIAGNOSIS — Z7984 Long term (current) use of oral hypoglycemic drugs: Secondary | ICD-10-CM | POA: Insufficient documentation

## 2024-01-15 DIAGNOSIS — R0789 Other chest pain: Secondary | ICD-10-CM | POA: Diagnosis present

## 2024-01-15 DIAGNOSIS — Z79899 Other long term (current) drug therapy: Secondary | ICD-10-CM | POA: Insufficient documentation

## 2024-01-15 DIAGNOSIS — M7989 Other specified soft tissue disorders: Secondary | ICD-10-CM | POA: Diagnosis not present

## 2024-01-15 DIAGNOSIS — I10 Essential (primary) hypertension: Secondary | ICD-10-CM | POA: Insufficient documentation

## 2024-01-15 DIAGNOSIS — R600 Localized edema: Secondary | ICD-10-CM | POA: Insufficient documentation

## 2024-01-15 LAB — BRAIN NATRIURETIC PEPTIDE: B Natriuretic Peptide: 106.5 pg/mL — ABNORMAL HIGH (ref 0.0–100.0)

## 2024-01-15 LAB — TROPONIN I (HIGH SENSITIVITY): Troponin I (High Sensitivity): 14 ng/L

## 2024-01-15 LAB — CBC
HCT: 41.8 % (ref 39.0–52.0)
Hemoglobin: 13.7 g/dL (ref 13.0–17.0)
MCH: 33.4 pg (ref 26.0–34.0)
MCHC: 32.8 g/dL (ref 30.0–36.0)
MCV: 102 fL — ABNORMAL HIGH (ref 80.0–100.0)
Platelets: 232 10*3/uL (ref 150–400)
RBC: 4.1 MIL/uL — ABNORMAL LOW (ref 4.22–5.81)
RDW: 13.2 % (ref 11.5–15.5)
WBC: 9 10*3/uL (ref 4.0–10.5)
nRBC: 0 % (ref 0.0–0.2)

## 2024-01-15 LAB — BASIC METABOLIC PANEL
Anion gap: 10 (ref 5–15)
BUN: 31 mg/dL — ABNORMAL HIGH (ref 8–23)
CO2: 26 mmol/L (ref 22–32)
Calcium: 9.4 mg/dL (ref 8.9–10.3)
Chloride: 103 mmol/L (ref 98–111)
Creatinine, Ser: 1.74 mg/dL — ABNORMAL HIGH (ref 0.61–1.24)
GFR, Estimated: 37 mL/min — ABNORMAL LOW (ref >60)
Glucose, Bld: 173 mg/dL — ABNORMAL HIGH (ref 70–99)
Potassium: 3.8 mmol/L (ref 3.5–5.1)
Sodium: 139 mmol/L (ref 135–145)

## 2024-01-15 MED ORDER — APIXABAN 2.5 MG PO TABS: 2.5000 mg | ORAL_TABLET | Freq: Two times a day (BID) | ORAL | 0 refills | Status: DC

## 2024-01-15 MED ORDER — APIXABAN 2.5 MG PO TABS
2.5000 mg | ORAL_TABLET | Freq: Once | ORAL | Status: AC
  Administered 2024-01-15: 2.5 mg via ORAL
  Filled 2024-01-15: qty 1

## 2024-01-15 NOTE — ED Notes (Signed)
 ED Provider at bedside.

## 2024-01-15 NOTE — Telephone Encounter (Signed)
Pt's daughter Lupita Leash is requesting a callback at [phone] to f/u on hearing something back due to them being very concerned about the situation. Please advise

## 2024-01-15 NOTE — ED Provider Notes (Signed)
[place] [facility] AT [facility] Provider Note   CSN: [id number] Arrival date & time: 01/15/24  1610     History  Chief Complaint  Patient presents with   Bradycardia    Phillip Neal is a 89 y.o. male.  Patient lives in the [place] area.  Followed by cardiology.  Saw them on February 13.  Patient's medical history significant for trifascicular block essential hypertension atypical chest pain stress test was negative was seen for that day for high blood pressure.  And also noted to have increased swelling in his lower extremities.  Patient had been on Lasix in the past.  But they stopped it because it was interfering with his kidney function.  Evaluation that day noted that he was in atrial flutter with rate control.  They mention that they wanted to start him on Eliquis that they wanted to start him on amlodipine and Lasix 20 mg daily.  Things were sent to a mail order pharmacy.  And those prescriptions have not come in yet.  He did have some old Lasix at home and he has been taking that.  Patient very concerned about increasing leg swelling which she did mention to his cardiologist when they saw him on the 13th.  They did make note of it.  But they state that they are having trouble controlling his kidney function on more Lasix.  Patient denies any chest pain exertional shortness of breath or feeling like he is going to pass out.  Also denies heart rate going fast.  He is very concerned about the leg swelling which is increased and now moving up into his thighs and is concerned that it will move into his lungs.  I did contact cardiology today and they recommended that he come in for evaluation.  In addition patient has been very worried about his heart rate sometimes being in the 40s most of the times in the 60s based on her cardiac monitor here.  He has been getting up multiple times at night and checking his heart rate.  Currently his blood pressure is improving.   Patient mentally is very alert.  Intact.  Able to talk without any shortness of breath.  Patient denies any shortness of breath with exertion.       Home Medications Prior to Admission medications   Medication Sig Start Date End Date Taking? Authorizing Provider  Accu-Chek FastClix Lancets MISC 1 Lancet by Other route as directed. 11/26/23   [provider]  ACCU-CHEK GUIDE TEST test strip 1 each by Other route daily. 12/08/23   [provider]  allopurinol (ZYLOPRIM) 100 MG tablet Take 1 tablet by mouth daily. 12/29/23   [provider]  amLODipine (NORVASC) 5 MG tablet Take 1 tablet (5 mg total) by mouth daily. 01/11/24   Georgeanna Lea, MD  apixaban (ELIQUIS) 2.5 MG TABS tablet Take 1 tablet (2.5 mg total) by mouth 2 (two) times daily. 01/11/24   Georgeanna Lea, MD  Cholecalciferol (VITAMIN D3) 25 MCG (1000 UT) CAPS Take 1 capsule by mouth daily.    [provider]  clotrimazole-betamethasone (LOTRISONE) cream Apply 1 Application topically 2 (two) times daily. 12/27/23   [provider]  Coenzyme Q10 (CO Q 10) 100 MG CAPS Take 100 mg by mouth 2 (two) times daily.    [provider]  dicyclomine (BENTYL) 10 MG capsule Take 10 mg by mouth as needed for cramping. 04/30/21   [provider]  ezetimibe (ZETIA)  10 MG tablet Take 10 mg by mouth daily.    [provider]  fluticasone (FLONASE) 50 MCG/ACT nasal spray Place 2 sprays into both nostrils daily as needed for allergies or rhinitis.    [provider]  furosemide (LASIX) 20 MG tablet Take 1 tablet (20 mg total) by mouth daily. 01/11/24   Georgeanna Lea, MD  lisinopril (PRINIVIL,ZESTRIL) 40 MG tablet Take 40 mg by mouth daily.  09/19/17   [provider]  metFORMIN (GLUCOPHAGE-XR) 500 MG 24 hr tablet Take 1,000 mg by mouth 2 (two) times daily.  09/19/17   [provider]  vitamin B-12 (CYANOCOBALAMIN) 1000 MCG tablet Take 1,000 mcg by  mouth daily.    [provider]      Allergies    Sulfa antibiotics, Ciprofloxacin, Other, Statins, and Sulfamethoxazole    Review of Systems   Review of Systems  Constitutional:  Negative for chills and fever.  HENT:  Negative for ear pain and sore throat.   Eyes:  Negative for pain and visual disturbance.  Respiratory:  Negative for cough and shortness of breath.   Cardiovascular:  Positive for leg swelling. Negative for chest pain and palpitations.  Gastrointestinal:  Negative for abdominal pain and vomiting.  Genitourinary:  Negative for dysuria and hematuria.  Musculoskeletal:  Negative for arthralgias and back pain.  Skin:  Negative for color change and rash.  Neurological:  Negative for seizures and syncope.  All other systems reviewed and are negative.   Physical Exam Updated Vital Signs BP (!) 172/83   Pulse (!) 57   Temp 97.7 F (36.5 C) (Oral)   Resp 16   Wt 107 kg   SpO2 97%   BMI 34.84 kg/m  Physical Exam Vitals and nursing note reviewed.  Constitutional:      General: He is not in acute distress.    Appearance: Normal appearance. He is well-developed. He is not ill-appearing.  HENT:     Head: Normocephalic and atraumatic.  Eyes:     Conjunctiva/sclera: Conjunctivae normal.  Cardiovascular:     Rate and Rhythm: Bradycardia present. Rhythm irregular.     Heart sounds: No murmur heard. Pulmonary:     Effort: Pulmonary effort is normal. No respiratory distress.     Breath sounds: Normal breath sounds. No wheezing, rhonchi or rales.  Abdominal:     Palpations: Abdomen is soft.     Tenderness: There is no abdominal tenderness.  Musculoskeletal:        General: No swelling.     Cervical back: Neck supple.     Right lower leg: Edema present.     Left lower leg: Edema present.  Skin:    General: Skin is warm and dry.     Capillary Refill: Capillary refill takes less than 2 seconds.  Neurological:     General: No focal deficit present.      Mental Status: He is alert and oriented to person, place, and time.  Psychiatric:        Mood and Affect: Mood normal.     ED Results / Procedures / Treatments   Labs (all labs ordered are listed, but only abnormal results are displayed) Labs Reviewed  BASIC METABOLIC PANEL - Abnormal; Notable for the following components:      Result Value   Glucose, Bld 173 (*)    BUN 31 (*)    Creatinine, Ser 1.74 (*)    GFR, Estimated 37 (*)    All other components  within normal limits  CBC - Abnormal; Notable for the following components:   RBC 4.10 (*)    MCV 102.0 (*)    All other components within normal limits  BRAIN NATRIURETIC PEPTIDE - Abnormal; Notable for the following components:   B Natriuretic Peptide 106.5 (*)    All other components within normal limits  TROPONIN I (HIGH SENSITIVITY)  TROPONIN I (HIGH SENSITIVITY)    EKG None  Radiology DG Chest 2 View Result Date: 01/15/2024 CLINICAL DATA:  Bradycardia.  Shortness of breath. EXAM: CHEST - 2 VIEW COMPARISON:  Chest radiograph dated 04/01/2020. FINDINGS: Trace bilateral pleural effusions with bibasilar atelectasis. No focal consolidation or pneumothorax. The cardiac silhouette is within normal limits. No acute osseous pathology. Degenerative changes of the spine. IMPRESSION: Trace bilateral pleural effusions with bibasilar atelectasis. Electronically Signed   By: Elgie Collard M.D.   On: 01/15/2024 18:53    Procedures Procedures    Medications Ordered in ED Medications  apixaban (ELIQUIS) tablet 2.5 mg (has no administration in time range)    ED Course/ Medical Decision Making/ A&P                                 Medical Decision Making Amount and/or Complexity of Data Reviewed Labs: ordered. Radiology: ordered.  Risk Prescription drug management.   Long conversation with the patient.  Patient's labs here today are reassuring but GFR at 37 is a little bit worse already showing some effect from the 20 mg  of Lasix a day.  Creatinine 1.74.  Patient's BMP was reassuring here today at 106.5.  Up a little bit but certainly not markedly elevated chest x-ray has some trace bilateral pleural effusions but does not seem to be consistent with pulmonary edema.  Patient's EKG was consistent with atrial flutter with a 4-1 block.  Reviewing old EKGs patient has been in flutter before but it sounds as if cardiology just became aware of it with the visit on the 13th.  Overall patient very stable.  Unfortunately patient has not been on the Eliquis will give him a dose here tonight and then give him a short prescription until his mail order prescription comes in.  He does have Lasix to take at home.  Would recommend bumping up a little bit just for a one-time dose of 40 mg tomorrow morning.  Then back to 20 mg daily.  And patient is on the amlodipine.  Commend that he give his cardiology office a call for follow-up they may want to wait a week so that they can check labs again.  And for consideration for elective cardioversion.  Is possible that the cardioversion could help with the fluid management.   Final Clinical Impression(s) / ED Diagnoses Final diagnoses:  Typical atrial flutter (HCC)  Leg swelling  Primary hypertension    Rx / DC Orders ED Discharge Orders     None         Vanetta Mulders, MD 01/15/24 2351

## 2024-01-15 NOTE — ED Triage Notes (Signed)
Reports bradycardia , and  bilateral leg edema x 3 days . No Dx CHF . Shortness of breath at rest . Sleeps in his chair he said . Denies chest pain

## 2024-01-15 NOTE — Telephone Encounter (Signed)
Called the patient's daughter and she reported that the patient's lasix has not arrived from the mail order pharmacy and his swelling in his lower extremities is not just in his feet and calves. Now it has progressed into his upper legs and his pant's are becoming tight. The daughter stated that the patient had gained 14 pounds since last September. The daughter was able to get "some other Lasix" and he did diures a few pounds. But since taking that medication he has not been diuresing as well. The daughter also stated that his heart rate had been running in the 40's. Based on Rhonda Barrett's recommendation I recommended that he go to the ER to be evaluated. The daughter asked if his Lasix could be increased to keep him out of the ER. I explained that I would follow up with Dr. Vincent Gros who was covering for Dr. Bing Matter.

## 2024-01-15 NOTE — Telephone Encounter (Signed)
Spoke with pt and his daughter who are concerned with the edema and low heart rate. Pt's heart rate ranges from 34 to 54. Advised to go to the ED for evaluation as his kidney functions were abnormal 4 days ago and concern with diuretics causing them to worsen. Both agreed with the plan and had no additional questions.

## 2024-01-15 NOTE — ED Notes (Signed)
Pt. Here tonight due to his legs are swelling from the feet to the knees.  Pt. Also reports his pulse is dropping with meds he is taking.

## 2024-01-15 NOTE — Telephone Encounter (Signed)
Sharilyn Sites, the patient's daughter and informed her of Dr. Madireddy's recommendation for the patient to go to the ER. Jasmine December stated that they had already taken him to the ER and he was being treated at [facility]. Jasmine December was appreciative for the call and had no further questions at this time.

## 2024-01-15 NOTE — Discharge Instructions (Addendum)
Start the Eliquis twice a day.  New prescription provided.  When you get your mail order prescriptions take the Eliquis as directed.  Continue taking your amlodipine 5 mg daily.  Stop your aspirin.  Take the Lasix 20 mg a day.  But would recommend that you take 40 mg tomorrow morning.  Then give your cardiologist on-call for follow-up.  Return for feeling like you are going to pass out return for shortness of breath particularly with exertion.  Return for your heart rate going fast.  Record your blood pressure daily along with your heart rate and keep a log book.  I would talk to your cardiologist about consideration for cardioversion out of the atrial flutter even though you have rate control.  It could help with the fluid management.

## 2024-01-16 ENCOUNTER — Telehealth: Payer: Self-pay

## 2024-01-16 DIAGNOSIS — I4892 Unspecified atrial flutter: Secondary | ICD-10-CM

## 2024-01-16 NOTE — Telephone Encounter (Signed)
Patient is returning call and is requesting return call.

## 2024-01-16 NOTE — Telephone Encounter (Signed)
LVM and My Chart Message per DPR- per Dr. Vanetta Shawl note regarding  lab results. Encouraged to call to discuss results. Routed to PCP.

## 2024-01-18 NOTE — Telephone Encounter (Signed)
 Pt returning call, requesting cb

## 2024-01-19 NOTE — Telephone Encounter (Signed)
Patient is returning call. Transferred to Misty Stanley, Charity fundraiser.

## 2024-01-19 NOTE — Telephone Encounter (Signed)
 LVM to call regarding message.

## 2024-01-19 NOTE — Addendum Note (Signed)
Addended by: Baldo Ash D on: 01/19/2024 12:50 PM   Modules accepted: Orders

## 2024-01-19 NOTE — Telephone Encounter (Signed)
Pt would like to repeat Stool for blood test as he was constipated last time he did the test and it showed blood in his stool. Per Dr. Bing Matter test ordered.

## 2024-01-23 LAB — BASIC METABOLIC PANEL
BUN/Creatinine Ratio: 19 (ref 10–24)
BUN: 27 mg/dL (ref 10–36)
CO2: 23 mmol/L (ref 20–29)
Calcium: 9.2 mg/dL (ref 8.6–10.2)
Chloride: 107 mmol/L — ABNORMAL HIGH (ref 96–106)
Creatinine, Ser: 1.44 mg/dL — ABNORMAL HIGH (ref 0.76–1.27)
Glucose: 208 mg/dL — ABNORMAL HIGH (ref 70–99)
Potassium: 4.4 mmol/L (ref 3.5–5.2)
Sodium: 145 mmol/L — ABNORMAL HIGH (ref 134–144)
eGFR: 46 mL/min/{1.73_m2} — ABNORMAL LOW (ref >59)

## 2024-01-24 ENCOUNTER — Telehealth: Payer: Self-pay | Admitting: Cardiology

## 2024-01-24 ENCOUNTER — Other Ambulatory Visit: Payer: Self-pay

## 2024-01-24 LAB — FECAL OCCULT BLOOD, IMMUNOCHEMICAL: Fecal Occult Bld: POSITIVE — AB

## 2024-01-24 MED ORDER — AMLODIPINE BESYLATE 5 MG PO TABS: 5.0000 mg | ORAL_TABLET | Freq: Every day | ORAL | 3 refills | Status: DC

## 2024-01-24 NOTE — Telephone Encounter (Signed)
 Amlodipine 5mg  #15 sent to [facility] until Mail order arrives.

## 2024-01-24 NOTE — Telephone Encounter (Signed)
*  STAT* If patient is at the pharmacy, call can be transferred to refill team.   1. Which medications need to be refilled? (please list name of each medication and dose if known) amLODipine (NORVASC) 5 MG tablet    2. Would you like to learn more about the convenience, safety, & potential cost savings by using the [facility]? No   3. Are you open to using the [facility] (Type [facility].) No   4. Which pharmacy/location (including street and city if local pharmacy) is medication to be sent to? [facility] - [place], [place] - [address]    5. Do they need a 30 day or 90 day supply? 10 day    Pt needs enough to last until mail order comes

## 2024-01-26 ENCOUNTER — Telehealth: Payer: Self-pay

## 2024-01-26 DIAGNOSIS — R195 Other fecal abnormalities: Secondary | ICD-10-CM

## 2024-01-26 NOTE — Telephone Encounter (Signed)
 Spoke with pt regarding lab results. Pt verbalized understanding and will come for labs next week. Also per [facility] note recommended that pt continue to check blood pressures and bring readings by. Pt agreed and had no further questions. Routed to PCP.

## 2024-02-03 LAB — CBC
Hematocrit: 41.9 % (ref 37.5–51.0)
Hemoglobin: 14 g/dL (ref 13.0–17.7)
MCH: 33.7 pg — ABNORMAL HIGH (ref 26.6–33.0)
MCHC: 33.4 g/dL (ref 31.5–35.7)
MCV: 101 fL — ABNORMAL HIGH (ref 79–97)
Platelets: 275 x10E3/uL (ref 150–450)
RBC: 4.15 x10E6/uL (ref 4.14–5.80)
RDW: 12.2 % (ref 11.6–15.4)
WBC: 8.7 x10E3/uL (ref 3.4–10.8)

## 2024-02-05 ENCOUNTER — Telehealth: Payer: Self-pay

## 2024-02-05 NOTE — Telephone Encounter (Signed)
 Left message on My Chart per Dr. Jimmy Footman note regarding results. Routed to PCP.

## 2024-02-05 NOTE — Telephone Encounter (Signed)
Pt viewed results on My Chart per [facility] note. Routed to PCP

## 2024-02-14 ENCOUNTER — Encounter: Payer: Self-pay | Admitting: Cardiology

## 2024-02-19 ENCOUNTER — Ambulatory Visit: Payer: Medicare Other | Attending: Cardiology | Admitting: Cardiology

## 2024-02-19 VITALS — BP 162/60 | HR 72 | Ht 69.0 in | Wt 230.0 lb

## 2024-02-19 DIAGNOSIS — I4892 Unspecified atrial flutter: Secondary | ICD-10-CM | POA: Diagnosis not present

## 2024-02-19 DIAGNOSIS — I420 Dilated cardiomyopathy: Secondary | ICD-10-CM | POA: Insufficient documentation

## 2024-02-19 DIAGNOSIS — R0609 Other forms of dyspnea: Secondary | ICD-10-CM | POA: Diagnosis not present

## 2024-02-19 DIAGNOSIS — I10 Essential (primary) hypertension: Secondary | ICD-10-CM | POA: Diagnosis not present

## 2024-02-19 DIAGNOSIS — I452 Bifascicular block: Secondary | ICD-10-CM | POA: Diagnosis present

## 2024-02-19 NOTE — Patient Instructions (Addendum)
 Medication Instructions:  Your physician recommends that you continue on your current medications as directed. Please refer to the Current Medication list given to you today.  *If you need a refill on your cardiac medications before your next appointment, please call your pharmacy*   Lab Work: CBC, ProBNP, BMP- today If you have labs (blood work) drawn today and your tests are completely normal, you will receive your results only by: MyChart Message (if you have MyChart) OR A paper copy in the mail If you have any lab test that is abnormal or we need to change your treatment, we will call you to review the results.   Testing/Procedures: Your physician has requested that you have an echocardiogram. Echocardiography is a painless test that uses sound waves to create images of your heart. It provides your doctor with information about the size and shape of your heart and how well your heart's chambers and valves are working. This procedure takes approximately one hour. There are no restrictions for this procedure. Please do NOT wear cologne, perfume, aftershave, or lotions (deodorant is allowed). Please arrive 15 minutes prior to your appointment time.  Please note: We ask at that you not bring children with you during ultrasound (echo/ vascular) testing. Due to room size and safety concerns, children are not allowed in the ultrasound rooms during exams. Our front office staff cannot provide observation of children in our lobby area while testing is being conducted. An adult accompanying a patient to their appointment will only be allowed in the ultrasound room at the discretion of the ultrasound technician under special circumstances. We apologize for any inconvenience.    Follow-Up: At [facility], you and your health needs are our priority.  As part of our continuing mission to provide you with exceptional heart care, we have created designated Provider Care Teams.  These Care Teams include  your primary Cardiologist (physician) and Advanced Practice Providers (APPs -  Physician Assistants and Nurse Practitioners) who all work together to provide you with the care you need, when you need it.  We recommend signing up for the patient portal called "MyChart".  Sign up information is provided on this After Visit Summary.  MyChart is used to connect with patients for Virtual Visits (Telemedicine).  Patients are able to view lab/test results, encounter notes, upcoming appointments, etc.  Non-urgent messages can be sent to your provider as well.   To learn more about what you can do with MyChart, go to [email or web address].    Your next appointment:   1 month(s)  The format for your next appointment:   In Person  Provider:   Gypsy Balsam, MD    Other Instructions NA

## 2024-02-19 NOTE — Progress Notes (Signed)
 Cardiology Office Note:    Date:  02/19/2024   ID:  Phillip Neal, DOB 1931-07-06, MRN [id number]  PCP:  Georgeanna Lea, MD  Cardiologist:  Gypsy Balsam, MD    Referring MD: Gordan Payment., MD   Chief Complaint  Patient presents with   Follow-up    History of Present Illness:    Phillip Neal is a 89 y.o. male past medical history significant for trifascicular block, essential hypertension, atypical chest pain with negative stress test, he presented to my office about a month ago he was found to be in atrial flutter with controlled ventricular rate, also had difficulty with blood pressure and amlodipine has been increased which resulted with swelling of lower extremities at the same time we have difficulty putting him on appropriate dose of diuretic because of kidney dysfunction.  Comes today to months for follow-up, since have seen him last time he end up going to the emergency room and the visit for emergency room was the fact that he did not get his medications right away.  Finally he was put on Eliquis finally he was put on diuretics however lower extremities still swollen.  He is rate is controlled atrial flutter is still going on.  I had a long discussion with him what to do with the situation diffuse issues that still need to be solved.  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 EF 50-55%. Zyla Dascenzo.  Scar on ST  2019. PVC on monitor.   Chronic gout involving toe of left foot without tophus 12/27/2023   Chronic rhinitis 11/07/2018   Chronic venous insufficiency 07/28/2022   CKD (chronic kidney disease), stage III (HCC) 01/11/2016   Class 1 obesity due to excess calories with serious comorbidity and body mass index (BMI) of 33.0 to 33.9 in adult 03/22/2016   Cough last week   no phlegm jill dr Lequita Halt pa aware   Diabetes mellitus with stage 3 chronic kidney disease (HCC) 01/11/2016   Diabetes mellitus without complication (HCC)    Dyspnea on exertion 04/01/2020   Elevated prostate specific antigen (PSA) 03/22/2016   Formatting of this note might be different from the original. No longer following.   Essential hypertension 01/11/2016   GERD without esophagitis 05/01/2018   Gouty arthropathy 03/22/2016   Hematest positive stools 01/18/2023   History of iron deficiency 05/28/2018   Formatting of this note might be different from the original. After surgeries. Follow. No longer anemic.   History of kidney stones    years ago, passed on own   Hyperlipidemia    Hypertension    Mixed hyperlipidemia 01/11/2016   Myalgia due to statin 08/13/2021   OA (osteoarthritis) of hip 01/31/2018   Onychomycosis 02/14/2022   Paroxysmal atrial flutter (HCC) 01/11/2024   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   Sensorineural hearing loss, bilateral 02/03/2023   Shortness of breath 01/21/2021   Strain of knee 11/25/2019  Syncope and collapse 01/01/2021   Vertigo    jan  2019 surgery rescheduled ent dr Christell Constant [place] no vertigo since end of january   Vitamin B12 deficiency 01/11/2016   Weakness 12/27/2023    Past Surgical History:  Procedure Laterality Date   colonscopy     with polyp removal   HERNIA REPAIR  1954   left inguinal hernia   TOTAL HIP ARTHROPLASTY Left 01/31/2018   Procedure: LEFT TOTAL HIP ARTHROPLASTY ANTERIOR APPROACH;  Surgeon: Ollen Gross, MD;  Location: [facility];   Service: Orthopedics;  Laterality: Left;   TOTAL HIP ARTHROPLASTY Right 05/16/2018   Procedure: RIGHT TOTAL HIP ARTHROPLASTY ANTERIOR APPROACH;  Surgeon: Ollen Gross, MD;  Location: [facility];  Service: Orthopedics;  Laterality: Right;    Current Medications: Current Meds  Medication Sig   Accu-Chek FastClix Lancets MISC 1 Lancet by Other route as directed.   ACCU-CHEK GUIDE TEST test strip 1 each by Other route daily.   allopurinol (ZYLOPRIM) 100 MG tablet Take 1 tablet by mouth daily.   amLODipine (NORVASC) 5 MG tablet Take 1 tablet (5 mg total) by mouth daily.   apixaban (ELIQUIS) 2.5 MG TABS tablet Take 1 tablet (2.5 mg total) by mouth 2 (two) times daily.   apixaban (ELIQUIS) 2.5 MG TABS tablet Take 1 tablet (2.5 mg total) by mouth 2 (two) times daily.   Cholecalciferol (VITAMIN D3) 25 MCG (1000 UT) CAPS Take 1 capsule by mouth daily.   clotrimazole-betamethasone (LOTRISONE) cream Apply 1 Application topically 2 (two) times daily.   Coenzyme Q10 (CO Q 10) 100 MG CAPS Take 100 mg by mouth 2 (two) times daily.   dicyclomine (BENTYL) 10 MG capsule Take 10 mg by mouth as needed for cramping.   ezetimibe (ZETIA) 10 MG tablet Take 10 mg by mouth daily.   fluconazole (DIFLUCAN) 200 MG tablet Take 200 mg by mouth once a week.   fluticasone (FLONASE) 50 MCG/ACT nasal spray Place 2 sprays into both nostrils daily as needed for allergies or rhinitis.   furosemide (LASIX) 20 MG tablet Take 1 tablet (20 mg total) by mouth daily.   lisinopril (PRINIVIL,ZESTRIL) 40 MG tablet Take 40 mg by mouth daily.    metFORMIN (GLUCOPHAGE-XR) 500 MG 24 hr tablet Take 1,000 mg by mouth 2 (two) times daily.    vitamin B-12 (CYANOCOBALAMIN) 1000 MCG tablet Take 1,000 mcg by mouth daily.     Allergies:   Sulfa antibiotics, Ciprofloxacin, Other, Statins, and Sulfamethoxazole   Social History   Socioeconomic History   Marital status: Widowed    Spouse name: Not on file   Number of children: Not on file   Years  of education: Not on file   Highest education level: Not on file  Occupational History   Not on file  Tobacco Use   Smoking status: Never   Smokeless tobacco: Never  Vaping Use   Vaping status: Never Used  Substance and Sexual Activity   Alcohol use: Never   Drug use: Never   Sexual activity: Not on file  Other Topics Concern   Not on file  Social History Narrative   Not on file   Social Drivers of Health   Financial Resource Strain: Not on file  Food Insecurity: Low Risk  (08/22/2023)   Received from [facility]   Hunger Vital Sign    Worried About Running Out of Food in the Last Year: Never true    Ran Out of Food in the Last Year: Never  true  Transportation Needs: No Transportation Needs (08/22/2023)   Received from [employer]    In the past 12 months, has lack of reliable transportation kept you from medical appointments, meetings, work or from getting things needed for daily living? : No  Physical Activity: Not on file  Stress: Not on file  Social Connections: Not on file     Family History: [name] family history includes Heart failure in his mother. ROS:   Please see the history of present illness.    All 14 point review of systems negative except as described per history of present illness  EKGs/Labs/Other Studies Reviewed:    EKG Interpretation Date/Time:  Monday February 19 2024 09:19:11 EDT Ventricular Rate:  58 PR Interval:    QRS Duration:  142 QT Interval:  468 QTC Calculation: 459 R Axis:   -55  Text Interpretation: Atrial flutter with variable A-V block Left axis deviation Right bundle branch block Left ventricular hypertrophy Abnormal ECG When compared with ECG of 15-Jan-2024 16:20, PREVIOUS ECG IS PRESENT Confirmed by Gypsy Balsam [phone]) on 02/19/2024 9:50:42 AM    Recent Labs: 01/15/2024: B Natriuretic Peptide 106.5 01/22/2024: BUN 27; Creatinine, Ser 1.44; Potassium 4.4; Sodium 145 02/02/2024: Hemoglobin 14.0;  Platelets 275  Recent Lipid Panel No results found for: "CHOL", "TRIG", "HDL", "CHOLHDL", "VLDL", "LDLCALC", "LDLDIRECT"  Physical Exam:    VS:  BP (!) 162/60 (BP Location: Left Arm, Patient Position: Sitting)   Pulse 72   Ht 5\' 9"  (1.753 m)   Wt 230 lb (104.3 kg)   SpO2 97%   BMI 33.97 kg/m     Wt Readings from Last 3 Encounters:  02/19/24 230 lb (104.3 kg)  01/15/24 235 lb 14.3 oz (107 kg)  01/11/24 237 lb (107.5 kg)     GEN:  Well nourished, well developed in no acute distress HEENT: Normal NECK: No JVD; No carotid bruits LYMPHATICS: No lymphadenopathy CARDIAC: RRR, no murmurs, no rubs, no gallops RESPIRATORY:  Clear to auscultation without rales, wheezing or rhonchi  ABDOMEN: Soft, non-tender, non-distended MUSCULOSKELETAL:  No edema; No deformity  SKIN: Warm and dry LOWER EXTREMITIES: 2+ swelling NEUROLOGIC:  Alert and oriented x 3 PSYCHIATRIC:  Normal affect   ASSESSMENT:    1. Paroxysmal atrial flutter (HCC)   2. Dyspnea on exertion   3. Essential hypertension   4. Dilated cardiomyopathy (HCC)   5. Bifascicular bundle branch block    PLAN:    In order of problems listed above:  Atrial flutter, anticoagulated, rate controlled we will anticipate cardioversion but will stabilize him first.  At least we have no difficulty with controlling his ventricular rate. Dyspnea exertion with swelling of lower extremities, echocardiogram will be done, will do Chem-7 today, if Chem-7 is fine we will be able to increase dose of diuretic.  If not we will need to control his blood pressure better may use hydralazine.  And cut down amlodipine. Bifascicular block.  Noted. Essential hypertension still elevated plan as described above wait for Chem-7 to decide which way to go   Medication Adjustments/Labs and Tests Ordered: Current medicines are reviewed at length with the patient today.  Concerns regarding medicines are outlined above.  Orders Placed This Encounter  Procedures    CBC   Basic metabolic panel   Pro b natriuretic peptide (BNP)   EKG 12-Lead   ECHOCARDIOGRAM COMPLETE   Medication changes: No orders of the defined types were placed in this encounter.   Signed, Marveen Reeks.  Bing Matter, MD, [facility] 02/19/2024 9:51 AM    [facility]

## 2024-02-21 ENCOUNTER — Telehealth: Payer: Self-pay | Admitting: Cardiology

## 2024-02-21 LAB — BASIC METABOLIC PANEL
BUN/Creatinine Ratio: 13 (ref 10–24)
BUN: 20 mg/dL (ref 10–36)
CO2: 24 mmol/L (ref 20–29)
Calcium: 9.3 mg/dL (ref 8.6–10.2)
Chloride: 103 mmol/L (ref 96–106)
Creatinine, Ser: 1.53 mg/dL — ABNORMAL HIGH (ref 0.76–1.27)
Glucose: 207 mg/dL — ABNORMAL HIGH (ref 70–99)
Potassium: 4.2 mmol/L (ref 3.5–5.2)
Sodium: 142 mmol/L (ref 134–144)
eGFR: 43 mL/min/{1.73_m2} — ABNORMAL LOW (ref >59)

## 2024-02-21 LAB — CBC
Hematocrit: 40.7 % (ref 37.5–51.0)
Hemoglobin: 13.7 g/dL (ref 13.0–17.7)
MCH: 33.5 pg — ABNORMAL HIGH (ref 26.6–33.0)
MCHC: 33.7 g/dL (ref 31.5–35.7)
MCV: 100 fL — ABNORMAL HIGH (ref 79–97)
Platelets: 205 10*3/uL (ref 150–450)
RBC: 4.09 x10E6/uL — ABNORMAL LOW (ref 4.14–5.80)
RDW: 12.2 % (ref 11.6–15.4)
WBC: 7.6 10*3/uL (ref 3.4–10.8)

## 2024-02-21 LAB — PRO B NATRIURETIC PEPTIDE: NT-Pro BNP: 514 pg/mL — ABNORMAL HIGH (ref 0–486)

## 2024-02-21 NOTE — Telephone Encounter (Signed)
 Pt c/o medication issue:  1. Name of Medication:   apixaban (ELIQUIS) 2.5 MG TABS tablet   2. How are you currently taking this medication (dosage and times per day)?   As prescribed  3. Are you having a reaction (difficulty breathing--STAT)?   No  4. What is your medication issue?    Patient stated he wants to postpone having his heart shocked until the week of April 14th.  Patient stated he had not been on the medication for 5 weeks yet as he was delayed on starting his medication.

## 2024-02-21 NOTE — Telephone Encounter (Signed)
Left vm to return our call  

## 2024-02-22 ENCOUNTER — Telehealth: Payer: Self-pay

## 2024-02-22 NOTE — Telephone Encounter (Signed)
 Spoke with pt regarding lab results. He will increase his lasix to 40mg  for 3 days. He will call when he is ready to schedule his cardioversion.

## 2024-02-22 NOTE — Telephone Encounter (Signed)
 Lab Results reviewed with pt as per Dr. Vanetta Shawl note.  Pt verbalized understanding and had no additional questions. Routed to PCP

## 2024-03-04 ENCOUNTER — Ambulatory Visit: Attending: Cardiology

## 2024-03-04 DIAGNOSIS — R0609 Other forms of dyspnea: Secondary | ICD-10-CM | POA: Diagnosis present

## 2024-03-04 LAB — ECHOCARDIOGRAM COMPLETE
P 1/2 time: 693 ms
S' Lateral: 3.5 cm

## 2024-03-13 ENCOUNTER — Telehealth: Payer: Self-pay

## 2024-03-13 NOTE — Telephone Encounter (Signed)
 Results reviewed with pt as per Dr. Vanetta Shawl note.  Pt verbalized understanding and had no additional questions. Routed to PCP

## 2024-03-13 NOTE — Telephone Encounter (Signed)
 Left message on My Chart with Echo results per Dr. Vanetta Shawl note. Routed to PCP.

## 2024-03-13 NOTE — Telephone Encounter (Signed)
 Spoke with pt regarding scheduling cardioversion. He stated that he was feeling good and checking his blood pressures daily. He stated that he would like to see Dr. Gordan Latina on April 29th before scheduling the cardioversion. He also wanted to know if he could take OTC allergy medications. Dr. Krasowski advised that he can take OTC allergy medications without decongestant. Pt verbalized understanding and had no further questions.

## 2024-03-21 ENCOUNTER — Telehealth: Payer: Self-pay

## 2024-03-21 DIAGNOSIS — R195 Other fecal abnormalities: Secondary | ICD-10-CM

## 2024-03-21 NOTE — Telephone Encounter (Signed)
 Order for stool for blood entered per Dr. Krasowski.

## 2024-03-22 LAB — FECAL OCCULT BLOOD, IMMUNOCHEMICAL: Fecal Occult Bld: POSITIVE — AB

## 2024-03-25 DIAGNOSIS — Z0289 Encounter for other administrative examinations: Secondary | ICD-10-CM

## 2024-03-25 HISTORY — DX: Encounter for other administrative examinations: Z02.89

## 2024-03-26 ENCOUNTER — Encounter: Payer: Self-pay | Admitting: Cardiology

## 2024-03-26 ENCOUNTER — Ambulatory Visit: Attending: Cardiology | Admitting: Cardiology

## 2024-03-26 ENCOUNTER — Telehealth: Payer: Self-pay

## 2024-03-26 VITALS — BP 140/60 | HR 59 | Ht 69.0 in | Wt 219.0 lb

## 2024-03-26 DIAGNOSIS — I10 Essential (primary) hypertension: Secondary | ICD-10-CM | POA: Diagnosis not present

## 2024-03-26 DIAGNOSIS — E782 Mixed hyperlipidemia: Secondary | ICD-10-CM | POA: Insufficient documentation

## 2024-03-26 DIAGNOSIS — I4892 Unspecified atrial flutter: Secondary | ICD-10-CM | POA: Insufficient documentation

## 2024-03-26 DIAGNOSIS — I420 Dilated cardiomyopathy: Secondary | ICD-10-CM | POA: Diagnosis not present

## 2024-03-26 DIAGNOSIS — I452 Bifascicular block: Secondary | ICD-10-CM | POA: Insufficient documentation

## 2024-03-26 NOTE — H&P (View-Only) (Signed)
 Cardiology Office Note:    Date:  03/26/2024   ID:  Phillip Neal, DOB [birth year], MRN [id number]  PCP:  No primary care provider on file.  Cardiologist:  Ralene Burger, MD    Referring MD: No ref. provider found   Chief Complaint  Patient presents with   Blood Pressure Check    History of Present Illness:    Phillip Neal is a 89 y.o. male past medical history significant for essential hypertension, history of diminished ejection fraction with normalization on last 2 echocardiogram last 1 done in March 04, 2024,, history of trifascicular block, atypical chest pain with negative stress test a few weeks ago he called complaining of having significant swelling of lower extremities.  After that proBNP has been done which was elevated EKG showed atrial flutter with controlled ventricular rate, we try diuresing him for significant amount of weight, swelling is still present.  He maintained atrial flutter with controlled ventricular rate.  He is being anticoagulated Eliquis  with stable H&H, there was an issue of positive stool guaiac but he does have some hemorrhoids which is probably responsible for positivity of his blood in the stool.  He comes today to talk about the situation overall feels better but lower extremities are still swollen.  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 EF 50-55%. Christalynn [place].  Scar on ST 2019. PVC on monitor.   Chronic gout involving toe  of left foot without tophus 12/27/2023   Chronic rhinitis 11/07/2018   Chronic venous insufficiency 07/28/2022   CKD (chronic kidney disease), stage III (HCC) 01/11/2016   Class 1 obesity due to excess calories with serious comorbidity and body mass index (BMI) of 33.0 to 33.9 in adult 03/22/2016   Cough last week   no phlegm jill dr [place] Ina pa aware   Diabetes mellitus with stage 3 chronic kidney disease (HCC) 01/11/2016   Diabetes mellitus without complication (HCC)    Dyspnea on exertion 04/01/2020   Elevated prostate specific antigen (PSA) 03/22/2016   Formatting of this note might be different from the original. No longer following.   Essential hypertension 01/11/2016   GERD without esophagitis 05/01/2018   Gouty arthropathy 03/22/2016   Hematest positive stools 01/18/2023   History of iron deficiency 05/28/2018   Formatting of this note might be different from the original. After surgeries. Follow. No longer anemic.   History of kidney stones    years ago, passed on own   Hyperlipidemia    Hypertension    Mixed hyperlipidemia 01/11/2016   Myalgia due to statin 08/13/2021   OA (osteoarthritis) of hip 01/31/2018   Onychomycosis 02/14/2022   Paroxysmal atrial flutter (HCC) 01/11/2024   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   Sensorineural hearing loss, bilateral 02/03/2023   Shortness of breath 01/21/2021   Strain of knee 11/25/2019   Syncope and collapse 01/01/2021   Vertigo    jan  2019 surgery rescheduled ent dr Sulema [place] [place] no vertigo  since end of january   Vitamin B12 deficiency 01/11/2016   Weakness 12/27/2023    Past Surgical History:  Procedure Laterality Date   colonscopy     with polyp removal   HERNIA REPAIR  1954   left inguinal hernia   TOTAL HIP ARTHROPLASTY Left 01/31/2018   Procedure: LEFT TOTAL HIP ARTHROPLASTY ANTERIOR APPROACH;  Surgeon: Liliane Rei, MD;  Location: [facility];  Service: Orthopedics;  Laterality: Left;   TOTAL  HIP ARTHROPLASTY Right 05/16/2018   Procedure: RIGHT TOTAL HIP ARTHROPLASTY ANTERIOR APPROACH;  Surgeon: Liliane Rei, MD;  Location: [facility];  Service: Orthopedics;  Laterality: Right;    Current Medications: Current Meds  Medication Sig   Accu-Chek FastClix Lancets MISC 1 Lancet by Other route as directed.   ACCU-CHEK GUIDE TEST test strip 1 each by Other route daily.   allopurinol (ZYLOPRIM) 100 MG tablet Take 1 tablet by mouth daily.   amLODipine  (NORVASC ) 5 MG tablet Take 1 tablet (5 mg total) by mouth daily.   apixaban  (ELIQUIS ) 2.5 MG TABS tablet Take 1 tablet (2.5 mg total) by mouth 2 (two) times daily.   apixaban  (ELIQUIS ) 2.5 MG TABS tablet Take 1 tablet (2.5 mg total) by mouth 2 (two) times daily.   Cholecalciferol (VITAMIN D3) 25 MCG (1000 UT) CAPS Take 1 capsule by mouth daily.   clotrimazole-betamethasone (LOTRISONE) cream Apply 1 Application topically 2 (two) times daily.   Coenzyme Q10 (CO Q 10) 100 MG CAPS Take 100 mg by mouth 2 (two) times daily.   dicyclomine (BENTYL) 10 MG capsule Take 10 mg by mouth as needed for cramping.   ezetimibe  (ZETIA ) 10 MG tablet Take 10 mg by mouth daily.   fluconazole (DIFLUCAN) 200 MG tablet Take 200 mg by mouth once a week.   fluticasone  (FLONASE ) 50 MCG/ACT nasal spray Place 2 sprays into both nostrils daily as needed for allergies or rhinitis.   furosemide  (LASIX ) 20 MG tablet Take 1 tablet (20 mg total) by mouth daily.   lisinopril (PRINIVIL,ZESTRIL) 40 MG tablet Take 40 mg by mouth daily.    metFORMIN (GLUCOPHAGE-XR) 500 MG 24 hr tablet Take 1,000 mg by mouth 2 (two) times daily.    vitamin B-12 (CYANOCOBALAMIN) 1000 MCG tablet Take 1,000 mcg by mouth daily.     Allergies:   Sulfa antibiotics, Ciprofloxacin, Other, Statins, and Sulfamethoxazole   Social History   Socioeconomic History   Marital status: Widowed    Spouse name: Not on file   Number of children: Not on file   Years of education: Not on file   Highest education  level: Not on file  Occupational History   Not on file  Tobacco Use   Smoking status: Never   Smokeless tobacco: Never  Vaping Use   Vaping status: Never Used  Substance and Sexual Activity   Alcohol use: Never   Drug use: Never   Sexual activity: Not on file  Other Topics Concern   Not on file  Social History Narrative   Not on file   Social Drivers of Health   Financial Resource Strain: Not on file  Food Insecurity: Low Risk  (08/22/2023)   Received from [facility]   Hunger Vital Sign    Worried About Running Out of Food in the Last Year: Never true    Ran Out of Food in the Last Year: Never true  Transportation Needs: No Transportation Needs (08/22/2023)   Received from [employer]    In the  past 12 months, has lack of reliable transportation kept you from medical appointments, meetings, work or from getting things needed for daily living? : No  Physical Activity: Not on file  Stress: Not on file  Social Connections: Not on file     Family History: [name] family history includes Heart failure in his mother. ROS:   Please see the history of present illness.    All 14 point review of systems negative except as described per history of present illness  EKGs/Labs/Other Studies Reviewed:         Recent Labs: 01/15/2024: B Natriuretic Peptide 106.5 02/20/2024: BUN 20; Creatinine, Ser 1.53; Hemoglobin 13.7; NT-Pro BNP 514; Platelets 205; Potassium 4.2; Sodium 142  Recent Lipid Panel No results found for: "CHOL", "TRIG", "HDL", "CHOLHDL", "VLDL", "LDLCALC", "LDLDIRECT"  Physical Exam:    VS:  BP (!) 140/60 (BP Location: Right Arm, Patient Position: Sitting)   Pulse (!) 59   Ht 5\' 9"  (1.753 m)   Wt 219 lb (99.3 kg)   SpO2 95%   BMI 32.34 kg/m     Wt Readings from Last 3 Encounters:  03/26/24 219 lb (99.3 kg)  02/19/24 230 lb (104.3 kg)  01/15/24 235 lb 14.3 oz (107 kg)     GEN:  Well nourished, well developed in no acute  distress HEENT: Normal NECK: No JVD; No carotid bruits LYMPHATICS: No lymphadenopathy CARDIAC: RRR, no murmurs, no rubs, no gallops RESPIRATORY:  Clear to auscultation without rales, wheezing or rhonchi  ABDOMEN: Soft, non-tender, non-distended MUSCULOSKELETAL:  No edema; No deformity  SKIN: Warm and dry LOWER EXTREMITIES: 1+ swelling NEUROLOGIC:  Alert and oriented x 3 PSYCHIATRIC:  Normal affect   ASSESSMENT:    1. Essential hypertension   2. Paroxysmal atrial flutter (HCC)   3. Dilated cardiomyopathy (HCC)   4. Bifascicular bundle branch block   5. Mixed hyperlipidemia    PLAN:    In order of problems listed above:  Persistent atrial flutter, rate is controlled, he is being anticoagulated Eliquis  dose is appropriate to his age weight and kidney function are asking to continue he will be scheduled to have electrical cardioversion.  He prefers to have it done at [facility]. History of cardiomyopathy last echocardiogram showed preserved/normal ejection fraction, stress test was negative.  On appropriate medications which I will continue. Essential hypertension blood pressure is controlled right now. Cardioversion was explained to hemoglobin all risk benefits as well as alternative.  Will proceed    Medication Adjustments/Labs and Tests Ordered: Current medicines are reviewed at length with the patient today.  Concerns regarding medicines are outlined above.  Orders Placed This Encounter  Procedures   EKG 12-Lead   Medication changes: No orders of the defined types were placed in this encounter.   Signed, Manfred Seed, MD, [facility] 03/26/2024 12:03 PM    [facility]

## 2024-03-26 NOTE — Telephone Encounter (Signed)
 Spoke with pt regarding cardioversion and reviewed instructions and date and time of procedure. 04-04-24 9AM Case # [id number].Pt verbalized understanding and had no further questions.

## 2024-03-26 NOTE — Progress Notes (Signed)
 Cardiology Office Note:    Date:  03/26/2024   ID:  Phillip Neal, DOB [birth year], MRN [id number]  PCP:  No primary care provider on file.  Cardiologist:  Ralene Burger, MD    Referring MD: No ref. provider found   Chief Complaint  Patient presents with   Blood Pressure Check    History of Present Illness:    Phillip Neal is a 89 y.o. male past medical history significant for essential hypertension, history of diminished ejection fraction with normalization on last 2 echocardiogram last 1 done in March 04, 2024,, history of trifascicular block, atypical chest pain with negative stress test a few weeks ago he called complaining of having significant swelling of lower extremities.  After that proBNP has been done which was elevated EKG showed atrial flutter with controlled ventricular rate, we try diuresing him for significant amount of weight, swelling is still present.  He maintained atrial flutter with controlled ventricular rate.  He is being anticoagulated Eliquis  with stable H&H, there was an issue of positive stool guaiac but he does have some hemorrhoids which is probably responsible for positivity of his blood in the stool.  He comes today to talk about the situation overall feels better but lower extremities are still swollen.  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 EF 50-55%. Christalynn [place].  Scar on ST 2019. PVC on monitor.   Chronic gout involving toe  of left foot without tophus 12/27/2023   Chronic rhinitis 11/07/2018   Chronic venous insufficiency 07/28/2022   CKD (chronic kidney disease), stage III (HCC) 01/11/2016   Class 1 obesity due to excess calories with serious comorbidity and body mass index (BMI) of 33.0 to 33.9 in adult 03/22/2016   Cough last week   no phlegm jill dr [place] Ina pa aware   Diabetes mellitus with stage 3 chronic kidney disease (HCC) 01/11/2016   Diabetes mellitus without complication (HCC)    Dyspnea on exertion 04/01/2020   Elevated prostate specific antigen (PSA) 03/22/2016   Formatting of this note might be different from the original. No longer following.   Essential hypertension 01/11/2016   GERD without esophagitis 05/01/2018   Gouty arthropathy 03/22/2016   Hematest positive stools 01/18/2023   History of iron deficiency 05/28/2018   Formatting of this note might be different from the original. After surgeries. Follow. No longer anemic.   History of kidney stones    years ago, passed on own   Hyperlipidemia    Hypertension    Mixed hyperlipidemia 01/11/2016   Myalgia due to statin 08/13/2021   OA (osteoarthritis) of hip 01/31/2018   Onychomycosis 02/14/2022   Paroxysmal atrial flutter (HCC) 01/11/2024   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   Sensorineural hearing loss, bilateral 02/03/2023   Shortness of breath 01/21/2021   Strain of knee 11/25/2019   Syncope and collapse 01/01/2021   Vertigo    jan  2019 surgery rescheduled ent dr Sulema [place] [place] no vertigo  since end of january   Vitamin B12 deficiency 01/11/2016   Weakness 12/27/2023    Past Surgical History:  Procedure Laterality Date   colonscopy     with polyp removal   HERNIA REPAIR  1954   left inguinal hernia   TOTAL HIP ARTHROPLASTY Left 01/31/2018   Procedure: LEFT TOTAL HIP ARTHROPLASTY ANTERIOR APPROACH;  Surgeon: Liliane Rei, MD;  Location: [facility];  Service: Orthopedics;  Laterality: Left;   TOTAL  HIP ARTHROPLASTY Right 05/16/2018   Procedure: RIGHT TOTAL HIP ARTHROPLASTY ANTERIOR APPROACH;  Surgeon: Liliane Rei, MD;  Location: [facility];  Service: Orthopedics;  Laterality: Right;    Current Medications: Current Meds  Medication Sig   Accu-Chek FastClix Lancets MISC 1 Lancet by Other route as directed.   ACCU-CHEK GUIDE TEST test strip 1 each by Other route daily.   allopurinol (ZYLOPRIM) 100 MG tablet Take 1 tablet by mouth daily.   amLODipine  (NORVASC ) 5 MG tablet Take 1 tablet (5 mg total) by mouth daily.   apixaban  (ELIQUIS ) 2.5 MG TABS tablet Take 1 tablet (2.5 mg total) by mouth 2 (two) times daily.   apixaban  (ELIQUIS ) 2.5 MG TABS tablet Take 1 tablet (2.5 mg total) by mouth 2 (two) times daily.   Cholecalciferol (VITAMIN D3) 25 MCG (1000 UT) CAPS Take 1 capsule by mouth daily.   clotrimazole-betamethasone (LOTRISONE) cream Apply 1 Application topically 2 (two) times daily.   Coenzyme Q10 (CO Q 10) 100 MG CAPS Take 100 mg by mouth 2 (two) times daily.   dicyclomine (BENTYL) 10 MG capsule Take 10 mg by mouth as needed for cramping.   ezetimibe  (ZETIA ) 10 MG tablet Take 10 mg by mouth daily.   fluconazole (DIFLUCAN) 200 MG tablet Take 200 mg by mouth once a week.   fluticasone  (FLONASE ) 50 MCG/ACT nasal spray Place 2 sprays into both nostrils daily as needed for allergies or rhinitis.   furosemide  (LASIX ) 20 MG tablet Take 1 tablet (20 mg total) by mouth daily.   lisinopril (PRINIVIL,ZESTRIL) 40 MG tablet Take 40 mg by mouth daily.    metFORMIN (GLUCOPHAGE-XR) 500 MG 24 hr tablet Take 1,000 mg by mouth 2 (two) times daily.    vitamin B-12 (CYANOCOBALAMIN) 1000 MCG tablet Take 1,000 mcg by mouth daily.     Allergies:   Sulfa antibiotics, Ciprofloxacin, Other, Statins, and Sulfamethoxazole   Social History   Socioeconomic History   Marital status: Widowed    Spouse name: Not on file   Number of children: Not on file   Years of education: Not on file   Highest education  level: Not on file  Occupational History   Not on file  Tobacco Use   Smoking status: Never   Smokeless tobacco: Never  Vaping Use   Vaping status: Never Used  Substance and Sexual Activity   Alcohol use: Never   Drug use: Never   Sexual activity: Not on file  Other Topics Concern   Not on file  Social History Narrative   Not on file   Social Drivers of Health   Financial Resource Strain: Not on file  Food Insecurity: Low Risk  (08/22/2023)   Received from [facility]   Hunger Vital Sign    Worried About Running Out of Food in the Last Year: Never true    Ran Out of Food in the Last Year: Never true  Transportation Needs: No Transportation Needs (08/22/2023)   Received from [facility]    In the  past 12 months, has lack of reliable transportation kept you from medical appointments, meetings, work or from getting things needed for daily living? : No  Physical Activity: Not on file  Stress: Not on file  Social Connections: Not on file     Family History: [name] family history includes Heart failure in his mother. ROS:   Please see the history of present illness.    All 14 point review of systems negative except as described per history of present illness  EKGs/Labs/Other Studies Reviewed:         Recent Labs: 01/15/2024: B Natriuretic Peptide 106.5 02/20/2024: BUN 20; Creatinine, Ser 1.53; Hemoglobin 13.7; NT-Pro BNP 514; Platelets 205; Potassium 4.2; Sodium 142  Recent Lipid Panel No results found for: "CHOL", "TRIG", "HDL", "CHOLHDL", "VLDL", "LDLCALC", "LDLDIRECT"  Physical Exam:    VS:  BP (!) 140/60 (BP Location: Right Arm, Patient Position: Sitting)   Pulse (!) 59   Ht 5\' 9"  (1.753 m)   Wt 219 lb (99.3 kg)   SpO2 95%   BMI 32.34 kg/m     Wt Readings from Last 3 Encounters:  03/26/24 219 lb (99.3 kg)  02/19/24 230 lb (104.3 kg)  01/15/24 235 lb 14.3 oz (107 kg)     GEN:  Well nourished, well developed in no acute  distress HEENT: Normal NECK: No JVD; No carotid bruits LYMPHATICS: No lymphadenopathy CARDIAC: RRR, no murmurs, no rubs, no gallops RESPIRATORY:  Clear to auscultation without rales, wheezing or rhonchi  ABDOMEN: Soft, non-tender, non-distended MUSCULOSKELETAL:  No edema; No deformity  SKIN: Warm and dry LOWER EXTREMITIES: 1+ swelling NEUROLOGIC:  Alert and oriented x 3 PSYCHIATRIC:  Normal affect   ASSESSMENT:    1. Essential hypertension   2. Paroxysmal atrial flutter (HCC)   3. Dilated cardiomyopathy (HCC)   4. Bifascicular bundle branch block   5. Mixed hyperlipidemia    PLAN:    In order of problems listed above:  Persistent atrial flutter, rate is controlled, he is being anticoagulated Eliquis  dose is appropriate to his age weight and kidney function are asking to continue he will be scheduled to have electrical cardioversion.  He prefers to have it done at [facility]. History of cardiomyopathy last echocardiogram showed preserved/normal ejection fraction, stress test was negative.  On appropriate medications which I will continue. Essential hypertension blood pressure is controlled right now. Cardioversion was explained to hemoglobin all risk benefits as well as alternative.  Will proceed    Medication Adjustments/Labs and Tests Ordered: Current medicines are reviewed at length with the patient today.  Concerns regarding medicines are outlined above.  Orders Placed This Encounter  Procedures   EKG 12-Lead   Medication changes: No orders of the defined types were placed in this encounter.   Signed, Manfred Seed, MD, [facility] 03/26/2024 12:03 PM    [facility]

## 2024-03-26 NOTE — Patient Instructions (Signed)
 Medication Instructions:  Your physician recommends that you continue on your current medications as directed. Please refer to the Current Medication list given to you today.  *If you need a refill on your cardiac medications before your next appointment, please call your pharmacy*   Lab Work: None Ordered If you have labs (blood work) drawn today and your tests are completely normal, you will receive your results only by: MyChart Message (if you have MyChart) OR A paper copy in the mail If you have any lab test that is abnormal or we need to change your treatment, we will call you to review the results.   Testing/Procedures:     Dear Phillip Neal  You are scheduled for a Cardioversion on Thursday, May 8 with Dr. Emmette Harms.  Please arrive at the [facility] (Main Entrance A) at [facility]: [address] [place], [place] [address] at 7:30 AM (This time is 1.5 hour(s) before your procedure to ensure your preparation).   Free valet parking service is available. You will check in at ADMITTING.   *Please Note: You will receive a call the day before your procedure to confirm the appointment time. That time may have changed from the original time based on the schedule for that day.*    DIET:  Nothing to eat or drink after midnight except a sip of water  with medications (see medication instructions below)  Continue taking your anticoagulant (blood thinner): Apixaban  (Eliquis ).  You will need to continue this after your procedure until you are told by your provider that it is safe to stop.      FYI:  For your safety, and to allow us  to monitor your vital signs accurately during the surgery/procedure we request: If you have artificial nails, gel coating, SNS etc, please have those removed prior to your surgery/procedure. Not having the nail coverings /polish removed may result in cancellation or delay of your surgery/procedure.  Your support person will be asked to wait in the  waiting room during your procedure.  It is OK to have someone drop you off and come back when you are ready to be discharged.  You cannot drive after the procedure and will need someone to drive you home.  Bring your insurance cards.  *Special Note: Every effort is made to have your procedure done on time. Occasionally there are emergencies that occur at the hospital that may cause delays. Please be patient if a delay does occur.    Follow-Up: At [facility], you and your health needs are our priority.  As part of our continuing mission to provide you with exceptional heart care, we have created designated Provider Care Teams.  These Care Teams include your primary Cardiologist (physician) and Advanced Practice Providers (APPs -  Physician Assistants and Nurse Practitioners) who all work together to provide you with the care you need, when you need it.  We recommend signing up for the patient portal called "MyChart".  Sign up information is provided on this After Visit Summary.  MyChart is used to connect with patients for Virtual Visits (Telemedicine).  Patients are able to view lab/test results, encounter notes, upcoming appointments, etc.  Non-urgent messages can be sent to your provider as well.   To learn more about what you can do with MyChart, go to [email or web address].    Your next appointment:   1 month(s)  The format for your next appointment:   In Person  Provider:   Ralene Burger, MD    Other  Instructions NA

## 2024-03-27 LAB — BASIC METABOLIC PANEL WITH GFR
BUN/Creatinine Ratio: 21 (ref 10–24)
BUN: 30 mg/dL (ref 10–36)
CO2: 23 mmol/L (ref 20–29)
Calcium: 10 mg/dL (ref 8.6–10.2)
Chloride: 104 mmol/L (ref 96–106)
Creatinine, Ser: 1.46 mg/dL — ABNORMAL HIGH (ref 0.76–1.27)
Glucose: 223 mg/dL — ABNORMAL HIGH (ref 70–99)
Potassium: 4.5 mmol/L (ref 3.5–5.2)
Sodium: 145 mmol/L — ABNORMAL HIGH (ref 134–144)
eGFR: 45 mL/min/{1.73_m2} — ABNORMAL LOW (ref >59)

## 2024-03-27 LAB — CBC
Hematocrit: 44.7 % (ref 37.5–51.0)
Hemoglobin: 14.4 g/dL (ref 13.0–17.7)
MCH: 32.7 pg (ref 26.6–33.0)
MCHC: 32.2 g/dL (ref 31.5–35.7)
MCV: 102 fL — ABNORMAL HIGH (ref 79–97)
Platelets: 230 10*3/uL (ref 150–450)
RBC: 4.4 x10E6/uL (ref 4.14–5.80)
RDW: 12.7 % (ref 11.6–15.4)
WBC: 8.5 10*3/uL (ref 3.4–10.8)

## 2024-04-04 ENCOUNTER — Encounter (HOSPITAL_COMMUNITY)
Admission: RE | Disposition: A | Discharge status: Home / Self Care | Payer: Self-pay | Source: Home / Self Care | Attending: Cardiology

## 2024-04-04 ENCOUNTER — Encounter (HOSPITAL_COMMUNITY): Payer: Self-pay | Admitting: Cardiology

## 2024-04-04 ENCOUNTER — Other Ambulatory Visit: Payer: Self-pay

## 2024-04-04 ENCOUNTER — Ambulatory Visit (HOSPITAL_BASED_OUTPATIENT_CLINIC_OR_DEPARTMENT_OTHER)

## 2024-04-04 ENCOUNTER — Ambulatory Visit (HOSPITAL_COMMUNITY)

## 2024-04-04 ENCOUNTER — Ambulatory Visit (HOSPITAL_COMMUNITY)
Admission: RE | Admit: 2024-04-04 | Discharge: 2024-04-04 | Disposition: A | Discharge status: Home / Self Care | Attending: Cardiology | Admitting: Cardiology

## 2024-04-04 DIAGNOSIS — E1122 Type 2 diabetes mellitus with diabetic chronic kidney disease: Secondary | ICD-10-CM | POA: Insufficient documentation

## 2024-04-04 DIAGNOSIS — K219 Gastro-esophageal reflux disease without esophagitis: Secondary | ICD-10-CM | POA: Insufficient documentation

## 2024-04-04 DIAGNOSIS — I420 Dilated cardiomyopathy: Secondary | ICD-10-CM | POA: Insufficient documentation

## 2024-04-04 DIAGNOSIS — N183 Chronic kidney disease, stage 3 unspecified: Secondary | ICD-10-CM | POA: Insufficient documentation

## 2024-04-04 DIAGNOSIS — I4892 Unspecified atrial flutter: Secondary | ICD-10-CM | POA: Diagnosis not present

## 2024-04-04 DIAGNOSIS — E785 Hyperlipidemia, unspecified: Secondary | ICD-10-CM | POA: Diagnosis not present

## 2024-04-04 DIAGNOSIS — Z7901 Long term (current) use of anticoagulants: Secondary | ICD-10-CM | POA: Insufficient documentation

## 2024-04-04 DIAGNOSIS — I129 Hypertensive chronic kidney disease with stage 1 through stage 4 chronic kidney disease, or unspecified chronic kidney disease: Secondary | ICD-10-CM | POA: Insufficient documentation

## 2024-04-04 DIAGNOSIS — E782 Mixed hyperlipidemia: Secondary | ICD-10-CM | POA: Diagnosis not present

## 2024-04-04 DIAGNOSIS — I4891 Unspecified atrial fibrillation: Secondary | ICD-10-CM

## 2024-04-04 DIAGNOSIS — M1990 Unspecified osteoarthritis, unspecified site: Secondary | ICD-10-CM | POA: Insufficient documentation

## 2024-04-04 DIAGNOSIS — I452 Bifascicular block: Secondary | ICD-10-CM | POA: Diagnosis not present

## 2024-04-04 HISTORY — PX: CARDIOVERSION: EP1203

## 2024-04-04 LAB — GLUCOSE, CAPILLARY: Glucose-Capillary: 136 mg/dL — ABNORMAL HIGH (ref 70–99)

## 2024-04-04 SURGERY — CARDIOVERSION (CATH LAB)
Anesthesia: General

## 2024-04-04 MED ORDER — PROPOFOL 10 MG/ML IV BOLUS
INTRAVENOUS | Status: DC | PRN
  Administered 2024-04-04: 20 mg via INTRAVENOUS
  Administered 2024-04-04: 50 mg via INTRAVENOUS

## 2024-04-04 MED ORDER — SODIUM CHLORIDE 0.9% FLUSH: 3.0000 mL | Freq: Two times a day (BID) | INTRAVENOUS | Status: DC

## 2024-04-04 MED ORDER — SODIUM CHLORIDE 0.9% FLUSH: 3.0000 mL | INTRAVENOUS | Status: DC | PRN

## 2024-04-04 SURGICAL SUPPLY — 1 items: PAD DEFIB RADIO PHYSIO CONN (PAD) ×1 IMPLANT

## 2024-04-04 NOTE — Anesthesia Preprocedure Evaluation (Addendum)
 Anesthesia Evaluation  Patient identified by MRN, date of birth, ID band  Reviewed: Allergy & Precautions, NPO status , Patient's Chart, lab work & pertinent test results  Airway Mallampati: II  TM Distance: >3 FB Neck ROM: Full    Dental  (+) Dental Advisory Given, Missing   Pulmonary shortness of breath   Pulmonary exam normal breath sounds clear to auscultation       Cardiovascular hypertension, Pt. on medications + dysrhythmias Atrial Fibrillation + Valvular Problems/Murmurs MR and AI  Rhythm:Irregular Rate:Bradycardia  Echo 02/2024  1. Left ventricular ejection fraction, by estimation, is 60 to 65%. The left ventricle has normal function. The left ventricle has no regional wall motion abnormalities. There is mild left ventricular hypertrophy. Left ventricular diastolic parameters are indeterminate. The average left ventricular global longitudinal strain is -22.2 %. The global longitudinal strain is normal.   2. Right ventricular systolic function is normal. The right ventricular size is normal. There is normal pulmonary artery systolic pressure.   3. The mitral valve is normal in structure. Mild mitral valve regurgitation. No evidence of mitral stenosis.   4. The aortic valve is normal in structure. Aortic valve regurgitation is mild to moderate. No aortic stenosis is present.   5. The inferior vena cava is normal in size with greater than 50% respiratory variability, suggesting right atrial pressure of 3 mmHg.      Left ventricle: The cavity size was normal. Wall thickness was   normal. Systolic function was low normal to mildly reduced. The   estimated ejection fraction was in the range of 50% to 55%. Wall   motion was normal; there were no regional wall motion   abnormalities. Doppler parameters are consistent with abnormal   left ventricular relaxation (grade 1 diastolic dysfunction). - Aortic valve: There was no stenosis.  There was trivial   regurgitation. - Aorta: Mildly dilated ascending aorta. Ascending aortic diameter:   37 mm (S). - Mitral valve: Mildly calcified annulus. There was trivial   regurgitation. - Right ventricle: The cavity size was normal. Systolic function   was normal. - Pulmonary arteries: No complete TR doppler jet so unable to   estimate PA systolic pressure. - Inferior vena cava: The vessel was normal in size. The   respirophasic diameter changes were in the normal range (>= 50%),   consistent with normal central venous pressure.  Impressions:  - Normal LV size with low normal to mildly decreased systolic   function, EF 50-55%. Normal RV size and systolic function. No   significant valvular abnormalities.   Neuro/Psych negative neurological ROS  negative psych ROS   GI/Hepatic Neg liver ROS,GERD  Controlled,,  Endo/Other  diabetes  obesity  Renal/GU Renal InsufficiencyRenal disease     Musculoskeletal  (+) Arthritis ,    Abdominal  (+) + obese  Peds  Hematology  (+) Blood dyscrasia, anemia   Anesthesia Other Findings   Reproductive/Obstetrics                             Anesthesia Physical Anesthesia Plan  ASA: 3  Anesthesia Plan: General   Post-op Pain Management: Minimal or no pain anticipated   Induction: Intravenous  PONV Risk Score and Plan: 2 and Propofol  infusion, TIVA and Treatment may vary due to age or medical condition  Airway Management Planned: Natural Airway  Additional Equipment:   Intra-op Plan:   Post-operative Plan:   Informed Consent: I have reviewed the  patients History and Physical, chart, labs and discussed the procedure including the risks, benefits and alternatives for the proposed anesthesia with the patient or authorized representative who has indicated his/her understanding and acceptance.     Dental advisory given  Plan Discussed with: CRNA  Anesthesia Plan Comments:          Anesthesia Quick Evaluation

## 2024-04-04 NOTE — Interval H&P Note (Signed)
 History and Physical Interval Note:  04/04/2024 7:49 AM  Phillip Neal  has presented today for surgery, with the diagnosis of AFLUTTER.  The various methods of treatment have been discussed with the patient and family. After consideration of risks, benefits and other options for treatment, the patient has consented to  Procedure(s): CARDIOVERSION (N/A) as a surgical intervention.  [name] history has been reviewed, patient examined, no change in status, stable for surgery.  I have reviewed the patient's chart and labs.  Questions were answered to the patient's satisfaction.     Bentzion Dauria

## 2024-04-04 NOTE — Transfer of Care (Signed)
 Immediate Anesthesia Transfer of Care Note  Patient: Phillip Neal  Procedure(s) Performed: CARDIOVERSION  Patient Location: Cath Lab  Anesthesia Type:MAC  Level of Consciousness: awake, alert , oriented, and patient cooperative  Airway & Oxygen Therapy: Patient Spontanous Breathing and Patient connected to nasal cannula oxygen  Post-op Assessment: Report given to RN and Post -op Vital signs reviewed and stable  Post vital signs: Reviewed and stable  Last Vitals:  Vitals Value Taken Time  BP    Temp    Pulse    Resp    SpO2      Last Pain:  Vitals:   04/04/24 0747  TempSrc:   PainSc: 0-No pain         Complications: No notable events documented.

## 2024-04-04 NOTE — CV Procedure (Signed)
   Electrical Cardioversion Procedure Note Phillip Neal [id number] 06-03-1931  Procedure: Electrical Cardioversion Indications:  Atrial Flutter  Time Out: Verified patient identification, verified procedure,medications/allergies/relevent history reviewed, required imaging and test results available.  Performed  Procedure Details  The patient signed informed consent.   The patient was NPO past midnight. Has had therapeutic anticoagulation with Eliquis   greater than 3 weeks. The patient denies any interruption of anticoagulation.  Anesthesia was administered by Dr. Joann Mu.  Adequate airway was maintained throughout and vital followed per protocol.  He was cardioverted x 1 with 200 J of biphasic synchronized energy.  He converted to NSR.  There were no apparent complications.  The patient tolerated the procedure well and had normal neuro status and respiratory status post procedure with vitals stable as recorded elsewhere.     IMPRESSION:  Successful cardioversion of atrial fibrillation   Follow up:  We will arrange follow up with Dr Krasowski.  He will continue on current medical therapy.  The patient advised to continue anticoagulation.  Phillip Neal 04/04/2024, 9:06 AM

## 2024-04-04 NOTE — Anesthesia Procedure Notes (Signed)
 Procedure Name: MAC Date/Time: 04/04/2024 8:51 AM  Performed by: Ezzie Holstein, CRNAPre-anesthesia Checklist: Patient identified, Emergency Drugs available, Suction available, Patient being monitored and Timeout performed Patient Re-evaluated:Patient Re-evaluated prior to induction Oxygen Delivery Method: Nasal cannula Preoxygenation: Pre-oxygenation with 100% oxygen Induction Type: IV induction Placement Confirmation: positive ETCO2

## 2024-04-04 NOTE — Discharge Instructions (Signed)

## 2024-04-04 NOTE — Anesthesia Postprocedure Evaluation (Signed)
 Anesthesia Post Note  Patient: Phillip Neal  Procedure(s) Performed: CARDIOVERSION     Patient location during evaluation: PACU Anesthesia Type: General Level of consciousness: sedated and patient cooperative Pain management: pain level controlled Vital Signs Assessment: post-procedure vital signs reviewed and stable Respiratory status: spontaneous breathing Cardiovascular status: stable Anesthetic complications: no   No notable events documented.  Last Vitals:  Vitals:   04/04/24 0914 04/04/24 0924  BP: 124/60 136/64  Pulse: 60 (!) 58  Resp: 13 12  Temp:    SpO2: 98% 99%    Last Pain:  Vitals:   04/04/24 0924  TempSrc:   PainSc: 0-No pain                 Gorman Laughter

## 2024-04-05 ENCOUNTER — Encounter (HOSPITAL_COMMUNITY): Payer: Self-pay | Admitting: Cardiology

## 2024-04-10 ENCOUNTER — Ambulatory Visit: Payer: Self-pay

## 2024-04-24 ENCOUNTER — Other Ambulatory Visit: Payer: Self-pay

## 2024-04-25 ENCOUNTER — Encounter: Payer: Self-pay | Admitting: Cardiology

## 2024-04-25 ENCOUNTER — Ambulatory Visit: Attending: Cardiology | Admitting: Cardiology

## 2024-04-25 ENCOUNTER — Ambulatory Visit: Attending: Cardiology

## 2024-04-25 VITALS — BP 120/60 | HR 69 | Ht 69.0 in | Wt 213.0 lb

## 2024-04-25 DIAGNOSIS — I4892 Unspecified atrial flutter: Secondary | ICD-10-CM | POA: Insufficient documentation

## 2024-04-25 DIAGNOSIS — N183 Chronic kidney disease, stage 3 unspecified: Secondary | ICD-10-CM | POA: Insufficient documentation

## 2024-04-25 DIAGNOSIS — I452 Bifascicular block: Secondary | ICD-10-CM | POA: Diagnosis present

## 2024-04-25 DIAGNOSIS — I10 Essential (primary) hypertension: Secondary | ICD-10-CM | POA: Diagnosis present

## 2024-04-25 DIAGNOSIS — I420 Dilated cardiomyopathy: Secondary | ICD-10-CM | POA: Diagnosis present

## 2024-04-25 DIAGNOSIS — E1122 Type 2 diabetes mellitus with diabetic chronic kidney disease: Secondary | ICD-10-CM | POA: Insufficient documentation

## 2024-04-25 NOTE — Patient Instructions (Signed)
 Medication Instructions:  Your physician recommends that you continue on your current medications as directed. Please refer to the Current Medication list given to you today.  *If you need a refill on your cardiac medications before your next appointment, please call your pharmacy*  Lab Work: None If you have labs (blood work) drawn today and your tests are completely normal, you will receive your results only by: MyChart Message (if you have MyChart) OR A paper copy in the mail If you have any lab test that is abnormal or we need to change your treatment, we will call you to review the results.  Testing/Procedures: A zio monitor was ordered today. It will remain on for 14 days. You will then return monitor and event diary in provided box. It takes 1-2 weeks for report to be downloaded and returned to us . We will call you with the results. If monitor falls off or has orange flashing light, please call Zio for further instructions.   Follow-Up: At [facility], you and your health needs are our priority.  As part of our continuing mission to provide you with exceptional heart care, our providers are all part of one team.  This team includes your primary Cardiologist (physician) and Advanced Practice Providers or APPs (Physician Assistants and Nurse Practitioners) who all work together to provide you with the care you need, when you need it.  Your next appointment:   3 month(s)  Provider:   Ralene Burger, MD    We recommend signing up for the patient portal called "MyChart".  Sign up information is provided on this After Visit Summary.  MyChart is used to connect with patients for Virtual Visits (Telemedicine).  Patients are able to view lab/test results, encounter notes, upcoming appointments, etc.  Non-urgent messages can be sent to your provider as well.   To learn more about what you can do with MyChart, go to [email or web address].   Other Instructions None

## 2024-04-25 NOTE — Progress Notes (Unsigned)
 Cardiology Office Note:    Date:  04/25/2024   ID:  Phillip Neal, DOB Aug 15, 1931, MRN [id number]  PCP:  Abbe Hoard., MD  Cardiologist:  Ralene Burger, MD    Referring MD: No ref. provider found   Chief Complaint  Patient presents with   Follow-up    History of Present Illness:    Phillip Neal is a 89 y.o. male past medical history significant for essential hypertension history of diminished ejection fraction with normalization, trifascicular block, atypical chest pain with negative stress test few weeks ago recently also he was found to be in atrial flutter with controlled ventricular rate, cardioversion has been done since that time he is in sinus rhythm with quite long PR interval.  Comes today for follow-up he said he is doing better but still tired and exhausted.  He is complaining of prices of Eliquis .  Does have also swelling of lower extremities which is only mild.  Takes Lasix .  Past Medical History:  Diagnosis Date   Aftercare following joint replacement surgery 03/06/2018   Anemia of unknown etiology 08/15/2021   Asbestos exposure 04/01/2020   At high risk for injury related to fall 12/27/2023   Atopic dermatitis 01/08/2020   Atypical chest pain 09/27/2017   Benign prostatic hyperplasia 03/22/2016   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/11/2016   Bilateral hearing loss 08/04/2023   Cancer (HCC)    squamous cell removed 3 yrs ago from upper chest by neck   Cardiomyopathy (HCC) 01/08/2020   Formatting of this note might be different from the original. EF 50-55%. Phillip Neal.  Scar on ST 2019. PVC on monitor.   Chronic gout involving toe of left foot without tophus 12/27/2023   Chronic rhinitis 11/07/2018   Chronic venous insufficiency 07/28/2022   CKD (chronic kidney disease), stage III (HCC) 01/11/2016   Class 1 obesity due to excess calories with serious comorbidity and body mass index (BMI) of 33.0 to 33.9 in adult 03/22/2016   Cough last week    no phlegm jill dr [place] Ina pa aware   Diabetes mellitus with stage 3 chronic kidney disease (HCC) 01/11/2016   Diabetes mellitus without complication (HCC)    Dyspnea on exertion 04/01/2020   Elevated prostate specific antigen (PSA) 03/22/2016   Formatting of this note might be different from the original. No longer following.   Encounter for other administrative examinations 03/25/2024   Essential hypertension 01/11/2016   GERD without esophagitis 05/01/2018   Gouty arthropathy 03/22/2016   Hematest positive stools 01/18/2023   History of iron deficiency 05/28/2018   Formatting of this note might be different from the original. After surgeries. Follow. No longer anemic.   History of kidney stones    years ago, passed on own   Hyperlipidemia    Hypertension    Mixed hyperlipidemia 01/11/2016   Myalgia due to statin 08/13/2021   OA (osteoarthritis) of hip 01/31/2018   Onychomycosis 02/14/2022   Paroxysmal atrial flutter (HCC) 01/11/2024   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   Sensorineural hearing loss, bilateral 02/03/2023   Shortness of breath 01/21/2021   Strain of knee 11/25/2019   Syncope and collapse 01/01/2021   Vertigo    jan  2019 surgery rescheduled ent dr [facility] [place] no vertigo since end of january   Vitamin B12 deficiency 01/11/2016   Weakness 12/27/2023    Past Surgical History:  Procedure Laterality Date   CARDIOVERSION N/A 04/04/2024   Procedure: CARDIOVERSION;  Surgeon: Jerryl Morin, DO;  Location:  [facility];  Service: Cardiovascular;  Laterality: N/A;   colonscopy     with polyp removal   HERNIA REPAIR  1954   left inguinal hernia   TOTAL HIP ARTHROPLASTY Left 01/31/2018   Procedure: LEFT TOTAL HIP ARTHROPLASTY ANTERIOR APPROACH;  Surgeon: Liliane Rei, MD;  Location: [facility];  Service: Orthopedics;  Laterality: Left;   TOTAL HIP ARTHROPLASTY Right 05/16/2018   Procedure: RIGHT TOTAL HIP ARTHROPLASTY ANTERIOR APPROACH;   Surgeon: Liliane Rei, MD;  Location: [facility] OR[facility];  Service: Orthopedics;  Laterality: Right;    Current Medications: Current Meds  Medication Sig   allopurinol (ZYLOPRIM) 100 MG tablet Take 1 tablet by mouth daily.   amLODipine  (NORVASC ) 5 MG tablet Take 1 tablet (5 mg total) by mouth daily.   apixaban  (ELIQUIS ) 2.5 MG TABS tablet Take 1 tablet (2.5 mg total) by mouth 2 (two) times daily.   Cholecalciferol (VITAMIN D3) 25 MCG (1000 UT) CAPS Take 1 capsule by mouth daily.   clotrimazole-betamethasone (LOTRISONE) cream Apply 1 Application topically 2 (two) times daily.   Coenzyme Q10 (CO Q 10) 100 MG CAPS Take 100 mg by mouth 2 (two) times daily.   dicyclomine (BENTYL) 10 MG capsule Take 10 mg by mouth as needed for cramping.   ezetimibe  (ZETIA ) 10 MG tablet Take 10 mg by mouth daily.   fluconazole (DIFLUCAN) 200 MG tablet Take 200 mg by mouth once a week.   fluticasone  (FLONASE ) 50 MCG/ACT nasal spray Place 2 sprays into both nostrils daily as needed for allergies or rhinitis.   furosemide  (LASIX ) 20 MG tablet Take 1 tablet (20 mg total) by mouth daily.   lisinopril (PRINIVIL,ZESTRIL) 40 MG tablet Take 40 mg by mouth daily.    metFORMIN (GLUCOPHAGE-XR) 500 MG 24 hr tablet Take 1,000 mg by mouth 2 (two) times daily.    vitamin B-12 (CYANOCOBALAMIN) 1000 MCG tablet Take 1,000 mcg by mouth daily.     Allergies:   Sulfa antibiotics, Ciprofloxacin, Other, Statins, and Sulfamethoxazole   Social History   Socioeconomic History   Marital status: Widowed    Spouse name: Not on file   Number of children: 3   Years of education: Not on file   Highest education level: Not on file  Occupational History   Not on file  Tobacco Use   Smoking status: Never   Smokeless tobacco: Never  Vaping Use   Vaping status: Never Used  Substance and Sexual Activity   Alcohol use: Never   Drug use: Never   Sexual activity: Not on file  Other Topics Concern   Not on file  Social History Narrative   Not on  file   Social Drivers of Health   Financial Resource Strain: Not on file  Food Insecurity: Low Risk  (08/22/2023)   Received from [facility]   Hunger Vital Sign    Worried About Running Out of Food in the Last Year: Never true    Ran Out of Food in the Last Year: Never true  Transportation Needs: No Transportation Needs (08/22/2023)   Received from [facility]    In the past 12 months, has lack of reliable transportation kept you from medical appointments, meetings, work or from getting things needed for daily living? : No  Physical Activity: Not on file  Stress: Not on file  Social Connections: Not on file     Family History: [name] family history includes Heart failure in his mother. ROS:   Please  see the history of present illness.    All 14 point review of systems negative except as described per history of present illness  EKGs/Labs/Other Studies Reviewed:    EKG Interpretation Date/Time:  Thursday Apr 25 2024 10:14:59 EDT Ventricular Rate:  69 PR Interval:  416 QRS Duration:  138 QT Interval:  426 QTC Calculation: 456 R Axis:   -56  Text Interpretation: Sinus rhythm with 1st degree A-V block Right bundle branch block Left anterior fascicular block Bifascicular block Left ventricular hypertrophy Abnormal ECG When compared with ECG of 26-Mar-2024 11:50, Sinus rhythm has replaced Atrial flutter T wave inversion now evident in Lateral leads Confirmed by Ralene Burger [phone]) on 04/25/2024 10:21:28 AM    Recent Labs: 01/15/2024: B Natriuretic Peptide 106.5 02/20/2024: NT-Pro BNP 514 03/26/2024: BUN 30; Creatinine, Ser 1.46; Hemoglobin 14.4; Platelets 230; Potassium 4.5; Sodium 145  Recent Lipid Panel No results found for: "CHOL", "TRIG", "HDL", "CHOLHDL", "VLDL", "LDLCALC", "LDLDIRECT"  Physical Exam:    VS:  BP 120/60   Pulse 69   Ht 5\' 9"  (1.753 m)   Wt 213 lb (96.6 kg)   SpO2 98%   BMI 31.45 kg/m     Wt Readings from Last 3  Encounters:  04/25/24 213 lb (96.6 kg)  04/04/24 213 lb (96.6 kg)  03/26/24 219 lb (99.3 kg)     GEN:  Well nourished, well developed in no acute distress HEENT: Normal NECK: No JVD; No carotid bruits LYMPHATICS: No lymphadenopathy CARDIAC: RRR, no murmurs, no rubs, no gallops RESPIRATORY:  Clear to auscultation without rales, wheezing or rhonchi  ABDOMEN: Soft, non-tender, non-distended MUSCULOSKELETAL:  No edema; No deformity  SKIN: Warm and dry LOWER EXTREMITIES: no swelling NEUROLOGIC:  Alert and oriented x 3 PSYCHIATRIC:  Normal affect   ASSESSMENT:    1. Paroxysmal atrial flutter (HCC)   2. Bifascicular bundle branch block   3. Dilated cardiomyopathy (HCC)   4. Essential hypertension   5. Diabetes mellitus with stage 3 chronic kidney disease (HCC)    PLAN:    In order of problems listed above:  Paroxysmal atrial flutter sinus rhythm today.  Continue anticoagulation.  He is scheduled to see our EP team for consideration of more advanced way to manage his arrhythmia.  Also concerns trifascicular block.  I will put Zio patch on him for 2 weeks to see if he get any more advanced conduction problem investigates we may consider pacemaker. History of dilated cardiomyopathy with normalization last echocardiogram reviewed.  Continue present management. Essential hypertension blood pressure well-controlled. Diabetes stable   Medication Adjustments/Labs and Tests Ordered: Current medicines are reviewed at length with the patient today.  Concerns regarding medicines are outlined above.  Orders Placed This Encounter  Procedures   EKG 12-Lead   Medication changes: No orders of the defined types were placed in this encounter.   Signed, Manfred Seed, MD, [facility] 04/25/2024 10:39 AM    [facility]

## 2024-04-28 NOTE — Progress Notes (Unsigned)
 This encounter was created in error - please disregard.

## 2024-04-29 ENCOUNTER — Ambulatory Visit: Payer: Medicare Other | Admitting: Cardiology

## 2024-05-10 DIAGNOSIS — M170 Bilateral primary osteoarthritis of knee: Secondary | ICD-10-CM | POA: Insufficient documentation

## 2024-05-13 ENCOUNTER — Telehealth: Payer: Self-pay | Admitting: Cardiology

## 2024-05-13 NOTE — Telephone Encounter (Signed)
 Advised to call PCP to discuss. Pt verbalized understanding and had no additional questions.

## 2024-05-13 NOTE — Telephone Encounter (Signed)
 Pt c/o medication issue:  1. Name of Medication: Metformin  2. How are you currently taking this medication (dosage and times per day)?   3. Are you having a reaction (difficulty breathing--STAT)?   4. What is your medication issue? Patient wants to know if he needs to increase his Metformin- patient says his levels are running 241 today and 218 on Saturday  The injections he had in his hip on Friday, he was told it would run his numbers up.

## 2024-05-28 DIAGNOSIS — I4892 Unspecified atrial flutter: Secondary | ICD-10-CM

## 2024-05-30 ENCOUNTER — Ambulatory Visit: Payer: Self-pay | Admitting: Cardiology

## 2024-06-04 ENCOUNTER — Telehealth: Payer: Self-pay

## 2024-06-04 NOTE — Telephone Encounter (Signed)
 Pt called complaining of dizziness after sitting outside a few days ago. He stated that he fell over the arm of the chair but did not pass out, just dizziness. Last BP 115/78. He reported vertigo in years past and wondered if he should try medication for the dizziness. He also wanted to know how much water  he should be drinking daily. Spoke with Dr. Krasowski, he recommended that he drink plenty of fluids and use his walker for assistance and follow up with Dr. Inocencio as scheduled. Pt verbalized understanding and had no further questions.

## 2024-06-23 NOTE — Progress Notes (Unsigned)
  Electrophysiology Office Note:   Date:  06/24/2024  ID:  Phillip Neal, DOB 1931-06-13, MRN [id number]  Primary Cardiologist: Lamar Fitch, MD Primary Heart Failure: None Electrophysiologist: Kanya Potteiger Gladis Norton, MD      History of Present Illness:   Phillip Neal is a 89 y.o. male with h/o atrial flutter, trifascicular block, hypertension, hyperlipidemia seen today for routine electrophysiology followup.   Since last being seen in our clinic the patient reports doing overall well.  He was in atrial flutter and had a recent cardioversion.  He has had no further episodes of atrial flutter since that time.  His main complaint is dizziness.  He is dizzy at all times.  Is not necessarily related to changing position.  He gets more dizzy when he moves his head.  he denies chest pain, palpitations, dyspnea, PND, orthopnea, nausea, vomiting, syncope, edema, weight gain, or early satiety.   Review of systems complete and found to be negative unless listed in HPI.   EP Information / Studies Reviewed:    EKG is ordered today. Personal review as below.  EKG Interpretation Date/Time:  Monday June 24 2024 10:39:47 EDT Ventricular Rate:  91 PR Interval:    QRS Duration:  116 QT Interval:  366 QTC Calculation: 450 R Axis:   -63  Text Interpretation: Junctional rhythm First degree A-V block Left anterior fasicular block Right bundle branch block Abnormal ECG When compared with ECG of 25-Apr-2024 10:14, Junctional rhythm Confirmed by Taaj Hurlbut ([id number]) on 06/24/2024 10:47:37 AM   Risk Assessment/Calculations:    CHA2DS2-VASc Score = 5   This indicates a 7.2% annual risk of stroke. [name] score is based upon: CHF History: 1 HTN History: 1 Diabetes History: 1 Stroke History: 0 Vascular Disease History: 0 Age Score: 2 Gender Score: 0           Physical Exam:   VS:  BP (!) 112/58   Pulse 68   Ht 5' 9.5 (1.765 m)   Wt 211 lb 6.4 oz (95.9 kg)   SpO2 98%   BMI  30.77 kg/m    Wt Readings from Last 3 Encounters:  06/24/24 211 lb 6.4 oz (95.9 kg)  04/25/24 213 lb (96.6 kg)  04/04/24 213 lb (96.6 kg)     GEN: Well nourished, well developed in no acute distress NECK: No JVD; No carotid bruits CARDIAC: Regular rate and rhythm, no murmurs, rubs, gallops RESPIRATORY:  Clear to auscultation without rales, wheezing or rhonchi  ABDOMEN: Soft, non-tender, non-distended EXTREMITIES:  No edema; No deformity   ASSESSMENT AND PLAN:    1.  Syncope: Has trifascicular block.  Metoprolol  was stopped.  He continues to have significant conduction system disease.  Despite this, his heart rate is not bradycardic and his blood pressure is well-controlled.  For now, he would prefer watchful waiting.  If he does have any further arrhythmias, or evidence of worsening heart block, pacemaker implant would be beneficial.  2.  Hypertension: Well-controlled  3.  Typical atrial flutter: In sinus rhythm today.  No antiarrhythmics due to conduction system disease.  4.  Secondary hypercoagulable state: On Eliquis   Follow up with Dr. Norton in 6 months  Signed, Serge Main Gladis Norton, MD

## 2024-06-24 ENCOUNTER — Ambulatory Visit: Attending: Cardiology | Admitting: Cardiology

## 2024-06-24 ENCOUNTER — Encounter: Payer: Self-pay | Admitting: Cardiology

## 2024-06-24 VITALS — BP 112/58 | HR 68 | Ht 69.5 in | Wt 211.4 lb

## 2024-06-24 DIAGNOSIS — D6869 Other thrombophilia: Secondary | ICD-10-CM | POA: Insufficient documentation

## 2024-06-24 DIAGNOSIS — I4892 Unspecified atrial flutter: Secondary | ICD-10-CM | POA: Insufficient documentation

## 2024-06-24 DIAGNOSIS — I10 Essential (primary) hypertension: Secondary | ICD-10-CM | POA: Insufficient documentation

## 2024-06-24 DIAGNOSIS — I453 Trifascicular block: Secondary | ICD-10-CM | POA: Insufficient documentation

## 2024-06-24 NOTE — Patient Instructions (Signed)
 Medication Instructions:  Your physician recommends that you continue on your current medications as directed. Please refer to the Current Medication list given to you today.  *If you need a refill on your cardiac medications before your next appointment, please call your pharmacy*  Lab Work: None ordered   Testing/Procedures: None ordered  Follow-Up: At [facility], you and your health needs are our priority.  As part of our continuing mission to provide you with exceptional heart care, our providers are all part of one team.  This team includes your primary Cardiologist (physician) and Advanced Practice Providers or APPs (Physician Assistants and Nurse Practitioners) who all work together to provide you with the care you need, when you need it.  Your next appointment:   6 month(s)  Provider:   Agatha Horsfall, MD     Thank you for choosing [facility]!!   Reece Cane, RN [phone]

## 2024-06-25 DIAGNOSIS — Z8679 Personal history of other diseases of the circulatory system: Secondary | ICD-10-CM | POA: Insufficient documentation

## 2024-07-26 ENCOUNTER — Ambulatory Visit: Attending: Cardiology | Admitting: Cardiology

## 2024-07-26 ENCOUNTER — Encounter: Payer: Self-pay | Admitting: Cardiology

## 2024-07-26 VITALS — BP 122/66 | HR 61 | Ht 69.5 in | Wt 214.0 lb

## 2024-07-26 DIAGNOSIS — I452 Bifascicular block: Secondary | ICD-10-CM | POA: Insufficient documentation

## 2024-07-26 DIAGNOSIS — I420 Dilated cardiomyopathy: Secondary | ICD-10-CM | POA: Insufficient documentation

## 2024-07-26 DIAGNOSIS — I4892 Unspecified atrial flutter: Secondary | ICD-10-CM | POA: Insufficient documentation

## 2024-07-26 DIAGNOSIS — E119 Type 2 diabetes mellitus without complications: Secondary | ICD-10-CM | POA: Insufficient documentation

## 2024-07-26 DIAGNOSIS — E782 Mixed hyperlipidemia: Secondary | ICD-10-CM | POA: Insufficient documentation

## 2024-07-26 DIAGNOSIS — I10 Essential (primary) hypertension: Secondary | ICD-10-CM | POA: Diagnosis present

## 2024-07-26 MED ORDER — APIXABAN 2.5 MG PO TABS: 2.5000 mg | ORAL_TABLET | Freq: Two times a day (BID) | ORAL | Status: DC

## 2024-07-26 NOTE — Addendum Note (Signed)
 Addended by: ONEITA BERLINER on: 07/26/2024 11:51 AM   Modules accepted: Orders

## 2024-07-26 NOTE — Patient Instructions (Signed)
Medication Instructions:  Your physician recommends that you continue on your current medications as directed. Please refer to the Current Medication list given to you today.  *If you need a refill on your cardiac medications before your next appointment, please call your pharmacy*   Lab Work: None ordered If you have labs (blood work) drawn today and your tests are completely normal, you will receive your results only by: MyChart Message (if you have MyChart) OR A paper copy in the mail If you have any lab test that is abnormal or we need to change your treatment, we will call you to review the results.   Testing/Procedures: None ordered   Follow-Up: At [facility], you and your health needs are our priority.  As part of our continuing mission to provide you with exceptional heart care, we have created designated Provider Care Teams.  These Care Teams include your primary Cardiologist (physician) and Advanced Practice Providers (APPs -  Physician Assistants and Nurse Practitioners) who all work together to provide you with the care you need, when you need it.  We recommend signing up for the patient portal called "MyChart".  Sign up information is provided on this After Visit Summary.  MyChart is used to connect with patients for Virtual Visits (Telemedicine).  Patients are able to view lab/test results, encounter notes, upcoming appointments, etc.  Non-urgent messages can be sent to your provider as well.   To learn more about what you can do with MyChart, go to [email or web address].    Your next appointment:   3 month(s)  The format for your next appointment:   In Person  Provider:   Rajan Revankar, MD    Other Instructions none  Important Information About Sugar      

## 2024-07-26 NOTE — Progress Notes (Signed)
 Cardiology Office Note:    Date:  07/26/2024   ID:  Phillip Neal, DOB [birth year], MRN [id number]  PCP:  Thurmond Cathlyn LABOR., MD  Cardiologist:  Lamar Fitch, MD    Referring MD: Thurmond Cathlyn LABOR., MD   No chief complaint on file.   History of Present Illness:    Phillip Neal is a 89 y.o. male past history significant for diminished ejection fraction, with normalization, trifascicular block, atypical chest pain with negative stress test few months ago, atrial flutter that required cardioversion after that he was noted to have prolonged PR interval.  Comes today to my office for follow-up overall he said he is doing fine he did see our EP colleagues with consideration of pacemaker implantation they do not think there is indication for this yet but he will be watched very carefully he described to have some episode of dizziness but that happen typically if he does not drink enough fluid if he does drink plenty of fluid then he does not have any dizziness.  Denies have any chest pain tightness squeezing pressure burning chest.  Past Medical History:  Diagnosis Date   Aftercare following joint replacement surgery 03/06/2018   Anemia of unknown etiology 08/15/2021   Asbestos exposure 04/01/2020   At high risk for injury related to fall 12/27/2023   Atopic dermatitis 01/08/2020   Atypical chest pain 09/27/2017   Benign prostatic hyperplasia 03/22/2016   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/11/2016   Bilateral hearing loss 08/04/2023   Cancer (HCC)    squamous cell removed 3 yrs ago from upper chest by neck   Cardiomyopathy (HCC) 01/08/2020   Formatting of this note might be different from the original. EF 50-55%. Estle Sabella.  Scar on ST 2019. PVC on monitor.   Chronic gout involving toe of left foot without tophus 12/27/2023   Chronic rhinitis 11/07/2018   Chronic venous insufficiency 07/28/2022   CKD (chronic kidney disease), stage III (HCC) 01/11/2016   Class 1 obesity due  to excess calories with serious comorbidity and body mass index (BMI) of 33.0 to 33.9 in adult 03/22/2016   Cough last week   no phlegm jill dr melodi pa aware   Diabetes mellitus with stage 3 chronic kidney disease (HCC) 01/11/2016   Diabetes mellitus without complication (HCC)    Dyspnea on exertion 04/01/2020   Elevated prostate specific antigen (PSA) 03/22/2016   Formatting of this note might be different from the original. No longer following.   Encounter for other administrative examinations 03/25/2024   Essential hypertension 01/11/2016   GERD without esophagitis 05/01/2018   Gouty arthropathy 03/22/2016   Hematest positive stools 01/18/2023   History of atrial flutter 06/25/2024   Shcked 2025.     History of iron deficiency 05/28/2018   Formatting of this note might be different from the original. After surgeries. Follow. No longer anemic.   History of kidney stones    years ago, passed on own   Hyperlipidemia    Hypertension    Mixed hyperlipidemia 01/11/2016   Myalgia due to statin 08/13/2021   OA (osteoarthritis) of hip 01/31/2018   Onychomycosis 02/14/2022   Paroxysmal atrial flutter (HCC) 01/11/2024   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   Primary osteoarthritis of both knees 05/10/2024   Sensorineural hearing loss, bilateral 02/03/2023   Shortness of breath 01/21/2021   Strain of knee 11/25/2019   Syncope and collapse 01/01/2021   Vertigo    jan  2019 surgery [place] no vertigo since end of january   Vitamin B12 deficiency 01/11/2016   Weakness 12/27/2023    Past Surgical History:  Procedure Laterality Date   CARDIOVERSION N/A 04/04/2024   Procedure: CARDIOVERSION;  Surgeon: Sheena Pugh, DO;  [facility]: [facility];  Service: Cardiovascular;  Laterality: N/A;   colonscopy     with polyp removal   HERNIA REPAIR  1954   left inguinal hernia   TOTAL HIP ARTHROPLASTY Left 01/31/2018   Procedure: LEFT TOTAL  HIP ARTHROPLASTY ANTERIOR APPROACH;  Surgeon: Melodi Lerner, MD;  Location: [facility];  Service: Orthopedics;  Laterality: Left;   TOTAL HIP ARTHROPLASTY Right 05/16/2018   Procedure: RIGHT TOTAL HIP ARTHROPLASTY ANTERIOR APPROACH;  Surgeon: Melodi Lerner, MD;  Location: [facility];  Service: Orthopedics;  Laterality: Right;    Current Medications: Current Meds  Medication Sig   allopurinol (ZYLOPRIM) 100 MG tablet Take 1 tablet by mouth daily.   amLODipine  (NORVASC ) 5 MG tablet Take 1 tablet (5 mg total) by mouth daily.   apixaban  (ELIQUIS ) 2.5 MG TABS tablet Take 1 tablet (2.5 mg total) by mouth 2 (two) times daily.   Cholecalciferol (VITAMIN D3) 25 MCG (1000 UT) CAPS Take 1 capsule by mouth daily.   clotrimazole-betamethasone (LOTRISONE) cream Apply 1 Application topically 2 (two) times daily.   Coenzyme Q10 (CO Q 10) 100 MG CAPS Take 100 mg by mouth 2 (two) times daily.   dicyclomine (BENTYL) 10 MG capsule Take 10 mg by mouth as needed for cramping.   ezetimibe  (ZETIA ) 10 MG tablet Take 10 mg by mouth daily.   fluconazole (DIFLUCAN) 200 MG tablet Take 200 mg by mouth once a week.   fluticasone  (FLONASE ) 50 MCG/ACT nasal spray Place 2 sprays into both nostrils daily as needed for allergies or rhinitis.   furosemide  (LASIX ) 20 MG tablet Take 1 tablet (20 mg total) by mouth daily.   lisinopril (PRINIVIL,ZESTRIL) 40 MG tablet Take 40 mg by mouth daily.    metFORMIN (GLUCOPHAGE-XR) 500 MG 24 hr tablet Take 1,000 mg by mouth 2 (two) times daily.    vitamin B-12 (CYANOCOBALAMIN) 1000 MCG tablet Take 1,000 mcg by mouth daily.     Allergies:   Sulfa antibiotics, Ciprofloxacin, Other, Statins, and Sulfamethoxazole   Social History   Socioeconomic History   Marital status: Widowed    Spouse name: Not on file   Number of children: 3   Years of education: Not on file   Highest education level: Not on file  Occupational History   Not on file  Tobacco Use   Smoking status: Never   Smokeless  tobacco: Never  Vaping Use   Vaping status: Never Used  Substance and Sexual Activity   Alcohol use: Never   Drug use: Never   Sexual activity: Not on file  Other Topics Concern   Not on file  Social History Narrative   Not on file   Social Drivers of Health   Financial Resource Strain: Not on file  Food Insecurity: Low Risk  (08/22/2023)   Received from [facility]   Hunger Vital Sign    Within the past 12 months, you worried that your food would run out before you got money to buy more: Never true    Within the past 12 months, the food you bought just didn't last and you didn't have money to get more. : Never true  Transportation Needs: No Transportation Needs (08/22/2023)   Received from [employer]  In the past 12 months, has lack of reliable transportation kept you from medical appointments, meetings, work or from getting things needed for daily living? : No  Physical Activity: Not on file  Stress: Not on file  Social Connections: Not on file     Family History: [name] family history includes Heart failure in his mother. ROS:   Please see the history of present illness.    All 14 point review of systems negative except as described per history of present illness  EKGs/Labs/Other Studies Reviewed:         Recent Labs: 01/15/2024: B Natriuretic Peptide 106.5 02/20/2024: NT-Pro BNP 514 03/26/2024: BUN 30; Creatinine, Ser 1.46; Hemoglobin 14.4; Platelets 230; Potassium 4.5; Sodium 145  Recent Lipid Panel No results found for: CHOL, TRIG, HDL, CHOLHDL, VLDL, LDLCALC, LDLDIRECT  Physical Exam:    VS:  BP 122/66   Pulse 61   Ht 5' 9.5 (1.765 m)   Wt 214 lb (97.1 kg)   SpO2 99%   BMI 31.15 kg/m     Wt Readings from Last 3 Encounters:  07/26/24 214 lb (97.1 kg)  06/24/24 211 lb 6.4 oz (95.9 kg)  04/25/24 213 lb (96.6 kg)     GEN:  Well nourished, well developed in no acute distress HEENT: Normal NECK: No JVD; No  carotid bruits LYMPHATICS: No lymphadenopathy CARDIAC: RRR, no murmurs, no rubs, no gallops RESPIRATORY:  Clear to auscultation without rales, wheezing or rhonchi  ABDOMEN: Soft, non-tender, non-distended MUSCULOSKELETAL:  No edema; No deformity  SKIN: Warm and dry LOWER EXTREMITIES: no swelling NEUROLOGIC:  Alert and oriented x 3 PSYCHIATRIC:  Normal affect   ASSESSMENT:    1. Atrial flutter, unspecified type (HCC)   2. Bifascicular bundle branch block   3. Dilated cardiomyopathy (HCC)   4. Essential hypertension   5. Diabetes mellitus without complication (HCC)   6. Mixed hyperlipidemia    PLAN:    In order of problems listed above:  Atrial flutter will do EKG today to confirm the rhythm he sounds very regular continue anticoagulation. Bifascicular block.  No indication for pacemaker yet but will watch very carefully I will see him back in my office in about 3 months I warned him if he developed dizzy spells or passing out he is to let me know. History of dilated cardiomyopathy with normalization based on last echocardiogram continue monitoring. Dyslipidemia he is on Zetia  we will continue.   Medication Adjustments/Labs and Tests Ordered: Current medicines are reviewed at length with the patient today.  Concerns regarding medicines are outlined above.  Orders Placed This Encounter  Procedures   EKG 12-Lead   Medication changes: No orders of the defined types were placed in this encounter.   Signed, Lamar DOROTHA Fitch, MD, [facility] 07/26/2024 11:39 AM    [facility]

## 2024-10-20 ENCOUNTER — Other Ambulatory Visit: Payer: Self-pay | Admitting: Cardiology

## 2024-10-22 NOTE — Telephone Encounter (Signed)
 Prescription refill request for Eliquis  received. Indication: afib  Last office visit: Bernie, 07/26/2024 Scr:  1.68, 06/25/2024 Age: 89 yo  Weight:  97.1 kg   Refill sent.

## 2024-10-30 ENCOUNTER — Ambulatory Visit: Attending: Cardiology | Admitting: Cardiology

## 2024-10-30 ENCOUNTER — Encounter: Payer: Self-pay | Admitting: Cardiology

## 2024-10-30 VITALS — BP 134/72 | HR 68 | Ht 69.25 in | Wt 213.4 lb

## 2024-10-30 DIAGNOSIS — I452 Bifascicular block: Secondary | ICD-10-CM | POA: Diagnosis not present

## 2024-10-30 DIAGNOSIS — I420 Dilated cardiomyopathy: Secondary | ICD-10-CM | POA: Insufficient documentation

## 2024-10-30 DIAGNOSIS — I10 Essential (primary) hypertension: Secondary | ICD-10-CM | POA: Diagnosis not present

## 2024-10-30 DIAGNOSIS — N183 Chronic kidney disease, stage 3 unspecified: Secondary | ICD-10-CM | POA: Insufficient documentation

## 2024-10-30 DIAGNOSIS — R0789 Other chest pain: Secondary | ICD-10-CM | POA: Diagnosis present

## 2024-10-30 DIAGNOSIS — E1122 Type 2 diabetes mellitus with diabetic chronic kidney disease: Secondary | ICD-10-CM | POA: Diagnosis present

## 2024-10-30 NOTE — Progress Notes (Signed)
 Cardiology Office Note:    Date:  10/30/2024   ID:  Shiva Sahagian Malmstrom, DOB 06-21-1931, MRN [id number]  PCP:  Thurmond Cathlyn LABOR., MD  Cardiologist:  Lamar Fitch, MD    Referring MD: Thurmond Cathlyn LABOR., MD   No chief complaint on file. Doing well  History of Present Illness:    Stokely Jeancharles Hays is a 89 y.o. male past medical history significant for diminished ejection fraction with normalization, trifascicular block, atypical chest pain with negative stress test, atrial flutter did not require cardioversion maintain sinus rhythm with prolonged PR interval comes today to months for follow-up, cardiac wise doing well.  Denies having chest pain tightness squeezing pressure burning chest no dizziness overall is doing well.  Mother is 44 years old  Past Medical History:  Diagnosis Date   Aftercare following joint replacement surgery 03/06/2018   Anemia of unknown etiology 08/15/2021   Asbestos exposure 04/01/2020   At high risk for injury related to fall 12/27/2023   Atopic dermatitis 01/08/2020   Atypical chest pain 09/27/2017   Benign prostatic hyperplasia 03/22/2016   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/11/2016   Bilateral hearing loss 08/04/2023   Cancer (HCC)    squamous cell removed 3 yrs ago from upper chest by neck   Cardiomyopathy (HCC) 01/08/2020   Formatting of this note might be different from the original. EF 50-55%. [place] Theiss.  Scar on ST 2019. PVC on monitor.   Chronic gout involving toe of left foot without tophus 12/27/2023   Chronic rhinitis 11/07/2018   Chronic venous insufficiency 07/28/2022   CKD (chronic kidney disease), stage III (HCC) 01/11/2016   Class 1 obesity due to excess calories with serious comorbidity and body mass index (BMI) of 33.0 to 33.9 in adult 03/22/2016   Cough last week   no phlegm jill dr melodi pa aware   Diabetes mellitus with stage 3 chronic kidney disease (HCC) 01/11/2016   Diabetes mellitus without complication (HCC)    Dyspnea  on exertion 04/01/2020   Elevated prostate specific antigen (PSA) 03/22/2016   Formatting of this note might be different from the original. No longer following.   Encounter for other administrative examinations 03/25/2024   Essential hypertension 01/11/2016   GERD without esophagitis 05/01/2018   Gouty arthropathy 03/22/2016   Hematest positive stools 01/18/2023   History of atrial flutter 06/25/2024   Shcked 2025.     History of iron deficiency 05/28/2018   Formatting of this note might be different from the original. After surgeries. Follow. No longer anemic.   History of kidney stones    years ago, passed on own   Hyperlipidemia    Hypertension    Mixed hyperlipidemia 01/11/2016   Myalgia due to statin 08/13/2021   OA (osteoarthritis) of hip 01/31/2018   Onychomycosis 02/14/2022   Paroxysmal atrial flutter (HCC) 01/11/2024   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   Primary osteoarthritis of both knees 05/10/2024   Sensorineural hearing loss, bilateral 02/03/2023   Shortness of breath 01/21/2021   Strain of knee 11/25/2019   Syncope and collapse 01/01/2021   Vertigo    jan  2019 surgery [place] no vertigo since end of january   Vitamin B12 deficiency 01/11/2016   Weakness 12/27/2023    Past Surgical History:  Procedure Laterality Date   CARDIOVERSION N/A 04/04/2024   Procedure: CARDIOVERSION;  Surgeon: Sheena Pugh, DO;  Location: [facility];  Service: Cardiovascular;  Laterality: N/A;   colonscopy  with polyp removal   HERNIA REPAIR  1954   left inguinal hernia   TOTAL HIP ARTHROPLASTY Left 01/31/2018   Procedure: LEFT TOTAL HIP ARTHROPLASTY ANTERIOR APPROACH;  Surgeon: Melodi Lerner, MD;  Location: [facility];  Service: Orthopedics;  Laterality: Left;   TOTAL HIP ARTHROPLASTY Right 05/16/2018   Procedure: RIGHT TOTAL HIP ARTHROPLASTY ANTERIOR APPROACH;  Surgeon: Melodi Lerner, MD;  Location: [facility];  Service:  Orthopedics;  Laterality: Right;    Current Medications: Current Meds  Medication Sig   allopurinol (ZYLOPRIM) 100 MG tablet Take 1 tablet by mouth daily.   amLODipine  (NORVASC ) 5 MG tablet Take 1 tablet (5 mg total) by mouth daily.   apixaban  (ELIQUIS ) 2.5 MG TABS tablet TAKE 1 TABLET BY MOUTH TWICE  DAILY   Cholecalciferol (VITAMIN D3) 25 MCG (1000 UT) CAPS Take 1 capsule by mouth daily.   clotrimazole-betamethasone (LOTRISONE) cream Apply 1 Application topically 2 (two) times daily.   Coenzyme Q10 (CO Q 10) 100 MG CAPS Take 100 mg by mouth 2 (two) times daily.   dicyclomine (BENTYL) 10 MG capsule Take 10 mg by mouth as needed for cramping.   ezetimibe  (ZETIA ) 10 MG tablet Take 10 mg by mouth daily.   fluconazole (DIFLUCAN) 200 MG tablet Take 200 mg by mouth once a week.   fluticasone  (FLONASE ) 50 MCG/ACT nasal spray Place 2 sprays into both nostrils daily as needed for allergies or rhinitis.   furosemide  (LASIX ) 20 MG tablet Take 1 tablet (20 mg total) by mouth daily.   lisinopril (PRINIVIL,ZESTRIL) 40 MG tablet Take 40 mg by mouth daily.    metFORMIN (GLUCOPHAGE-XR) 500 MG 24 hr tablet Take 500 mg by mouth 2 (two) times daily with a meal.   vitamin B-12 (CYANOCOBALAMIN) 1000 MCG tablet Take 1,000 mcg by mouth daily.     Allergies:   Sulfa antibiotics, Ciprofloxacin, Other, Statins, and Sulfamethoxazole   Social History   Socioeconomic History   Marital status: Widowed    Spouse name: Not on file   Number of children: 3   Years of education: Not on file   Highest education level: Not on file  Occupational History   Not on file  Tobacco Use   Smoking status: Never   Smokeless tobacco: Never  Vaping Use   Vaping status: Never Used  Substance and Sexual Activity   Alcohol use: Never   Drug use: Never   Sexual activity: Not on file  Other Topics Concern   Not on file  Social History Narrative   Not on file   Social Drivers of Health   Financial Resource Strain: Not on  file  Food Insecurity: Low Risk  (08/22/2023)   Received from [facility]   Hunger Vital Sign    Within the past 12 months, you worried that your food would run out before you got money to buy more: Never true    Within the past 12 months, the food you bought just didn't last and you didn't have money to get more. : Never true  Transportation Needs: No Transportation Needs (08/22/2023)   Received from [employer]    In the past 12 months, has lack of reliable transportation kept you from medical appointments, meetings, work or from getting things needed for daily living? : No  Physical Activity: Not on file  Stress: Not on file  Social Connections: Not on file     Family History: [name] family history includes Heart failure in his mother.  ROS:   Please see the history of present illness.    All 14 point review of systems negative except as described per history of present illness  EKGs/Labs/Other Studies Reviewed:         Recent Labs: 01/15/2024: B Natriuretic Peptide 106.5 02/20/2024: NT-Pro BNP 514 03/26/2024: BUN 30; Creatinine, Ser 1.46; Hemoglobin 14.4; Platelets 230; Potassium 4.5; Sodium 145  Recent Lipid Panel No results found for: CHOL, TRIG, HDL, CHOLHDL, VLDL, LDLCALC, LDLDIRECT  Physical Exam:    VS:  BP 134/72   Pulse 68   Ht 5' 9.25 (1.759 m)   Wt 213 lb 6.4 oz (96.8 kg)   SpO2 97%   BMI 31.29 kg/m     Wt Readings from Last 3 Encounters:  10/30/24 213 lb 6.4 oz (96.8 kg)  07/26/24 214 lb (97.1 kg)  06/24/24 211 lb 6.4 oz (95.9 kg)     GEN:  Well nourished, well developed in no acute distress HEENT: Normal NECK: No JVD; No carotid bruits LYMPHATICS: No lymphadenopathy CARDIAC: RRR, no murmurs, no rubs, no gallops RESPIRATORY:  Clear to auscultation without rales, wheezing or rhonchi  ABDOMEN: Soft, non-tender, non-distended MUSCULOSKELETAL:  No edema; No deformity  SKIN: Warm and dry LOWER EXTREMITIES: no  swelling NEUROLOGIC:  Alert and oriented x 3 PSYCHIATRIC:  Normal affect   ASSESSMENT:    1. Dilated cardiomyopathy (HCC)   2. Bifascicular bundle branch block   3. Essential hypertension   4. Diabetes mellitus with stage 3 chronic kidney disease (HCC)   5. Atypical chest pain    PLAN:    In order of problems listed above:  Dilated cardiomyopathy last echocardiogram showed preserved ejection fraction continue monitoring. Bifascicular block, no dizziness no passing out continue monitoring. Essential hypertension blood pressure well-controlled continue present management. Diabetes followed by primary care physician.  Look like he is going to change primary care physician from Dr. Rexford to Dr. Dottie.  He also asked me to refer him to endocrinology   Medication Adjustments/Labs and Tests Ordered: Current medicines are reviewed at length with the patient today.  Concerns regarding medicines are outlined above.  No orders of the defined types were placed in this encounter.  Medication changes: No orders of the defined types were placed in this encounter.   Signed, Lamar DOROTHA Fitch, MD, [facility] 10/30/2024 10:15 AM    [facility]

## 2024-10-30 NOTE — Patient Instructions (Signed)
 Medication Instructions:  Your physician recommends that you continue on your current medications as directed. Please refer to the Current Medication list given to you today.  *If you need a refill on your cardiac medications before your next appointment, please call your pharmacy*  Lab Work: NONE If you have labs (blood work) drawn today and your tests are completely normal, you will receive your results only by: MyChart Message (if you have MyChart) OR A paper copy in the mail If you have any lab test that is abnormal or we need to change your treatment, we will call you to review the results.  Testing/Procedures: NONE  Follow-Up: At [facility], you and your health needs are our priority.  As part of our continuing mission to provide you with exceptional heart care, our providers are all part of one team.  This team includes your primary Cardiologist (physician) and Advanced Practice Providers or APPs (Physician Assistants and Nurse Practitioners) who all work together to provide you with the care you need, when you need it.  Your next appointment:   6 month(s)  Provider:   Gypsy Balsam, MD    We recommend signing up for the patient portal called "MyChart".  Sign up information is provided on this After Visit Summary.  MyChart is used to connect with patients for Virtual Visits (Telemedicine).  Patients are able to view lab/test results, encounter notes, upcoming appointments, etc.  Non-urgent messages can be sent to your provider as well.   To learn more about what you can do with MyChart, go to [email or web address].   Other Instructions

## 2024-11-04 ENCOUNTER — Encounter (HOSPITAL_BASED_OUTPATIENT_CLINIC_OR_DEPARTMENT_OTHER): Payer: Self-pay

## 2024-11-05 ENCOUNTER — Ambulatory Visit (HOSPITAL_BASED_OUTPATIENT_CLINIC_OR_DEPARTMENT_OTHER): Admitting: Family Medicine

## 2024-11-12 ENCOUNTER — Ambulatory Visit (INDEPENDENT_AMBULATORY_CARE_PROVIDER_SITE_OTHER): Admitting: Family Medicine

## 2024-11-12 ENCOUNTER — Encounter (HOSPITAL_BASED_OUTPATIENT_CLINIC_OR_DEPARTMENT_OTHER): Payer: Self-pay | Admitting: Family Medicine

## 2024-11-12 VITALS — BP 111/67 | HR 76 | Temp 98.2°F | Resp 16 | Ht 68.9 in | Wt 213.2 lb

## 2024-11-12 DIAGNOSIS — I440 Atrioventricular block, first degree: Secondary | ICD-10-CM | POA: Insufficient documentation

## 2024-11-12 DIAGNOSIS — E782 Mixed hyperlipidemia: Secondary | ICD-10-CM

## 2024-11-12 DIAGNOSIS — I10 Essential (primary) hypertension: Secondary | ICD-10-CM

## 2024-11-12 DIAGNOSIS — E1122 Type 2 diabetes mellitus with diabetic chronic kidney disease: Secondary | ICD-10-CM

## 2024-11-12 NOTE — Progress Notes (Unsigned)
 Established Patient Office Visit  Subjective   Patient ID: Phillip Neal, male    DOB: 09/22/1931  Age: 89 y.o. MRN: [id number]  Chief Complaint  Patient presents with   Establish Care    Establish Care     HPI Discussed the use of AI scribe software for clinical note transcription with the patient, who gave verbal consent to proceed.  History of Present Illness   Phillip Neal is a 89 year old male with diabetes who presents with difficulty managing blood sugar levels.  He was diagnosed with diabetes about 15 years ago. He was initially on metformin 500 mg daily, later increased to a total of 2000 mg per day about four years ago with better blood sugar control. About four months ago his dose was decreased to 500 mg twice daily, after which he has had higher fasting sugars, often 160-200 mg/dL after sweets or heavy meals.  We don't have records from his PCP yet.  He has numbness and tingling in the front third of both feet that has been mild and stable.  He has a delayed heartbeat that he associates with a remote boxing injury. He notes an irregular bump in his heartbeat that is more noticeable with exertion. He was previously evaluated for a pacemaker and instead tried medication, which caused marked weakness and bradycardia, so it was stopped.      Past Medical History:  Diagnosis Date   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/11/2016   Bilateral hearing loss 08/04/2023   Cardiomyopathy (HCC) 01/08/2020   f/by Dr. Bernie   Chronic gout involving toe of left foot without tophus 12/27/2023   Chronic venous insufficiency 07/28/2022   CKD (chronic kidney disease), stage III (HCC) 01/11/2016   Class 1 obesity due to excess calories with serious comorbidity and body mass index (BMI) of 33.0 to 33.9 in adult 03/22/2016   Diabetes mellitus with stage 3 chronic kidney disease (HCC) 01/11/2016   Essential hypertension 01/11/2016   GERD without esophagitis  05/01/2018   Gouty arthropathy 03/22/2016   History of atrial flutter 06/25/2024   Shcked 2025.     History of iron deficiency 05/28/2018   Formatting of this note might be different from the original. After surgeries. Follow. No longer anemic.   History of kidney stones    years ago, passed on own   Hyperlipidemia    Hypertension    Mixed hyperlipidemia 01/11/2016   Myalgia due to statin 08/13/2021   OA (osteoarthritis) of hip 01/31/2018   Onychomycosis 02/14/2022   Paroxysmal atrial flutter (HCC) 01/11/2024   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   Sensorineural hearing loss, bilateral 02/03/2023   Vitamin B12 deficiency 01/11/2016    Outpatient Encounter Medications as of 11/12/2024  Medication Sig   allopurinol (ZYLOPRIM) 100 MG tablet Take 1 tablet by mouth daily.   amLODipine  (NORVASC ) 5 MG tablet Take 1 tablet (5 mg total) by mouth daily.   apixaban  (ELIQUIS ) 2.5 MG TABS tablet TAKE 1 TABLET BY MOUTH TWICE  DAILY   Cholecalciferol (VITAMIN D3) 25 MCG (1000 UT) CAPS Take 1 capsule by mouth daily.   clotrimazole-betamethasone (LOTRISONE) cream Apply 1 Application topically 2 (two) times daily.   Coenzyme Q10 (CO Q 10) 100 MG CAPS Take 100 mg by mouth 2 (two) times daily.   dicyclomine (BENTYL) 10 MG capsule Take 10 mg by mouth as needed for cramping.   ezetimibe  (ZETIA ) 10 MG tablet Take 10 mg by mouth daily.   fluconazole (  DIFLUCAN) 200 MG tablet Take 200 mg by mouth once a week.   furosemide  (LASIX ) 20 MG tablet Take 1 tablet (20 mg total) by mouth daily.   lisinopril (PRINIVIL,ZESTRIL) 40 MG tablet Take 40 mg by mouth daily.    metFORMIN (GLUCOPHAGE-XR) 500 MG 24 hr tablet Take 500 mg by mouth 2 (two) times daily with a meal.   vitamin B-12 (CYANOCOBALAMIN) 1000 MCG tablet Take 1,000 mcg by mouth daily.   fluticasone  (FLONASE ) 50 MCG/ACT nasal spray Place 2 sprays into both nostrils daily as needed for allergies or rhinitis. (Patient not taking:  Reported on 11/12/2024)   No facility-administered encounter medications on file as of 11/12/2024.    Social History[1]    Review of Systems  Constitutional:  Negative for diaphoresis, fever, malaise/fatigue and weight loss.  Respiratory:  Negative for cough, shortness of breath and wheezing.   Cardiovascular:  Positive for leg swelling. Negative for chest pain, palpitations, orthopnea, claudication and PND.      Objective:     BP 111/67 (BP Location: Right Arm, Patient Position: Standing, Cuff Size: Normal)   Pulse 76   Temp 98.2 F (36.8 C) (Oral)   Resp 16   Ht 5' 8.9 (1.75 m)   Wt 213 lb 3.2 oz (96.7 kg)   SpO2 94%   BMI 31.58 kg/m    Physical Exam Constitutional:      General: He is not in acute distress.    Appearance: Normal appearance. He is obese.  HENT:     Head: Normocephalic.  Neck:     Vascular: No carotid bruit.  Cardiovascular:     Rate and Rhythm: Normal rate and regular rhythm.     Pulses: Normal pulses.     Heart sounds: Normal heart sounds.  Pulmonary:     Effort: Pulmonary effort is normal.     Breath sounds: Normal breath sounds.  Abdominal:     General: Bowel sounds are normal.     Palpations: Abdomen is soft.  Musculoskeletal:     Cervical back: Neck supple. No tenderness.     Right lower leg: Edema present.     Left lower leg: Edema present.     Comments: Chronic 1+.  LE edema.  Neurological:     Mental Status: He is alert.      No results found for any visits on 11/12/24.    The ASCVD Risk score (Arnett DK, et al., 2019) failed to calculate for the following reasons:   The 2019 ASCVD risk score is only valid for ages 55 to 24   * - Cholesterol units were assumed    Assessment & Plan:  Mixed hyperlipidemia Assessment & Plan: Continue Zetia .  Encouraged some weight loss.  Promptly request old records and labs.   Primary hypertension Assessment & Plan: Satisfactory control.  DASH diet.  Continue  Lisinopril.   Diabetes mellitus with stage 3 chronic kidney disease (HCC) Assessment & Plan: Await lab records.  Continue Metformin for now, though obviously need his latest GFR.  He had labs only about 2 months ago and is resistant to repeat labs today.   Primary osteoarthritis, unspecified site Assessment & Plan: Satisfactory pain control.  Tylenol  prn.   Chronic venous insufficiency Assessment & Plan: Mild.  Support stocking as tolerated.  Obviously properly fitted.     Return in about 4 weeks (around 12/10/2024) for chronic follow-up.    REDDING PONCE NORLEEN FALCON., MD     [1]  Social History Tobacco Use  Smoking status: Never   Smokeless tobacco: Never  Vaping Use   Vaping status: Never Used  Substance Use Topics   Alcohol use: Never   Drug use: Never

## 2024-11-13 ENCOUNTER — Encounter (HOSPITAL_BASED_OUTPATIENT_CLINIC_OR_DEPARTMENT_OTHER): Payer: Self-pay | Admitting: Family Medicine

## 2024-11-13 DIAGNOSIS — M1990 Unspecified osteoarthritis, unspecified site: Secondary | ICD-10-CM | POA: Insufficient documentation

## 2024-11-13 NOTE — Assessment & Plan Note (Signed)
 Continue Zetia .  Encouraged some weight loss.  Promptly request old records and labs.

## 2024-11-13 NOTE — Assessment & Plan Note (Signed)
 Satisfactory pain control.  Tylenol  prn.

## 2024-11-13 NOTE — Assessment & Plan Note (Signed)
 Mild.  Support stocking as tolerated.  Obviously properly fitted.

## 2024-11-13 NOTE — Assessment & Plan Note (Signed)
 Satisfactory control.  DASH diet.  Continue Lisinopril.

## 2024-11-13 NOTE — Assessment & Plan Note (Addendum)
 Await lab records.  Continue Metformin for now, though obviously need his latest GFR.  He had labs only about 2 months ago and is resistant to repeat labs today.

## 2024-12-01 ENCOUNTER — Other Ambulatory Visit: Payer: Self-pay | Admitting: Cardiology

## 2024-12-16 ENCOUNTER — Ambulatory Visit (INDEPENDENT_AMBULATORY_CARE_PROVIDER_SITE_OTHER): Admitting: Family Medicine

## 2024-12-16 ENCOUNTER — Encounter (HOSPITAL_BASED_OUTPATIENT_CLINIC_OR_DEPARTMENT_OTHER): Payer: Self-pay | Admitting: Family Medicine

## 2024-12-16 VITALS — BP 123/77 | HR 71 | Wt 212.7 lb

## 2024-12-16 DIAGNOSIS — I951 Orthostatic hypotension: Secondary | ICD-10-CM | POA: Diagnosis not present

## 2024-12-16 DIAGNOSIS — I872 Venous insufficiency (chronic) (peripheral): Secondary | ICD-10-CM

## 2024-12-16 DIAGNOSIS — E1122 Type 2 diabetes mellitus with diabetic chronic kidney disease: Secondary | ICD-10-CM | POA: Diagnosis not present

## 2024-12-16 DIAGNOSIS — N183 Chronic kidney disease, stage 3 unspecified: Secondary | ICD-10-CM | POA: Diagnosis not present

## 2024-12-16 DIAGNOSIS — B356 Tinea cruris: Secondary | ICD-10-CM | POA: Diagnosis not present

## 2024-12-16 DIAGNOSIS — E785 Hyperlipidemia, unspecified: Secondary | ICD-10-CM

## 2024-12-16 DIAGNOSIS — R5383 Other fatigue: Secondary | ICD-10-CM | POA: Diagnosis not present

## 2024-12-16 MED ORDER — CLOTRIMAZOLE-BETAMETHASONE 1-0.05 % EX CREA: 1.0000 | TOPICAL_CREAM | Freq: Two times a day (BID) | CUTANEOUS | 2 refills | Status: AC

## 2024-12-16 NOTE — Assessment & Plan Note (Addendum)
 Properly fitted support stockings as tolerated.

## 2024-12-16 NOTE — Assessment & Plan Note (Addendum)
 Difficulty maintaining blood glucose levels after reduction of metformin dosage from 2000 mg to 1000 mg daily due to kidney function concerns. Blood glucose levels spike significantly postprandially, with occasional readings as high as 247 mg/dL. Previous self-adjustment to 2000 mg during holidays due to difficulty controlling glucose levels. - Repeated blood work to assess current status. - Will consider alternative medications for blood glucose control due to kidney function concerns. Orders:   Hemoglobin A1c   Comprehensive metabolic panel with GFR   CBC with Differential/Platelet

## 2024-12-16 NOTE — Progress Notes (Signed)
 "  Established Patient Office Visit  Subjective   Patient ID: Phillip Neal, male    DOB: Jul 30, 1931  Age: 89 y.o. MRN: [id number]  Chief Complaint  Patient presents with   Follow-up    Follow-up     Discussed the use of AI scribe software for clinical note transcription with the patient, who gave verbal consent to proceed.  History of Present Illness Kross Swallows Adamski Zell is a 89 year old male with diabetes who presents with persistent dizziness and difficulty managing blood sugar levels.  He experiences persistent dizziness described as a 'swimmy headed drunk feeling' that occurs primarily when walking. No vertigo, and the dizziness does not bother him when sitting or lying down. Dehydration exacerbates the dizziness, and drinking water  alleviates it.  He has a history of diabetes and has been on metformin for approximately 12 years. Initially, he was taking 500 mg twice daily, which was increased to 2000 mg daily about four years ago. Approximately four months ago, his dosage was reduced back to 500 mg twice daily to protect his kidneys. Since the reduction, he has struggled to maintain his blood sugar levels, which rise significantly after meals. His fasting blood sugar is typically between 87 and 105 mg/dL, but it can spike to 814-809 mg/dL after a meal and has reached as high as 247 mg/dL. During the holidays, he temporarily increased his metformin dose to manage his blood sugar levels better.  He uses Lotrisone  cream for a rash in his groin area, which he manages by keeping the area dry with medicated powder.  No sinus issues or vertigo. He confirms dizziness when walking but not when sitting or lying down.    Past Medical History:  Diagnosis Date   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/11/2016   Bilateral hearing loss 08/04/2023   Cardiomyopathy (HCC) 01/08/2020   f/by Dr. Bernie   Chronic gout involving toe of left foot without tophus 12/27/2023   Chronic  venous insufficiency 07/28/2022   CKD (chronic kidney disease), stage III (HCC) 01/11/2016   Class 1 obesity due to excess calories with serious comorbidity and body mass index (BMI) of 33.0 to 33.9 in adult 03/22/2016   Diabetes mellitus with stage 3 chronic kidney disease (HCC) 01/11/2016   Essential hypertension 01/11/2016   GERD without esophagitis 05/01/2018   Gouty arthropathy 03/22/2016   History of atrial flutter 06/25/2024   Shcked 2025.     History of iron deficiency 05/28/2018   Formatting of this note might be different from the original. After surgeries. Follow. No longer anemic.   History of kidney stones    years ago, passed on own   Hyperlipidemia    Hypertension    Mixed hyperlipidemia 01/11/2016   Myalgia due to statin 08/13/2021   OA (osteoarthritis) of hip 01/31/2018   Onychomycosis 02/14/2022   Paroxysmal atrial flutter (HCC) 01/11/2024   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   Sensorineural hearing loss, bilateral 02/03/2023   Vitamin B12 deficiency 01/11/2016    Outpatient Encounter Medications as of 12/16/2024  Medication Sig   Accu-Chek FastClix Lancets MISC Apply 1 each topically daily.   ACCU-CHEK GUIDE TEST test strip 1 each daily.   allopurinol (ZYLOPRIM) 100 MG tablet Take 1 tablet by mouth daily.   amLODipine  (NORVASC ) 5 MG tablet TAKE 1 TABLET BY MOUTH DAILY   apixaban  (ELIQUIS ) 2.5 MG TABS tablet TAKE 1 TABLET BY MOUTH TWICE  DAILY   Cholecalciferol (VITAMIN D3) 25 MCG (1000 UT) CAPS Take 1  capsule by mouth daily.   clotrimazole -betamethasone  (LOTRISONE ) cream Apply 1 Application topically 2 (two) times daily.   Coenzyme Q10 (CO Q 10) 100 MG CAPS Take 100 mg by mouth 2 (two) times daily.   dicyclomine (BENTYL) 10 MG capsule Take 10 mg by mouth as needed for cramping.   ezetimibe  (ZETIA ) 10 MG tablet Take 10 mg by mouth daily.   fluconazole (DIFLUCAN) 200 MG tablet Take 200 mg by mouth once a week.   furosemide  (LASIX ) 20 MG  tablet Take 1 tablet (20 mg total) by mouth daily.   lisinopril (PRINIVIL,ZESTRIL) 40 MG tablet Take 40 mg by mouth daily.    metFORMIN (GLUCOPHAGE-XR) 500 MG 24 hr tablet Take 500 mg by mouth 2 (two) times daily with a meal.   vitamin B-12 (CYANOCOBALAMIN) 1000 MCG tablet Take 1,000 mcg by mouth daily.   [DISCONTINUED] clotrimazole -betamethasone  (LOTRISONE ) cream Apply 1 Application topically 2 (two) times daily.   clotrimazole -betamethasone  (LOTRISONE ) cream Apply 1 Application topically 2 (two) times daily.   fluticasone  (FLONASE ) 50 MCG/ACT nasal spray Place 2 sprays into both nostrils daily as needed for allergies or rhinitis. (Patient not taking: Reported on 11/12/2024)   No facility-administered encounter medications on file as of 12/16/2024.    Social History[1]    Review of Systems  Constitutional:  Negative for diaphoresis, fever, malaise/fatigue and weight loss.  Respiratory:  Negative for cough, shortness of breath and wheezing.   Cardiovascular:  Negative for chest pain, palpitations, orthopnea, claudication, leg swelling and PND.      Objective:     BP 123/77 (Cuff Size: Normal)   Pulse 71   Wt 212 lb 11.2 oz (96.5 kg)   SpO2 94%   BMI 31.50 kg/m    Physical Exam Constitutional:      General: He is not in acute distress.    Appearance: Normal appearance. He is obese.  HENT:     Head: Normocephalic.  Neck:     Vascular: No carotid bruit.  Cardiovascular:     Rate and Rhythm: Normal rate and regular rhythm.     Pulses: Normal pulses.     Heart sounds: Normal heart sounds.  Pulmonary:     Effort: Pulmonary effort is normal.     Breath sounds: Normal breath sounds.  Abdominal:     General: Bowel sounds are normal.     Palpations: Abdomen is soft.  Musculoskeletal:     Cervical back: Neck supple. No tenderness.     Right lower leg: No edema.     Left lower leg: No edema.  Neurological:     General: No focal deficit present.     Mental Status: He is  alert.      No results found for any visits on 12/16/24.    The ASCVD Risk score (Arnett DK, et al., 2019) failed to calculate for the following reasons:   The 2019 ASCVD risk score is only valid for ages 12 to 47   * - Cholesterol units were assumed    Assessment & Plan:   Assessment & Plan Orthostatic hypotension Dizziness described as a 'swimmy head' sensation, primarily when walking, suggestive of orthostatic hypotension. Blood pressure drops upon standing, contributing to dizziness. Current medication regimen may contribute to symptoms. - Reduce amlodipine  dose to 2.5 mg daily and update us  soon on standing BP. - Check standing blood pressure three times a week and keep a record. - Advised on careful salt intake and avoiding measurement when agitated.  Diabetes mellitus with stage 3 chronic kidney disease (HCC) Difficulty maintaining blood glucose levels after reduction of metformin dosage from 2000 mg to 1000 mg daily due to kidney function concerns. Blood glucose levels spike significantly postprandially, with occasional readings as high as 247 mg/dL. Previous self-adjustment to 2000 mg during holidays due to difficulty controlling glucose levels. - Repeated blood work to assess current status. - Will consider alternative medications for blood glucose control due to kidney function concerns. Orders:   Hemoglobin A1c   Comprehensive metabolic panel with GFR   CBC with Differential/Platelet  Chronic venous insufficiency Properly fitted support stockings as tolerated.    Dyslipidemia Continue Zetia . Orders:   Lipid panel  Fatigue, unspecified type Mild.  Evaluate labs. 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 - Continue using Lotrisone  cream and medicated powder to maintain dryness and reduce symptoms. Orders:   clotrimazole -betamethasone  (LOTRISONE ) cream; Apply 1  Application topically 2 (two) times daily.     Return in about 4 weeks (around 01/13/2025) for chronic follow-up.    REDDING PONCE NORLEEN FALCON., MD     [1]  Social History Tobacco Use   Smoking status: Never   Smokeless tobacco: Never  Vaping Use   Vaping status: Never Used  Substance Use Topics   Alcohol use: Never   Drug use: Never   "

## 2024-12-17 ENCOUNTER — Telehealth (HOSPITAL_BASED_OUTPATIENT_CLINIC_OR_DEPARTMENT_OTHER): Payer: Self-pay | Admitting: *Deleted

## 2024-12-17 ENCOUNTER — Ambulatory Visit (HOSPITAL_BASED_OUTPATIENT_CLINIC_OR_DEPARTMENT_OTHER): Payer: Self-pay | Admitting: Family Medicine

## 2024-12-17 LAB — CBC WITH DIFFERENTIAL/PLATELET
Basophils Absolute: 0.1 x10E3/uL (ref 0.0–0.2)
Basos: 1 %
EOS (ABSOLUTE): 0.2 x10E3/uL (ref 0.0–0.4)
Eos: 2 %
Hematocrit: 44.4 % (ref 37.5–51.0)
Hemoglobin: 14.8 g/dL (ref 13.0–17.7)
Immature Grans (Abs): 0.1 x10E3/uL (ref 0.0–0.1)
Immature Granulocytes: 1 %
Lymphocytes Absolute: 1.8 x10E3/uL (ref 0.7–3.1)
Lymphs: 16 %
MCH: 34.4 pg — ABNORMAL HIGH (ref 26.6–33.0)
MCHC: 33.3 g/dL (ref 31.5–35.7)
MCV: 103 fL — ABNORMAL HIGH (ref 79–97)
Monocytes Absolute: 0.8 x10E3/uL (ref 0.1–0.9)
Monocytes: 7 %
Neutrophils Absolute: 8.4 x10E3/uL — ABNORMAL HIGH (ref 1.4–7.0)
Neutrophils: 73 %
Platelets: 265 x10E3/uL (ref 150–450)
RBC: 4.3 x10E6/uL (ref 4.14–5.80)
RDW: 12.1 % (ref 11.6–15.4)
WBC: 11.3 x10E3/uL — ABNORMAL HIGH (ref 3.4–10.8)

## 2024-12-17 LAB — COMPREHENSIVE METABOLIC PANEL WITH GFR
ALT: 9 IU/L (ref 0–44)
AST: 13 IU/L (ref 0–40)
Albumin: 4.2 g/dL (ref 3.6–4.6)
Alkaline Phosphatase: 91 IU/L (ref 48–129)
BUN/Creatinine Ratio: 18 (ref 10–24)
BUN: 34 mg/dL (ref 10–36)
Bilirubin Total: 0.5 mg/dL (ref 0.0–1.2)
CO2: 20 mmol/L (ref 20–29)
Calcium: 9.8 mg/dL (ref 8.6–10.2)
Chloride: 97 mmol/L (ref 96–106)
Creatinine, Ser: 1.84 mg/dL — ABNORMAL HIGH (ref 0.76–1.27)
Globulin, Total: 2.3 g/dL (ref 1.5–4.5)
Glucose: 112 mg/dL — ABNORMAL HIGH (ref 70–99)
Potassium: 5.1 mmol/L (ref 3.5–5.2)
Sodium: 135 mmol/L (ref 134–144)
Total Protein: 6.5 g/dL (ref 6.0–8.5)
eGFR: 34 mL/min/1.73 — ABNORMAL LOW

## 2024-12-17 LAB — VITAMIN B12: Vitamin B-12: 1205 pg/mL (ref 232–1245)

## 2024-12-17 LAB — LIPID PANEL
Chol/HDL Ratio: 3 ratio (ref 0.0–5.0)
Cholesterol, Total: 144 mg/dL (ref 100–199)
HDL: 48 mg/dL
LDL Chol Calc (NIH): 78 mg/dL (ref 0–99)
Triglycerides: 95 mg/dL (ref 0–149)
VLDL Cholesterol Cal: 18 mg/dL (ref 5–40)

## 2024-12-17 LAB — TSH: TSH: 2.81 u[IU]/mL (ref 0.450–4.500)

## 2024-12-17 LAB — HEMOGLOBIN A1C
Est. average glucose Bld gHb Est-mCnc: 154 mg/dL
Hgb A1c MFr Bld: 7 % — ABNORMAL HIGH (ref 4.8–5.6)

## 2024-12-17 NOTE — Telephone Encounter (Signed)
 LMOM

## 2024-12-18 ENCOUNTER — Other Ambulatory Visit (HOSPITAL_BASED_OUTPATIENT_CLINIC_OR_DEPARTMENT_OTHER): Payer: Self-pay | Admitting: Family Medicine

## 2024-12-18 ENCOUNTER — Telehealth (HOSPITAL_BASED_OUTPATIENT_CLINIC_OR_DEPARTMENT_OTHER): Payer: Self-pay | Admitting: Family Medicine

## 2024-12-18 DIAGNOSIS — E1122 Type 2 diabetes mellitus with diabetic chronic kidney disease: Secondary | ICD-10-CM

## 2024-12-18 DIAGNOSIS — N189 Chronic kidney disease, unspecified: Secondary | ICD-10-CM

## 2024-12-18 MED ORDER — GLIMEPIRIDE 2 MG PO TABS: 2.0000 mg | ORAL_TABLET | Freq: Every day | ORAL | 2 refills | Status: AC

## 2024-12-18 NOTE — Telephone Encounter (Signed)
 Copied from CRM #[id number]. Topic: Referral - Status >> Dec 18, 2024  1:22 PM [name] wrote: Reason for CRM: Patient returning call for update on referral for Nephrology,provided  Phillip Neal, [phone]

## 2024-12-20 ENCOUNTER — Telehealth (HOSPITAL_BASED_OUTPATIENT_CLINIC_OR_DEPARTMENT_OTHER): Payer: Self-pay | Admitting: Family Medicine

## 2024-12-20 NOTE — Telephone Encounter (Signed)
 Copied from CRM #[id number]. Topic: General - Other >> Dec 20, 2024 11:07 AM Donna BRAVO wrote: Reason for CRM: Patient reporting his BP to Dr Dottie MD. Started new medication on Tuesday 12/17/24  BP standing up  01/20        9:30am   152/72 01/20        2:30pm   149/73                  9pm        136/67 01/21        9am        129/70                  2pm        119/64                  6:30pm   125/71 01/22        8:15am    151/82                  3pm         78/53 was working in shead felt a little dizzy                   3:10pm    sitting down 117/63  heart rate 91                  3:30pm    sitting down 125/59  hart rate 78                  6:30pm    sitting down  128/73 hart rate 85                  8:45pm    standing up  142/71  hart rate 79 01/23       10:50am   standing 148/77  hart rate 74  Glucose 12/20/24   11:11am    82 have not taken the tablet and eat breakfast now Patient has no symptoms and said the new medication seems to be working.    Please call patient if there are any question

## 2024-12-25 ENCOUNTER — Telehealth (HOSPITAL_BASED_OUTPATIENT_CLINIC_OR_DEPARTMENT_OTHER): Payer: Self-pay | Admitting: Family Medicine

## 2024-12-25 NOTE — Telephone Encounter (Signed)
 Copied from CRM #[id number]. Topic: Clinical - Lab/Test Results >> Dec 25, 2024  3:40 PM Kevelyn M wrote: Reason for CRM: Patient calling about whether or not he needs to see Dr. Dottie this week per Dr. Briant message. He has an appointment on Feb 16th. Patient is having issues with MYCHART. Please advise.  Call back: [phone]

## 2024-12-26 NOTE — Telephone Encounter (Signed)
 Pt is calling back because he needs to know if he needs to been seen again by Dr. Dottie.

## 2024-12-27 ENCOUNTER — Other Ambulatory Visit (HOSPITAL_BASED_OUTPATIENT_CLINIC_OR_DEPARTMENT_OTHER): Payer: Self-pay | Admitting: Family Medicine

## 2024-12-27 NOTE — Telephone Encounter (Signed)
 Pt advised and voices understanding.

## 2025-01-03 ENCOUNTER — Ambulatory Visit (HOSPITAL_BASED_OUTPATIENT_CLINIC_OR_DEPARTMENT_OTHER): Admitting: Family Medicine

## 2025-01-03 ENCOUNTER — Emergency Department (HOSPITAL_BASED_OUTPATIENT_CLINIC_OR_DEPARTMENT_OTHER)
Admission: EM | Admit: 2025-01-03 | Discharge: 2025-01-03 | Disposition: A | Discharge status: Home / Self Care | Source: Home / Self Care | Attending: Emergency Medicine | Admitting: Emergency Medicine

## 2025-01-03 ENCOUNTER — Other Ambulatory Visit: Payer: Self-pay

## 2025-01-03 ENCOUNTER — Encounter (HOSPITAL_BASED_OUTPATIENT_CLINIC_OR_DEPARTMENT_OTHER): Payer: Self-pay | Admitting: Family Medicine

## 2025-01-03 ENCOUNTER — Encounter (HOSPITAL_BASED_OUTPATIENT_CLINIC_OR_DEPARTMENT_OTHER): Payer: Self-pay

## 2025-01-03 ENCOUNTER — Ambulatory Visit: Payer: Self-pay

## 2025-01-03 ENCOUNTER — Emergency Department (HOSPITAL_BASED_OUTPATIENT_CLINIC_OR_DEPARTMENT_OTHER)

## 2025-01-03 VITALS — BP 153/77 | HR 65 | Temp 97.9°F | Resp 16 | Wt 217.2 lb

## 2025-01-03 DIAGNOSIS — N179 Acute kidney failure, unspecified: Secondary | ICD-10-CM

## 2025-01-03 DIAGNOSIS — M7989 Other specified soft tissue disorders: Secondary | ICD-10-CM

## 2025-01-03 DIAGNOSIS — R600 Localized edema: Secondary | ICD-10-CM

## 2025-01-03 LAB — CBC
HCT: 45.5 % (ref 39.0–52.0)
Hemoglobin: 14.8 g/dL (ref 13.0–17.0)
MCH: 33.6 pg (ref 26.0–34.0)
MCHC: 32.5 g/dL (ref 30.0–36.0)
MCV: 103.2 fL — ABNORMAL HIGH (ref 80.0–100.0)
Platelets: 240 10*3/uL (ref 150–400)
RBC: 4.41 MIL/uL (ref 4.22–5.81)
RDW: 12.7 % (ref 11.5–15.5)
WBC: 10.8 10*3/uL — ABNORMAL HIGH (ref 4.0–10.5)
nRBC: 0 % (ref 0.0–0.2)

## 2025-01-03 LAB — BASIC METABOLIC PANEL WITH GFR
Anion gap: 12 (ref 5–15)
BUN: 31 mg/dL — ABNORMAL HIGH (ref 8–23)
CO2: 24 mmol/L (ref 22–32)
Calcium: 9.9 mg/dL (ref 8.9–10.3)
Chloride: 104 mmol/L (ref 98–111)
Creatinine, Ser: 1.6 mg/dL — ABNORMAL HIGH (ref 0.61–1.24)
GFR, Estimated: 40 mL/min — ABNORMAL LOW
Glucose, Bld: 100 mg/dL — ABNORMAL HIGH (ref 70–99)
Potassium: 4.7 mmol/L (ref 3.5–5.1)
Sodium: 140 mmol/L (ref 135–145)

## 2025-01-03 MED ORDER — CEPHALEXIN 250 MG PO CAPS: 250.0000 mg | ORAL_CAPSULE | Freq: Two times a day (BID) | ORAL | 0 refills | Status: AC

## 2025-01-03 MED ORDER — CEPHALEXIN 250 MG PO CAPS
250.0000 mg | ORAL_CAPSULE | Freq: Once | ORAL | Status: AC
  Administered 2025-01-03: 250 mg via ORAL
  Filled 2025-01-03: qty 1

## 2025-01-03 NOTE — ED Provider Notes (Incomplete)
 I provided a substantive portion of the care of this patient.  I personally made/approved the management plan for this patient and take responsibility for the patient management. {Remember to document shared critical care using "edcritical" dot phrase:1}

## 2025-01-03 NOTE — ED Provider Notes (Incomplete)
 " [place] EMERGENCY DEPARTMENT AT [facility] [place] Provider Note   CSN: [id number] Arrival date & time: 01/03/25  1703     Patient presents with: Foot Swelling   Phillip Neal is a 89 y.o. male who presents to the emergency department for chief complaint of foot swelling  {Add pertinent medical, surgical, social history, OB history to HPI:32947} HPI     Prior to Admission medications  Medication Sig Start Date End Date Taking? Authorizing Provider  Accu-Chek FastClix Lancets MISC Apply 1 each topically daily. 11/27/24   [provider]  ACCU-CHEK GUIDE TEST test strip 1 each daily. 12/05/24   [provider]  allopurinol (ZYLOPRIM) 100 MG tablet Take 1 tablet by mouth daily. 12/29/23   [provider]  amLODipine  (NORVASC ) 2.5 MG tablet Take 2.5 mg by mouth daily.    [provider]  apixaban  (ELIQUIS ) 2.5 MG TABS tablet TAKE 1 TABLET BY MOUTH TWICE  DAILY 10/22/24   Krasowski, Robert J, MD  Cholecalciferol (VITAMIN D3) 25 MCG (1000 UT) CAPS Take 1 capsule by mouth daily.    [provider]  clotrimazole -betamethasone  (LOTRISONE ) cream Apply 1 Application topically 2 (two) times daily.    [provider]  clotrimazole -betamethasone  (LOTRISONE ) cream Apply 1 Application topically 2 (two) times daily. 12/16/24   Dottie Norleen MANO II, MD  Coenzyme Q10 (CO Q 10) 100 MG CAPS Take 100 mg by mouth 2 (two) times daily.    [provider]  dicyclomine (BENTYL) 10 MG capsule Take 10 mg by mouth as needed for cramping. 04/30/21   [provider]  ezetimibe  (ZETIA ) 10 MG tablet Take 10 mg by mouth daily.    [provider]  fluconazole (DIFLUCAN) 200 MG tablet Take 200 mg by mouth once a week.    [provider]  fluticasone  (FLONASE ) 50 MCG/ACT nasal spray Place 2 sprays into both nostrils daily as needed for allergies or rhinitis. Patient not taking: Reported on 11/12/2024    [provider]   furosemide  (LASIX ) 20 MG tablet Take 1 tablet (20 mg total) by mouth daily. 10/23/24   Krasowski, Robert J, MD  glimepiride  (AMARYL ) 2 MG tablet Take 1 tablet (2 mg total) by mouth daily before breakfast. 12/18/24   Dottie Norleen MANO II, MD  lisinopril (PRINIVIL,ZESTRIL) 40 MG tablet Take 40 mg by mouth daily.  09/19/17   [provider]  vitamin B-12 (CYANOCOBALAMIN) 1000 MCG tablet Take 1,000 mcg by mouth daily.    [provider]    Allergies: Sulfa antibiotics, Ciprofloxacin, Other, Statins, and Sulfamethoxazole    Review of Systems  Updated Vital Signs BP (!) 152/86 (BP Location: Right Arm)   Pulse 69   Temp 98 F (36.7 C) (Oral)   Resp 16   Ht 5' 8 (1.727 m)   Wt 98.5 kg   SpO2 99%   BMI 33.02 kg/m   Physical Exam  (all labs ordered are listed, but only abnormal results are displayed) Labs Reviewed  CBC - Abnormal; Notable for the following components:      Result Value   WBC 10.8 (*)    MCV 103.2 (*)    All other components within normal limits  BASIC METABOLIC PANEL WITH GFR - Abnormal; Notable for the following components:   Glucose, Bld 100 (*)    BUN 31 (*)    Creatinine, Ser 1.60 (*)    GFR, Estimated 40 (*)    All other components within normal limits  EKG: None  Radiology: US  Venous Img Lower Unilateral Left Result Date: 01/03/2025 EXAM: ULTRASOUND DUPLEX OF THE LEFT LOWER EXTREMITY VEINS TECHNIQUE: Duplex ultrasound using B-mode/gray scaled imaging and Doppler spectral analysis and color flow was obtained of the deep venous structures of the left lower extremity. COMPARISON: None available. CLINICAL HISTORY: red, swollem, warm LLE Red, swollen, warm left lower extremity. FINDINGS: The common femoral vein, femoral vein, popliteal vein, and posterior tibial vein demonstrate normal compressibility with normal color flow and spectral analysis. Extensive subcutaneous edema noted within the visualized images of the left foot. IMPRESSION: 1. No  evidence of deep venous thrombosis. 2. Extensive subcutaneous edema in the left foot. Electronically signed by: Dorethia Molt MD 01/03/2025 08:08 PM EST RP Workstation: [id number]    {Document cardiac monitor, telemetry assessment procedure when appropriate:32947} Procedures   Medications Ordered in the ED - No data to display    {Click here for ABCD2, HEART and other calculators REFRESH Note before signing:1}                              Medical Decision Making Amount and/or Complexity of Data Reviewed Labs: ordered.   ***  {Document critical care time when appropriate  Document review of labs and clinical decision tools ie [id number], etc  Document your independent review of radiology images and any outside records  Document your discussion with family members, caretakers and with consultants  Document social determinants of health affecting pt's care  Document your decision making why or why not admission, treatments were needed:32947:::1}   Final diagnoses:  None    ED Discharge Orders     None        "

## 2025-01-03 NOTE — Discharge Instructions (Signed)
 It was a pleasure taking care of you today.  Based on your history and physical exam I feel you are safe for discharge.  Today your lab work is reassuring.  Out of an abundance of caution I have sent a prescription called Keflex  to your pharmacy, please pick it up and take as prescribed for the next 5 days.  Please continue to take Tylenol , apply ice to the area, and elevate it as well to help with swelling and pain. Also recommend compression stockings. Please be very careful wearing shoes to not give yourself any type of wound.  Please make your primary care provider aware of your workup and all findings today.  Recommend follow-up with your primary care provider within 1 week for wound recheck.  If you experience worsening redness, pain traveling up your leg to your calf area, chest pain, shortness of breath, fever, chills, or other concerning symptom please return to the emergency department. If symptoms worsen recommend follow-up within 48 hours.

## 2025-01-03 NOTE — ED Notes (Signed)
 Patient transferred from waiting room to ED treatment room. Assuming pt care at this time.

## 2025-01-03 NOTE — ED Triage Notes (Signed)
 L foot swelling, redness, warmth for 5 days  Pedal pulse 2+, cap refill greater than 3 seconds

## 2025-01-03 NOTE — Telephone Encounter (Addendum)
 FYI Only or Action Required?: FYI only for provider: appointment scheduled on 01/03/25.  Patient was last seen in primary care on 12/16/2024 by Dottie Norleen PHEBE PONCE, MD.  Called Nurse Triage reporting Foot Pain.  Symptoms began several days ago.  Interventions attempted: Nothing.  Symptoms are: unchanged.  Triage Disposition: See Physician Within 24 Hours  Patient/caregiver understands and will follow disposition?: Yes  *Correction, last DVT was 07/2023  Reason for Disposition  [1] Redness of the skin AND [2] no fever  [1] Swollen foot AND [2] no fever  (Exceptions: Localized bump from bunions, calluses, insect bite, sting.)  Answer Assessment - Initial Assessment Questions Pt states that 7-8 months ago, he had a DVT on his left dorsal foot. States UC advised no intervention required and resolved in 3 weeks.   Now is experiencing same symptoms in same location x 6 days. Reports edema and redness, sore when walking.   1. ONSET: When did the pain start?      12/29/24 2. LOCATION: Where is the pain located?      Left foot 3. PAIN: How bad is the pain?    (Scale 1-10; or mild, moderate, severe)     Initially sharp, resolved. Now pain is with ambulation, described as sore. 5. CAUSE: What do you think is causing the foot pain?     Pt suspects clot 6. OTHER SYMPTOMS: Do you have any other symptoms? (e.g., leg pain, rash, fever, numbness)     Redness, edema  Protocols used: Foot Pain-A-AH

## 2025-01-03 NOTE — Progress Notes (Unsigned)
 "  Established Patient Office Visit  Subjective   Patient ID: Phillip Neal, male    DOB: [birth year]  Age: 89 y.o. MRN: [id number]  Chief Complaint  Patient presents with   lt. foot pain     Lt. Foot pain     Discussed the use of AI scribe software for clinical note transcription with the patient, who gave verbal consent to proceed.  History of Present Illness Phillip Neal is a 89 year old male with a history of peripheral vein blood clots who presents with a suspected blood clot in his left foot.  He has been experiencing pain and swelling in his left foot for the past five days, similar to a previous episode about a year ago. The pain is localized to the same area as the prior clot, with a noticeable bump present. He experiences pain when putting on or taking off a shoe, and the left foot is more swollen compared to the right.  In the past, he visited two emergency rooms for similar symptoms and was informed it was a clot in a peripheral vein. He was advised to let it resolve on its own and was not given pain medication. During the previous episode, the pain initially felt like 'a little streak of lightning' for about 24 hours.  He is currently taking Eliquis , a blood thinner, which he was not on during his previous episode. He is surprised to have developed another clot while on this medication.  No recent falls or trauma to the foot. His daughters have been urging him to seek medical attention, and he feels that his family would be reassured if a doctor evaluated his condition.    Past Medical History:  Diagnosis Date   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/11/2016   Bilateral hearing loss 08/04/2023   Cardiomyopathy (HCC) 01/08/2020   f/by Dr. Bernie   Chronic gout involving toe of left foot without tophus 12/27/2023   Chronic venous insufficiency 07/28/2022   CKD (chronic kidney disease), stage III (HCC) 01/11/2016   Class 1 obesity due to excess  calories with serious comorbidity and body mass index (BMI) of 33.0 to 33.9 in adult 03/22/2016   Diabetes mellitus with stage 3 chronic kidney disease (HCC) 01/11/2016   Essential hypertension 01/11/2016   GERD without esophagitis 05/01/2018   Gouty arthropathy 03/22/2016   History of atrial flutter 06/25/2024   Shcked 2025.     History of iron deficiency 05/28/2018   Formatting of this note might be different from the original. After surgeries. Follow. No longer anemic.   History of kidney stones    years ago, passed on own   Hypertension    Mixed hyperlipidemia 01/11/2016   Myalgia due to statin 08/13/2021   OA (osteoarthritis) of hip 01/31/2018   Onychomycosis 02/14/2022   Paroxysmal atrial flutter (HCC) 01/11/2024   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   Vitamin B12 deficiency 01/11/2016    Outpatient Encounter Medications as of 01/03/2025  Medication Sig   Accu-Chek FastClix Lancets MISC Apply 1 each topically daily.   ACCU-CHEK GUIDE TEST test strip 1 each daily.   allopurinol (ZYLOPRIM) 100 MG tablet Take 1 tablet by mouth daily.   amLODipine  (NORVASC ) 2.5 MG tablet Take 2.5 mg by mouth daily.   apixaban  (ELIQUIS ) 2.5 MG TABS tablet TAKE 1 TABLET BY MOUTH TWICE  DAILY   Cholecalciferol (VITAMIN D3) 25 MCG (1000 UT) CAPS Take 1 capsule by mouth daily.   clotrimazole -betamethasone  (LOTRISONE ) cream Apply  1 Application topically 2 (two) times daily.   clotrimazole -betamethasone  (LOTRISONE ) cream Apply 1 Application topically 2 (two) times daily.   Coenzyme Q10 (CO Q 10) 100 MG CAPS Take 100 mg by mouth 2 (two) times daily.   dicyclomine (BENTYL) 10 MG capsule Take 10 mg by mouth as needed for cramping.   ezetimibe  (ZETIA ) 10 MG tablet Take 10 mg by mouth daily.   fluconazole (DIFLUCAN) 200 MG tablet Take 200 mg by mouth once a week.   furosemide  (LASIX ) 20 MG tablet Take 1 tablet (20 mg total) by mouth daily.   glimepiride  (AMARYL ) 2 MG tablet Take 1  tablet (2 mg total) by mouth daily before breakfast.   lisinopril (PRINIVIL,ZESTRIL) 40 MG tablet Take 40 mg by mouth daily.    vitamin B-12 (CYANOCOBALAMIN) 1000 MCG tablet Take 1,000 mcg by mouth daily.   [DISCONTINUED] amLODipine  (NORVASC ) 5 MG tablet TAKE 1 TABLET BY MOUTH DAILY   [DISCONTINUED] metFORMIN (GLUCOPHAGE-XR) 500 MG 24 hr tablet Take 500 mg by mouth 2 (two) times daily with a meal.   fluticasone  (FLONASE ) 50 MCG/ACT nasal spray Place 2 sprays into both nostrils daily as needed for allergies or rhinitis. (Patient not taking: Reported on 11/12/2024)   No facility-administered encounter medications on file as of 01/03/2025.    Social History[1]  {History (Optional):23778}  ROS    Objective:     BP (!) 153/77 (BP Location: Right Arm, Patient Position: Standing, Cuff Size: Normal)   Pulse 65   Temp 97.9 F (36.6 C) (Oral)   Resp 16   Wt 217 lb 3.2 oz (98.5 kg)   SpO2 98%   BMI 32.17 kg/m  {Vitals History (Optional):23777}  Physical Exam Constitutional:      General: He is not in acute distress.    Appearance: Normal appearance.  HENT:     Head: Normocephalic.  Neck:     Vascular: No carotid bruit.  Cardiovascular:     Rate and Rhythm: Normal rate and regular rhythm.     Pulses: Normal pulses.     Heart sounds: Normal heart sounds.  Pulmonary:     Effort: Pulmonary effort is normal.     Breath sounds: Normal breath sounds.  Abdominal:     General: Bowel sounds are normal.     Palpations: Abdomen is soft.  Musculoskeletal:     Cervical back: Neck supple. No tenderness.     Right lower leg: No edema.     Left lower leg: No edema.  Neurological:     Mental Status: He is alert.      No results found for any visits on 01/03/25.  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 Previous similar episode occurred a year ago. Current episode started five days ago. Risk of life-threatening complications if clot dislodges. Decision  to increase Eliquis  dosage due to potential risk of clot dislodgement. Discussed risks of increased Eliquis  dosage, including potential kidney impact and risk of bleeding if a fall occurs. He is considering emergency room visit for ultrasound to confirm diagnosis. - Increased Eliquis  to 5 mg twice daily. - Ordered blood work to recheck kidney function. - Advised emergency room visit for ultrasound to confirm diagnosis. - Scheduled follow-up appointment on Monday.  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 Current management includes glimepiride  and metformin. Metformin discontinued due to adverse effects. Glimepiride  dosage adjustment discussed due to timing of medication intake. - Instructed to break glimepiride  in half and take half a tablet daily.  Orthostatic hypotension Previous dizziness. Dizziness improved after reducing amlodipine  dosage from 5 mg to 2.5 mg daily. - Continue amlodipine  at 2.5 mg daily.     No follow-ups on file.    REDDING PONCE NORLEEN FALCON., MD    [1]  Social History Tobacco Use   Smoking status: Never   Smokeless tobacco: Never  Vaping Use   Vaping status: Never Used  Substance Use Topics   Alcohol use: Never   Drug use: Never   "

## 2025-01-13 ENCOUNTER — Ambulatory Visit (HOSPITAL_BASED_OUTPATIENT_CLINIC_OR_DEPARTMENT_OTHER): Admitting: Family Medicine

## 2025-01-28 ENCOUNTER — Ambulatory Visit: Admitting: "Endocrinology
